# Patient Record
Sex: Male | Born: 1970 | Race: White | Hispanic: No | Smoking: Never smoker
Health system: Southern US, Community
[De-identification: ages and names within clinical notes are randomized; demographics above are authoritative.]

## PROBLEM LIST (undated history)

## (undated) DIAGNOSIS — F1010 Alcohol abuse, uncomplicated: Secondary | ICD-10-CM

## (undated) HISTORY — PX: KNEE SURGERY: SHX244

## (undated) HISTORY — PX: ANKLE SURGERY: SHX546

---

## 2016-05-22 DIAGNOSIS — L719 Rosacea, unspecified: Secondary | ICD-10-CM | POA: Insufficient documentation

## 2016-05-22 DIAGNOSIS — F419 Anxiety disorder, unspecified: Secondary | ICD-10-CM | POA: Insufficient documentation

## 2016-05-22 DIAGNOSIS — M25521 Pain in right elbow: Secondary | ICD-10-CM | POA: Insufficient documentation

## 2020-12-29 ENCOUNTER — Other Ambulatory Visit: Payer: Self-pay | Admitting: Chiropractic Medicine

## 2020-12-29 ENCOUNTER — Ambulatory Visit
Admission: RE | Admit: 2020-12-29 | Discharge: 2020-12-29 | Disposition: A | Discharge status: Home / Self Care | Payer: 59 | Source: Ambulatory Visit | Attending: Chiropractic Medicine | Admitting: Chiropractic Medicine

## 2020-12-29 DIAGNOSIS — T1490XA Injury, unspecified, initial encounter: Secondary | ICD-10-CM

## 2021-02-06 ENCOUNTER — Ambulatory Visit (HOSPITAL_COMMUNITY)
Admission: EM | Admit: 2021-02-06 | Discharge: 2021-02-06 | Disposition: A | Discharge status: Home / Self Care | Payer: No Payment, Other | Attending: Psychiatry | Admitting: Psychiatry

## 2021-02-06 ENCOUNTER — Other Ambulatory Visit: Payer: Self-pay

## 2021-02-06 DIAGNOSIS — Z1330 Encounter for screening examination for mental health and behavioral disorders, unspecified: Secondary | ICD-10-CM | POA: Insufficient documentation

## 2021-02-06 NOTE — BH Assessment (Signed)
Comprehensive Clinical Assessment (CCA) Screening, Triage and Referral Note  02/06/2021 Keith Miller [id number]  Chief Complaint:  Chief Complaint  Patient presents with  . Urgent Emergent Eval   Visit Diagnosis: F10.20, Alcohol use disorder, Moderate  Keith Miller is a 50 year old patient who was voluntarily brought to the [facility]) at the insistence of his sister and his son. Pt states, "I've had alcohol issues for a period of time. I went to rehab [at [facility]] for 1 month in October 2017 and it went well; I was able to not drink for several years. Recently I've been returning back to my old ways." He shares he also did 8 weeks of IOP after his stay at [facility]. Pt states he'd like referral information to assist him in focusing on why he drinks.  Pt denies SI at this time but acknowledges he's experienced SI in the past. He denies he's ever attempted to kill himself or that he's been hospitalized for mental health concerns. Pt denies he has a plan to kill himself at this time. Pt also denies current or a hx of HI or AVH.  Pt gave verbal consent for clinician to speak to his son, Keith Miller, who was in the waiting room. Keith Miller shared he is afraid of his father causing harm to either himself or others due to driving after he drinks. He shares that 3 weeks ago pt was drinking "a good bit" for a week straight and that one night he fell, got a minor concussion, and broke his nose. He states that, after this incident, he stopped drinking for 2 weeks, but that a friend passed away this week and he started drinking again. Pt's son states that, since the friend's passing, pt has been drinking every night; he shares pt also began drinking during the day today. Pt's son states, "I think he was happier when he wasn't drinking."  After clinician met with pt's son, pt requested to leave. Clinician requested time to get his paperwork together and for the NP to see him and  pt was agreeable, but a short time later pt was again saying he wanted to leave immediately, stating he needed to get his son home, as he has school tomorrow. Of note, pt was registered at 46 and pt began asking to leave shortly after 2000. Pt declined to wait to see the NP; clinician provided pt information re: the [facility]'s outpatient services and walk-in hours. Pt expressed an understanding and shared he had no more questions; pt was then taken to the lobby and left AMA.   Patient Reported Information How did you hear about [place]? Self   Referral name: Self   Referral phone number: 0 (N/A)  Whom do you see for routine medical problems? -- (N/A)   Practice/Facility Name: No data recorded  Practice/Facility Phone Number: No data recorded  Name of Contact: No data recorded  Contact Number: No data recorded  Contact Fax Number: No data recorded  Prescriber Name: No data recorded  Prescriber Address (if known): No data recorded What Is the Reason for Your Visit/Call Today? Pt states he has been drinking more regularly as of late; he has a hx of alcohol abuse  How Long Has This Been Causing You Problems? 1 wk - 1 month  Have You Recently Been in Any Inpatient Treatment (Hospital/Detox/Crisis Center/28-Day Program)? No   Name/Location of Program/Hospital:No data recorded  How Long Were You There? No data recorded  When Were You Discharged?  No data recorded Have You Ever Received Services From [facility] Before? No   Who Do You See at [facility]? No data recorded Have You Recently Had Any Thoughts About Hurting Yourself? No   Are You Planning to Commit Suicide/Harm Yourself At This time?  No  Have you Recently Had Thoughts About [place]? No   Explanation: No data recorded Have You Used Any Alcohol or Drugs in the Past 24 Hours? Yes   How Long Ago Did You Use Drugs or Alcohol?  No data recorded  What Did You Use and How Much? EtOH; unsure  What Do You Feel Would Help  You the Most Today? Therapy  Do You Currently Have a Therapist/Psychiatrist? No   Name of Therapist/Psychiatrist: No data recorded  Have You Been Recently Discharged From Any Office Practice or Programs? No   Explanation of Discharge From Practice/Program:  No data recorded    CCA Screening Triage Referral Assessment Type of Contact: Face-to-Face   Is this Initial or Reassessment? No data recorded  Date Telepsych consult ordered in [facility]:  No data recorded  Time Telepsych consult ordered in [facility]:  No data recorded Patient Reported Information Reviewed? Yes   Patient Left Without Being Seen? No data recorded  Reason for Not Completing Assessment: No data recorded Collateral Involvement: Pt gave verbal consent for clinician to speak to his son, Keith Miller; clinician spoke to pt's son and obtain collateral information.  Does Patient Have a Stage manager Guardian? No data recorded  Name and Contact of Legal Guardian:  No data recorded If Minor and Not Living with Parent(s), Who has Custody? N/A  Is CPS involved or ever been involved? Never  Is APS involved or ever been involved? Never  Patient Determined To Be At Risk for Harm To Self or Others Based on Review of Patient Reported Information or Presenting Complaint? No   Method: No data recorded  Availability of Means: No data recorded  Intent: No data recorded  Notification Required: No data recorded  Additional Information for Danger to Others Potential:  No data recorded  Additional Comments for Danger to Others Potential:  No data recorded  Are There Guns or Other Weapons in Your Home?  No data recorded   Types of Guns/Weapons: No data recorded   Are These Weapons Safely Secured?                              No data recorded   Who Could Verify You Are Able To Have These Secured:    No data recorded Do You Have any Outstanding Charges, Pending Court Dates, Parole/Probation? No data recorded Contacted To Inform of  Risk of Harm To Self or Others: -- (N/A)  Location of Assessment: [facility] Assessment Services  Does Patient Present under Involuntary Commitment? No   IVC Papers Initial File Date: No data recorded  [place] of Residence: [place]  Patient Currently Receiving the Following Services: Not Receiving Services   Determination of Need: No data recorded  Options For Referral: No data recorded  Dannielle Burn, LMFT

## 2021-02-06 NOTE — ED Provider Notes (Signed)
Behavioral Health Urgent Care Medical Screening Exam  Patient Name: Keith Miller MRN: [id number] Date of Evaluation: 02/06/21 Chief Complaint:   Diagnosis:  Final diagnoses:  None    History of Present illness: Keith Miller is a 50 y.o. male. Patient left prior to being seem by provider.   Per TTS: Pt denies SI at this time but acknowledges he's experienced SI in the past. He denies he's ever attempted to kill himself or that he's been hospitalized for mental health concerns. Pt denies he has a plan to kill himself at this time. Pt also denies current or a hx of HI or AVH.  No need to contact law enforcement for wellness check based on TTS assessment.   Maricela Bo, NP 02/06/2021, 11:29 PM

## 2021-02-14 ENCOUNTER — Other Ambulatory Visit: Payer: Self-pay | Admitting: Internal Medicine

## 2021-02-14 DIAGNOSIS — Z0000 Encounter for general adult medical examination without abnormal findings: Secondary | ICD-10-CM

## 2021-07-08 ENCOUNTER — Other Ambulatory Visit: Payer: Self-pay

## 2021-07-08 ENCOUNTER — Emergency Department (HOSPITAL_COMMUNITY)
Admission: EM | Admit: 2021-07-08 | Discharge: 2021-07-08 | Disposition: A | Discharge status: Home / Self Care | Payer: 59 | Attending: Emergency Medicine | Admitting: Emergency Medicine

## 2021-07-08 DIAGNOSIS — R45851 Suicidal ideations: Secondary | ICD-10-CM | POA: Diagnosis not present

## 2021-07-08 DIAGNOSIS — F32A Depression, unspecified: Secondary | ICD-10-CM | POA: Insufficient documentation

## 2021-07-08 DIAGNOSIS — Y908 Blood alcohol level of 240 mg/100 ml or more: Secondary | ICD-10-CM | POA: Insufficient documentation

## 2021-07-08 DIAGNOSIS — F1010 Alcohol abuse, uncomplicated: Secondary | ICD-10-CM | POA: Diagnosis not present

## 2021-07-08 DIAGNOSIS — F329 Major depressive disorder, single episode, unspecified: Secondary | ICD-10-CM

## 2021-07-08 DIAGNOSIS — Z20822 Contact with and (suspected) exposure to covid-19: Secondary | ICD-10-CM | POA: Insufficient documentation

## 2021-07-08 LAB — COMPREHENSIVE METABOLIC PANEL
ALT: 69 U/L — ABNORMAL HIGH (ref 0–44)
AST: 86 U/L — ABNORMAL HIGH (ref 15–41)
Albumin: 4.6 g/dL (ref 3.5–5.0)
Alkaline Phosphatase: 36 U/L — ABNORMAL LOW (ref 38–126)
Anion gap: 18 — ABNORMAL HIGH (ref 5–15)
BUN: 18 mg/dL (ref 6–20)
CO2: 22 mmol/L (ref 22–32)
Calcium: 8.7 mg/dL — ABNORMAL LOW (ref 8.9–10.3)
Chloride: 98 mmol/L (ref 98–111)
Creatinine, Ser: 1.03 mg/dL (ref 0.61–1.24)
GFR, Estimated: >60 mL/min (ref >60)
Glucose, Bld: 97 mg/dL (ref 70–99)
Potassium: 3.9 mmol/L (ref 3.5–5.1)
Sodium: 138 mmol/L (ref 135–145)
Total Bilirubin: 1.2 mg/dL (ref 0.3–1.2)
Total Protein: 7.4 g/dL (ref 6.5–8.1)

## 2021-07-08 LAB — RESP PANEL BY RT-PCR (FLU A&B, COVID) ARPGX2
Influenza A by PCR: NEGATIVE
Influenza B by PCR: NEGATIVE
SARS Coronavirus 2 by RT PCR: NEGATIVE

## 2021-07-08 LAB — RAPID URINE DRUG SCREEN, HOSP PERFORMED
Amphetamines: NOT DETECTED
Barbiturates: NOT DETECTED
Benzodiazepines: NOT DETECTED
Cocaine: NOT DETECTED
Opiates: NOT DETECTED
Tetrahydrocannabinol: NOT DETECTED

## 2021-07-08 LAB — CBC WITH DIFFERENTIAL/PLATELET
Abs Immature Granulocytes: 0.02 10*3/uL (ref 0.00–0.07)
Basophils Absolute: 0 10*3/uL (ref 0.0–0.1)
Basophils Relative: 1 %
Eosinophils Absolute: 0 10*3/uL (ref 0.0–0.5)
Eosinophils Relative: 0 %
HCT: 44.7 % (ref 39.0–52.0)
Hemoglobin: 15.8 g/dL (ref 13.0–17.0)
Immature Granulocytes: 0 %
Lymphocytes Relative: 13 %
Lymphs Abs: 1.1 10*3/uL (ref 0.7–4.0)
MCH: 31.7 pg (ref 26.0–34.0)
MCHC: 35.3 g/dL (ref 30.0–36.0)
MCV: 89.6 fL (ref 80.0–100.0)
Monocytes Absolute: 0.4 10*3/uL (ref 0.1–1.0)
Monocytes Relative: 5 %
Neutro Abs: 6.7 10*3/uL (ref 1.7–7.7)
Neutrophils Relative %: 81 %
Platelets: 286 10*3/uL (ref 150–400)
RBC: 4.99 MIL/uL (ref 4.22–5.81)
RDW: 13.1 % (ref 11.5–15.5)
WBC: 8.3 10*3/uL (ref 4.0–10.5)
nRBC: 0 % (ref 0.0–0.2)

## 2021-07-08 LAB — ETHANOL: Alcohol, Ethyl (B): 295 mg/dL — ABNORMAL HIGH (ref <10)

## 2021-07-08 MED ORDER — LORAZEPAM 1 MG PO TABS: 0.0000 mg | ORAL_TABLET | Freq: Four times a day (QID) | ORAL | Status: DC

## 2021-07-08 MED ORDER — CHLORDIAZEPOXIDE HCL 25 MG PO CAPS: ORAL_CAPSULE | ORAL | 0 refills | Status: DC

## 2021-07-08 MED ORDER — ESCITALOPRAM OXALATE 10 MG PO TABS
10.0000 mg | ORAL_TABLET | Freq: Every day | ORAL | Status: DC
  Administered 2021-07-08: 10 mg via ORAL
  Filled 2021-07-08: qty 1

## 2021-07-08 MED ORDER — LORAZEPAM 2 MG/ML IJ SOLN: 0.0000 mg | Freq: Two times a day (BID) | INTRAMUSCULAR | Status: DC

## 2021-07-08 MED ORDER — LORAZEPAM 2 MG/ML IJ SOLN
1.0000 mg | Freq: Once | INTRAMUSCULAR | Status: AC
  Administered 2021-07-08: 1 mg via INTRAVENOUS
  Filled 2021-07-08: qty 1

## 2021-07-08 MED ORDER — SODIUM CHLORIDE 0.9 % IV SOLN: Freq: Once | INTRAVENOUS | Status: AC

## 2021-07-08 MED ORDER — THIAMINE HCL 100 MG/ML IJ SOLN
100.0000 mg | Freq: Every day | INTRAMUSCULAR | Status: DC
  Administered 2021-07-08: 100 mg via INTRAVENOUS
  Filled 2021-07-08: qty 2

## 2021-07-08 MED ORDER — LORAZEPAM 2 MG/ML IJ SOLN: 0.0000 mg | Freq: Four times a day (QID) | INTRAMUSCULAR | Status: DC

## 2021-07-08 MED ORDER — THIAMINE HCL 100 MG PO TABS: 100.0000 mg | ORAL_TABLET | Freq: Every day | ORAL | Status: DC

## 2021-07-08 MED ORDER — LORAZEPAM 1 MG PO TABS: 0.0000 mg | ORAL_TABLET | Freq: Two times a day (BID) | ORAL | Status: DC

## 2021-07-08 NOTE — Discharge Instructions (Addendum)
As discussed, it is important that you drink alcohol only in moderation, monitor your condition carefully and do not hesitate to return here for concerning changes in your condition.  Otherwise follow-up with your physician and our behavioral health urgent care center to establish care with a mental health counselor.

## 2021-07-08 NOTE — ED Triage Notes (Addendum)
Pt requesting detox from ETOH. ETOH on board. No other complaints. VSS. Ambulatory in triage. CBG 88 w EMS.

## 2021-07-08 NOTE — ED Provider Notes (Signed)
[facility]-EMERGENCY DEPT Provider Note   CSN: [id number] Arrival date & time: 07/08/21  2013     History Chief Complaint  Patient presents with   Alcohol Intoxication   Requesting Detox    Keith Miller is a 50 y.o. male.  HPI Patient presents with request for assistance with alcohol abuse, depression. He notes that he is generally well, was down to about 1 week ago.  Not long after losing his job he began drinking excessively.  No drug use, cigarette use.  Since onset about 1 week ago he has been drinking copious amounts of alcohol and has developed despondency.  No discrete plan.  No focal pain.  He has been taking escitalopram as directed.  No clear alleviating or exacerbating factors.    Past medical depression, on escitalopram Social: Recent job loss, alcohol intake, no cigarettes, no drugs.  No family history on file.     Home Medications Prior to Admission medications   Not on File    Allergies    Patient has no allergy information on record.  Review of Systems   Review of Systems  Constitutional:        Per HPI, otherwise negative  HENT:         Per HPI, otherwise negative  Respiratory:         Per HPI, otherwise negative  Cardiovascular:        Per HPI, otherwise negative  Gastrointestinal:  Negative for vomiting.  Endocrine:       Negative aside from HPI  Genitourinary:        Neg aside from HPI   Musculoskeletal:        Per HPI, otherwise negative  Skin: Negative.   Neurological:  Negative for syncope.  Psychiatric/Behavioral:  Positive for dysphoric mood and suicidal ideas.    Physical Exam Updated Vital Signs BP (!) 154/96 (BP Location: Left Arm)   Pulse (!) 116   Temp 98.8 F (37.1 C) (Oral)   Resp 18   SpO2 96%   Physical Exam Vitals and nursing note reviewed.  Constitutional:      General: He is not in acute distress.    Appearance: He is well-developed.  HENT:     Head: Normocephalic and atraumatic.   Eyes:     Conjunctiva/sclera: Conjunctivae normal.  Cardiovascular:     Rate and Rhythm: Normal rate and regular rhythm.  Pulmonary:     Effort: Pulmonary effort is normal. No respiratory distress.     Breath sounds: No stridor.  Abdominal:     General: There is no distension.  Skin:    General: Skin is warm and dry.  Neurological:     Mental Status: He is alert and oriented to person, place, and time.  Psychiatric:     Comments: Depressed, but states that I would never commit suicide.    ED Results / Procedures / Treatments   Labs (all labs ordered are listed, but only abnormal results are displayed) Labs Reviewed  RESP PANEL BY RT-PCR (FLU A&B, COVID) [id number]  COMPREHENSIVE METABOLIC PANEL  ETHANOL  RAPID URINE DRUG SCREEN, HOSP PERFORMED  CBC WITH DIFFERENTIAL/PLATELET    EKG None  Radiology No results found.  Procedures Procedures   Medications Ordered in ED Medications  LORazepam (ATIVAN) injection 0-4 mg (has no administration in time range)    Or  LORazepam (ATIVAN) tablet 0-4 mg (has no administration in time range)  LORazepam (ATIVAN) injection 0-4 mg (has no administration  in time range)    Or  LORazepam (ATIVAN) tablet 0-4 mg (has no administration in time range)  thiamine tablet 100 mg (has no administration in time range)    Or  thiamine (B-1) injection 100 mg (has no administration in time range)  0.9 %  sodium chloride infusion (has no administration in time range)    ED Course  I have reviewed the triage vital signs and the nursing notes.  Pertinent labs & imaging results that were available during my care of the patient were reviewed by me and considered in my medical decision making (see chart for details).  Healthy adult male presents 1 week after job loss, now with excessive alcohol intake and new suicidal ideation.  He is awake, alert, speaking clearly, insightful into his presentation, likely precipitant of substantial life stress.   Patient had labs, started on CIWA protocol, received fluids.  11:02 PM Patient feeling better.  He has received fluids, Ativan.  He again reiterates that he is not interested in killing himself, acknowledges being in a difficult position in his life. We discussed importance of following up with behavioral health counselors, and alcohol cessation.  Patient amenable to starting Librium. Labs most notable for elevated alcohol level, the patient will have a ride home, but once he has fluids, Ativan, may be appropriate for discharge with outpatient behavioral follow-up.  Final Clinical Impression(s) / ED Diagnoses Final diagnoses:  ETOH abuse  Despondency     Gerhard Munch, MD 07/08/21 2304

## 2021-11-09 ENCOUNTER — Encounter (HOSPITAL_COMMUNITY): Payer: Self-pay | Admitting: Oncology

## 2021-11-09 ENCOUNTER — Emergency Department (HOSPITAL_COMMUNITY)
Admission: EM | Admit: 2021-11-09 | Discharge: 2021-11-09 | Disposition: A | Discharge status: Home / Self Care | Payer: 59 | Attending: Emergency Medicine | Admitting: Emergency Medicine

## 2021-11-09 ENCOUNTER — Other Ambulatory Visit: Payer: Self-pay

## 2021-11-09 DIAGNOSIS — R110 Nausea: Secondary | ICD-10-CM | POA: Insufficient documentation

## 2021-11-09 DIAGNOSIS — F1090 Alcohol use, unspecified, uncomplicated: Secondary | ICD-10-CM | POA: Diagnosis present

## 2021-11-09 DIAGNOSIS — Z5321 Procedure and treatment not carried out due to patient leaving prior to being seen by health care provider: Secondary | ICD-10-CM | POA: Insufficient documentation

## 2021-11-09 NOTE — ED Notes (Signed)
Called a total of 3 times.  Will d/c.

## 2021-11-09 NOTE — ED Notes (Signed)
Pt is not in triage room or lobby.  Will call for pt again.

## 2021-11-09 NOTE — ED Provider Notes (Signed)
Emergency Medicine Provider Triage Evaluation Note  Keith Miller , a 50 y.o. male  was evaluated in triage.  Pt for etoh detox. Last drink was 9 AM. Denies SI or HI. Denies AVH.   Review of Systems  Positive: nausea Negative: vomiting  Physical Exam  BP 120/77 (BP Location: Right Arm)   Pulse 79   Temp 98.3 F (36.8 C) (Oral)   Resp 17   Ht 6' (1.829 m)   Wt 90.7 kg   SpO2 99%   BMI 27.12 kg/m  Gen:   Awake, no distress   Resp:  Normal effort  MSK:   Moves extremities without difficulty  Other:  Clear speech  Medical Decision Making  Medically screening exam initiated at 3:35 PM.  Appropriate orders placed.  Keith Miller was informed that the remainder of the evaluation will be completed by another provider, this initial triage assessment does not replace that evaluation, and the importance of remaining in the ED until their evaluation is complete.     Keith Miller 11/09/21 1536    Keith Core, MD 11/10/21 [phone]

## 2021-11-09 NOTE — ED Triage Notes (Signed)
Pt reports binge drinking 3-4 half pints of vodka daily x one week.  Pt's last drink was this morning. Pt states he has had binges in the past however feels like he cannot come out of this one without assistance. Denies seizures in the past when abstaining from ETOH.

## 2021-11-11 ENCOUNTER — Ambulatory Visit (HOSPITAL_COMMUNITY)
Admission: EM | Admit: 2021-11-11 | Discharge: 2021-11-11 | Disposition: A | Discharge status: Home / Self Care | Payer: No Payment, Other | Attending: Psychiatry | Admitting: Psychiatry

## 2021-11-11 DIAGNOSIS — R450 Nervousness: Secondary | ICD-10-CM | POA: Insufficient documentation

## 2021-11-11 DIAGNOSIS — F1024 Alcohol dependence with alcohol-induced mood disorder: Secondary | ICD-10-CM | POA: Insufficient documentation

## 2021-11-11 DIAGNOSIS — F32A Depression, unspecified: Secondary | ICD-10-CM | POA: Insufficient documentation

## 2021-11-11 DIAGNOSIS — R4586 Emotional lability: Secondary | ICD-10-CM | POA: Insufficient documentation

## 2021-11-11 DIAGNOSIS — F1910 Other psychoactive substance abuse, uncomplicated: Secondary | ICD-10-CM | POA: Insufficient documentation

## 2021-11-11 NOTE — ED Provider Notes (Addendum)
Behavioral Health Urgent Care Medical Screening Exam  Patient Name: Keith Miller MRN: [id number] Date of Evaluation: 11/11/21 Chief Complaint:   Diagnosis:  Final diagnoses:  Alcohol dependence with alcohol-induced mood disorder (HCC)    History of Present illness: Keith Miller is a 50 y.o. male presents to [facility] urgent care accompanied by his sister.  Family is requesting that patient gets help to detox from alcohol.  Keith Miller reports drinking vodka daily. "  I do not know how much."  denying suicidal homicidal ideations.  Denies auditory visual hallucinations.  Denies history of delirious tremors or seizure withdrawal history.     Keith Miller was offered inpatient admission at our facility based crisis unit [facility])  however he declined.  Reported " I can not stay here for 5 days, I feel like shit, I just need something right now to help me."  Patient presents very irritable and labile.  NP attempted to discuss admission to facility based crisis in regards to detox/CIWA protocol.  Patient left this assessment.  We will make additional outpatient resources available.  Keith Miller, 50 y.o., male patient seen face to face by this provider and chart reviewed on 11/11/21.  On evaluation Keith Miller chart review patient denied that he is followed up with outpatient resources that he was provided in July.  Keith Miller reported " y all are the fucking professionals, y'all don't know what I need?"  Patient's sister apologized profusely stating that she never experienced patient acting this belligerent.  During evaluation Keith Miller is sitting  in no acute distress. He is alert/oriented x 4; irritable and labile; mood congruent with affect. He is speaking in a clear tone at moderate volume, and normal pace; with minimal eye contact.  His thought process is coherent and relevant; There is no indication that he is currently responding to internal/external stimuli or experiencing delusional  thought content; and he has denied suicidal/self-harm/homicidal ideation, psychosis, and paranoia.    At this time Keith Miller is educated and verbalizes understanding of mental health resources and other crisis services in the community. He is instructed to call 911 and present to the nearest emergency room should he experience any suicidal/homicidal ideation, auditory/visual/hallucinations, or detrimental worsening of his mental health condition. He was a also advised by Clinical research associate that he could call the toll-free phone on insurance card to assist with identifying in network counselors and agencies or number on back of Medicaid card to speak with care coordinator    Psychiatric Specialty Exam  Presentation  General Appearance:Appropriate for Environment  Eye Contact:Good  Speech:Clear and Coherent  Speech Volume:Normal  Handedness:Right   Mood and Affect  Mood:Depressed; Irritable; Labile  Affect:Congruent   Thought Process  Thought Processes:Coherent  Descriptions of Associations:Intact  Orientation:Full (Time, Place and Person)  Thought Content:Logical    Hallucinations:None  Ideas of Reference:None  Suicidal Thoughts:No  Homicidal Thoughts:No   Sensorium  Memory:Immediate Fair; Recent Fair; Remote Fair  Judgment:Fair  Insight:Fair   Executive Functions  Concentration:Fair  Attention Span:Fair  Recall:Fair  Fund of Knowledge:Fair  Language:Good   Psychomotor Activity  Psychomotor Activity:Normal   Assets  Assets:Desire for Improvement; Intimacy; Social Support   Sleep  Sleep:Fair  Number of hours: No data recorded  Nutritional Assessment (For OBS and FBC admissions only) Has the patient had a weight loss or gain of 10 pounds or more in the last 3 months?: No Has the patient had a decrease in food intake/or appetite?: No Does the patient  have dental problems?: No Does the patient have eating habits or behaviors that may be indicators  of an eating disorder including binging or inducing vomiting?: No Has the patient recently lost weight without trying?: 0 Has the patient been eating poorly because of a decreased appetite?: 0 Malnutrition Screening Tool Score: 0   Physical Exam: Physical Exam Vitals reviewed.  HENT:     Head: Normocephalic.  Pulmonary:     Effort: Pulmonary effort is normal.     Breath sounds: Normal breath sounds.  Neurological:     Mental Status: He is alert and oriented to person, place, and time.  Psychiatric:        Attention and Perception: Attention and perception normal.        Mood and Affect: Mood is anxious. Affect is labile.        Speech: Speech normal.        Behavior: Behavior is uncooperative and agitated.        Thought Content: Thought content normal. Thought content does not include suicidal ideation.        Cognition and Memory: Cognition normal.        Judgment: Judgment normal.   Review of Systems  HENT: Negative.    Eyes: Negative.   Cardiovascular: Negative.   Musculoskeletal: Negative.   Skin: Negative.   Endo/Heme/Allergies: Negative.   Psychiatric/Behavioral:  Positive for depression and substance abuse. Negative for suicidal ideas. The patient is nervous/anxious.   All other systems reviewed and are negative. Blood pressure 105/77, pulse 93, temperature 98.4 F (36.9 C), temperature source Oral, resp. rate 18, SpO2 99 %. There is no height or weight on file to calculate BMI.  Musculoskeletal: Strength & Muscle Tone: within normal limits Gait & Station: normal Patient leans: N/A   Jolly MSE Discharge Disposition for Follow up and Recommendations: Based on my evaluation the patient does not appear to have an emergency medical condition and can be discharged with resources and follow up care in outpatient services for Medication Management, Substance Abuse Intensive Outpatient Program, and Individual Therapy   Derrill [place], NP 11/11/2021, 4:35 PM

## 2021-11-11 NOTE — Discharge Instructions (Addendum)
Take all medications as prescribed. Keep all follow-up appointments as scheduled.  Do not consume alcohol or use illegal drugs while on prescription medications. Report any adverse effects from your medications to your primary care provider promptly.  In the event of recurrent symptoms or worsening symptoms, call 911, a crisis hotline, or go to the nearest emergency department for evaluation.   

## 2021-11-11 NOTE — Progress Notes (Signed)
Patient presents this date impaired asking for ETOH resources. Patient is brought in by his sister who assists with collateral information. Patient states he has been consuming different amounts of alcohol daily "forever" rendering limited history in reference to extended time frame and amounts consumed. Patient denies any S/I, H/I or AVH. Patient is observed to be agitated and guarded in reference to his history. Sister states patient was in a program at [facility] in in 2016 and maintained his sobriety for over three years being actively involved in [place]. Patient relapsed in 2019 and sister reports daily use since then. Patient was evaluated by Melvyn Neth NP who offered patient a admission into [facility] although patient declined stating he just was interested in OP resources. Patient was provided with area providers to assist with ongoing needs.

## 2021-11-11 NOTE — Discharge Summary (Signed)
Keith Miller to be D/C'd Home per NP order. Discussed with the patient's sister and all questions fully answered. An After Visit Summary was printed and given to the patient's sister. Patient escorted out, and D/C home via private auto.  [name]  11/11/2021 4:24 PM

## 2021-11-12 ENCOUNTER — Emergency Department (HOSPITAL_COMMUNITY)
Admission: EM | Admit: 2021-11-12 | Discharge: 2021-11-12 | Discharge status: Against Medical Advice | Payer: 59 | Source: Home / Self Care

## 2021-11-12 ENCOUNTER — Other Ambulatory Visit: Payer: Self-pay

## 2021-11-12 ENCOUNTER — Ambulatory Visit (HOSPITAL_COMMUNITY)
Admission: EM | Admit: 2021-11-12 | Discharge: 2021-11-12 | Disposition: A | Discharge status: Home / Self Care | Payer: 59

## 2021-11-12 DIAGNOSIS — F10129 Alcohol abuse with intoxication, unspecified: Secondary | ICD-10-CM

## 2021-11-12 NOTE — ED Notes (Signed)
Discharge instructions provided and Pt stated understanding. Pt alert, orient and ambulatory prior to d/c from facility. Safety maintained.   

## 2021-11-12 NOTE — ED Provider Notes (Signed)
Behavioral Health Urgent Care Medical Screening Exam  Patient Name: Keith Miller MRN: [id number] Date of Evaluation: 11/12/21 Chief Complaint:   Diagnosis:  Final diagnoses:  Alcohol abuse with intoxication (HCC)    History of Present illness: Keith Miller is a 50 y.o. male.  Presents to [facility] urgent care accompanied by his family members.  Patient read presents tonight and family is requesting detox from alcohol.  Keith Miller is irritable, belligerent and slightly aggressive throughout this assessment.  Keith Miller has declined resources for substance abuse.  Education was provided with following up with [facility] and/or [facility] recovery services and/or when patient is more receptive to treatment facility.   Keith Miller appears intoxicated as he reports his last drink was earlier today.  Patient is unsteady gait, slurred speech and belligerent.  Patient is not easily redirectable  -Patient left this assessment for the second night.  Patient refused services  Psychiatric Specialty Exam  Presentation  General Appearance:Appropriate for Environment  Eye Contact:Good  Speech:Clear and Coherent  Speech Volume:Normal  Handedness:Right   Mood and Affect  Mood:Depressed; Irritable; Labile  Affect:Congruent   Thought Process  Thought Processes:Coherent  Descriptions of Associations:Intact  Orientation:Full (Time, Place and Person)  Thought Content:Logical    Hallucinations:None  Ideas of Reference:None  Suicidal Thoughts:No  Homicidal Thoughts:No   Sensorium  Memory:Immediate Fair; Recent Fair; Remote Fair  Judgment:Fair  Insight:Fair   Executive Functions  Concentration:Fair  Attention Span:Fair  Recall:Fair  Fund of Knowledge:Fair  Language:Good   Psychomotor Activity  Psychomotor Activity:Normal   Assets  Assets:Desire for Improvement; Intimacy; Social Support   Sleep  Sleep:Fair  Number of hours: No data  recorded  Nutritional Assessment (For OBS and FBC admissions only) Has the patient had a weight loss or gain of 10 pounds or more in the last 3 months?: No Has the patient had a decrease in food intake/or appetite?: No Does the patient have dental problems?: No Does the patient have eating habits or behaviors that may be indicators of an eating disorder including binging or inducing vomiting?: No Has the patient recently lost weight without trying?: 0 Has the patient been eating poorly because of a decreased appetite?: 0 Malnutrition Screening Tool Score: 0    Physical Exam: Physical Exam Vitals reviewed.  Cardiovascular:     Rate and Rhythm: Normal rate and regular rhythm.     Pulses: Normal pulses.  Neurological:     Mental Status: He is oriented to person, place, and time.  Psychiatric:        Attention and Perception: Attention normal.        Mood and Affect: Mood normal.        Speech: Speech is slurred.        Behavior: Behavior is uncooperative.        Thought Content: Thought content normal.        Cognition and Memory: Cognition normal.        Judgment: Judgment normal.   Review of Systems  HENT: Negative.    Eyes: Negative.   Cardiovascular: Negative.   Gastrointestinal: Negative.   Skin: Negative.   Neurological: Negative.   Psychiatric/Behavioral:  Positive for substance abuse.   All other systems reviewed and are negative. There were no vitals taken for this visit. There is no height or weight on file to calculate BMI.  Musculoskeletal: Strength & Muscle Tone: within normal limits Gait & Station: normal Patient leans: N/A   [facility] MSE Discharge Disposition for  Follow up and Recommendations: Based on my evaluation the patient does not appear to have an emergency medical condition and can be discharged with resources and follow up care in outpatient services for Substance Abuse Intensive Outpatient Program   Oneta Rack, NP 11/12/2021, 4:55 PM

## 2021-11-12 NOTE — Discharge Instructions (Addendum)
Take all medications as prescribed. Keep all follow-up appointments as scheduled.  Do not consume alcohol or use illegal drugs while on prescription medications. Report any adverse effects from your medications to your primary care provider promptly.  In the event of recurrent symptoms or worsening symptoms, call 911, a crisis hotline, or go to the nearest emergency department for evaluation.   

## 2021-11-12 NOTE — BH Assessment (Signed)
Jens Siems, Urgent, MR [id number]; 51 years old presents this date with his sister, Felton Clinton, [phone].  Pt denied SI, HI, or AVH.  Pt sister reports that he using alcohol and needs detox.  Pt sister denies any prior MH diagnosis or prescribed medication for symptom management.  MSE signed by patient's sister.

## 2021-11-13 ENCOUNTER — Ambulatory Visit (HOSPITAL_COMMUNITY)
Admission: EM | Admit: 2021-11-13 | Discharge: 2021-11-13 | Disposition: A | Discharge status: Home / Self Care | Payer: 59 | Attending: Registered Nurse | Admitting: Registered Nurse

## 2021-11-13 DIAGNOSIS — F10229 Alcohol dependence with intoxication, unspecified: Secondary | ICD-10-CM | POA: Diagnosis not present

## 2021-11-13 MED ORDER — LORAZEPAM 1 MG PO TABS
2.0000 mg | ORAL_TABLET | Freq: Once | ORAL | Status: AC
  Administered 2021-11-13: 2 mg via ORAL

## 2021-11-13 MED ORDER — LORAZEPAM 1 MG PO TABS
ORAL_TABLET | ORAL | Status: AC
  Filled 2021-11-13: qty 2

## 2021-11-13 NOTE — Discharge Instructions (Signed)
Take all medications as prescribed. Keep all follow-up appointments as scheduled.  Do not consume alcohol or use illegal drugs while on prescription medications. Report any adverse effects from your medications to your primary care provider promptly.  In the event of recurrent symptoms or worsening symptoms, call 911, a crisis hotline, or go to the nearest emergency department for evaluation.   

## 2021-11-13 NOTE — Progress Notes (Signed)
   11/13/21 0939  [facility] Triage Screening (Walk-ins at [facility] only)  How Did You Hear About [place]? Self  What Is the Reason for Your Visit/Call Today? 50 year old requests assistance with ETOH issues, currently experiences withdrawals (self-report) and anxiety. Withdrawals; shaking, blue tongue, uncontrollable body movement, dry heaving and not eaten in 5 days. Started drinking in high, detox Oct. 2017 and relapse early 2020.  How Long Has This Been Causing You Problems? 1 wk - 1 month  Have You Recently Had Any Thoughts About Hurting Yourself? No  Are You Planning to Commit Suicide/Harm Yourself At This time? No  Have you Recently Had Thoughts About Hurting Someone Karolee Ohs? No  Are You Planning To Harm Someone At This Time? No  Are you currently experiencing any auditory, visual or other hallucinations? No  Have You Used Any Alcohol or Drugs in the Past 24 Hours? Yes  How long ago did you use Drugs or Alcohol? Last drink of alcohol 11/12/2021 around 4pm  What Did You Use and How Much? ETOH  Do you have any current medical co-morbidities that require immediate attention? No  Clinician description of patient physical appearance/behavior: Withdrawling  What Do You Feel Would Help You the Most Today? Alcohol or Drug Use Treatment  If access to [facility] was not available, would you have sought care in the Emergency Department? Yes  Determination of Need Routine (7 days)  Options For Referral Inpatient Hospitalization (substance abuse inpatient treatment)

## 2021-11-13 NOTE — ED Notes (Signed)
Patient was discharged to home. Patient was given discharge instructions.

## 2021-11-13 NOTE — ED Provider Notes (Signed)
Behavioral Health Urgent Care Medical Screening Exam  Patient Name: Keith Miller MRN: [id number] Date of Evaluation: 11/13/21 Chief Complaint: Alcohol addiction Diagnosis:  Final diagnoses:  Alcohol dependence with intoxication with complication (HCC)    History of Present illness: Keith Miller is a 50 y.o. male.  Presents to [facility] urgent care accompanied by his sister ([name]).  Patient third attempt for inpatient admission for our facility based crisis unit.  Family is requesting for patient to be detox from alcohol.  States he has not had anything to drink since yesterday afternoon.  Patient reports " I feel shaky and shitty."  Patient is offered inpatient admission for facility based mood stabilization in alcohol withdrawal symptoms.  Patient declines stating " I do not want to stay here, 5 days is to long."  NP discussed options for [facility], [facility] and/or alcohol drug services ([facility])  Sister reports patient has a balance at [facility] however will follow-up at this point.  Dawan continues to be resistant for treatment.  NP provided Ativan 2 mg for CIWA 10.  Encouraged emergency department follow-up.  Patient declined.  Sister reports patient is prescribed Lexapro and trazodone however is unsure if patient takes medication as indicated.  Support, encouragement and reassurance was provided.   Psychiatric Specialty Exam  Presentation  General Appearance:Appropriate for Environment  Eye Contact:Good  Speech:Clear and Coherent  Speech Volume:Normal  Handedness:Right   Mood and Affect  Mood:Depressed; Irritable; Labile  Affect:Congruent   Thought Process  Thought Processes:Coherent  Descriptions of Associations:Intact  Orientation:Full (Time, Place and Person)  Thought Content:Logical    Hallucinations:None  Ideas of Reference:None  Suicidal Thoughts:No  Homicidal Thoughts:No   Sensorium  Memory:Immediate Fair; Recent Fair; Remote  Fair  Judgment:Fair  Insight:Fair   Executive Functions  Concentration:Fair  Attention Span:Fair  Recall:Fair  Fund of Knowledge:Fair  Language:Good   Psychomotor Activity  Psychomotor Activity:Normal   Assets  Assets:Desire for Improvement; Intimacy; Social Support   Sleep  Sleep:Fair  Number of hours: No data recorded  No data recorded  Physical Exam: Physical Exam Vitals and nursing note reviewed.  HENT:     Head: Normocephalic.  Cardiovascular:     Rate and Rhythm: Normal rate and regular rhythm.  Pulmonary:     Effort: Pulmonary effort is normal.     Breath sounds: Normal breath sounds.  Skin:    General: Skin is dry.  Neurological:     Mental Status: He is alert and oriented to person, place, and time.  Psychiatric:        Attention and Perception: Attention and perception normal.        Mood and Affect: Mood is anxious.        Speech: Speech normal.        Behavior: Behavior is cooperative.        Thought Content: Thought content normal.        Cognition and Memory: Cognition and memory normal.   Review of Systems  Constitutional: Negative.   HENT: Negative.    Cardiovascular: Negative.   Gastrointestinal: Negative.   Genitourinary: Negative.   Musculoskeletal: Negative.   Neurological:  Positive for tremors and headaches.  Endo/Heme/Allergies: Negative.   Psychiatric/Behavioral:  Positive for depression and substance abuse. Negative for suicidal ideas. The patient is nervous/anxious.   All other systems reviewed and are negative. Blood pressure (!) 139/91, pulse 97, temperature 98.5 F (36.9 C), temperature source Oral, resp. rate 16, SpO2 97 %. There is no height or  weight on file to calculate BMI.  - 145/78 HR 88 temp 98.5 RR 20 O2 99% Musculoskeletal: Strength & Muscle Tone: within normal limits Gait & Station: normal Patient leans: N/A   [facility] MSE Discharge Disposition for Follow up and Recommendations: Based on my evaluation  the patient does not appear to have an emergency medical condition and can be discharged with resources and follow up care in outpatient services for Substance Abuse Intensive Outpatient Program  -Ativan 2 mg p.o.-patient to follow-up with [facility], [facility] and/or [facility] recovery services   Oneta Rack, NP 11/13/2021, 10:15 AM

## 2021-11-15 ENCOUNTER — Telehealth (HOSPITAL_COMMUNITY): Payer: Self-pay | Admitting: Internal Medicine

## 2021-11-15 NOTE — BH Assessment (Signed)
Care Management - Follow Up [facility] Discharges   Writer attempted to make contact with patient today and was unsuccessful.  Writer left a HIPPA compliant voice message.   Per chart review, patient was provided outpatient resources to follow-up with [facility], [facility] and/or [facility] recovery services

## 2021-12-05 ENCOUNTER — Emergency Department (HOSPITAL_COMMUNITY): Payer: 59

## 2021-12-05 ENCOUNTER — Emergency Department (HOSPITAL_COMMUNITY)
Admission: EM | Admit: 2021-12-05 | Discharge: 2021-12-06 | Disposition: A | Discharge status: Home / Self Care | Payer: 59 | Attending: Emergency Medicine | Admitting: Emergency Medicine

## 2021-12-05 DIAGNOSIS — W01198A Fall on same level from slipping, tripping and stumbling with subsequent striking against other object, initial encounter: Secondary | ICD-10-CM | POA: Diagnosis not present

## 2021-12-05 DIAGNOSIS — F10920 Alcohol use, unspecified with intoxication, uncomplicated: Secondary | ICD-10-CM

## 2021-12-05 DIAGNOSIS — Z79899 Other long term (current) drug therapy: Secondary | ICD-10-CM | POA: Diagnosis not present

## 2021-12-05 DIAGNOSIS — S0083XA Contusion of other part of head, initial encounter: Secondary | ICD-10-CM | POA: Diagnosis not present

## 2021-12-05 DIAGNOSIS — S0990XA Unspecified injury of head, initial encounter: Secondary | ICD-10-CM

## 2021-12-05 DIAGNOSIS — F10129 Alcohol abuse with intoxication, unspecified: Secondary | ICD-10-CM | POA: Insufficient documentation

## 2021-12-05 DIAGNOSIS — Y908 Blood alcohol level of 240 mg/100 ml or more: Secondary | ICD-10-CM | POA: Diagnosis not present

## 2021-12-05 LAB — CBC WITH DIFFERENTIAL/PLATELET
Abs Immature Granulocytes: 0.01 10*3/uL (ref 0.00–0.07)
Basophils Absolute: 0.1 10*3/uL (ref 0.0–0.1)
Basophils Relative: 1 %
Eosinophils Absolute: 0.2 10*3/uL (ref 0.0–0.5)
Eosinophils Relative: 3 %
HCT: 45.3 % (ref 39.0–52.0)
Hemoglobin: 15.4 g/dL (ref 13.0–17.0)
Immature Granulocytes: 0 %
Lymphocytes Relative: 36 %
Lymphs Abs: 2.6 10*3/uL (ref 0.7–4.0)
MCH: 32.6 pg (ref 26.0–34.0)
MCHC: 34 g/dL (ref 30.0–36.0)
MCV: 95.8 fL (ref 80.0–100.0)
Monocytes Absolute: 0.6 10*3/uL (ref 0.1–1.0)
Monocytes Relative: 8 %
Neutro Abs: 3.9 10*3/uL (ref 1.7–7.7)
Neutrophils Relative %: 52 %
Platelets: 301 10*3/uL (ref 150–400)
RBC: 4.73 MIL/uL (ref 4.22–5.81)
RDW: 13 % (ref 11.5–15.5)
WBC: 7.4 10*3/uL (ref 4.0–10.5)
nRBC: 0 % (ref 0.0–0.2)

## 2021-12-05 LAB — TYPE AND SCREEN
ABO/RH(D): A POS
Antibody Screen: NEGATIVE

## 2021-12-05 LAB — BASIC METABOLIC PANEL
Anion gap: 13 (ref 5–15)
BUN: 8 mg/dL (ref 6–20)
CO2: 21 mmol/L — ABNORMAL LOW (ref 22–32)
Calcium: 8.5 mg/dL — ABNORMAL LOW (ref 8.9–10.3)
Chloride: 105 mmol/L (ref 98–111)
Creatinine, Ser: 0.99 mg/dL (ref 0.61–1.24)
GFR, Estimated: >60 mL/min (ref >60)
Glucose, Bld: 109 mg/dL — ABNORMAL HIGH (ref 70–99)
Potassium: 4.5 mmol/L (ref 3.5–5.1)
Sodium: 139 mmol/L (ref 135–145)

## 2021-12-05 LAB — ABO/RH: ABO/RH(D): A POS

## 2021-12-05 LAB — ETHANOL: Alcohol, Ethyl (B): 411 mg/dL (ref <10)

## 2021-12-05 NOTE — ED Provider Notes (Signed)
[facility] EMERGENCY DEPARTMENT Provider Note   CSN: [id number] Arrival date & time: 12/05/21  2111     History No chief complaint on file.   Keith Miller is a 50 y.o. male.  50 year old male with prior medical history as detailed below presents for evaluation.  Patient with long history of alcohol use and abuse.  Today he arrives by EMS from a local bar.  Patient reported he was drinking.  He then fell off his barstool.  He struck his head.  EMS reports that he was initially somewhat responsive.  Upon arrival to the ED is comfortable.  He is clearly intoxicated.  Patient cannot recall the events leading up to his fall from a barstool.  Patient with abrasion and contusion to the right forehead.  Tetanus is UTD.  The history is provided by the patient.  Head Injury Location:  Frontal Mechanism of injury: fall   Fall:    Entrapped after fall: no   Pain details:    Severity:  Mild   Duration:  30 minutes   Timing:  Constant   Progression:  Unchanged Chronicity:  New     No past medical history on file.  There are no problems to display for this patient.   No past surgical history on file.     No family history on file.  Social History   Tobacco Use   Smoking status: Never   Smokeless tobacco: Never  Vaping Use   Vaping Use: Never used  Substance Use Topics   Alcohol use: Yes    Comment: 3-4 half pints of vodka QD   Drug use: Not Currently    Home Medications Prior to Admission medications   Medication Sig Start Date End Date Taking? Authorizing Provider  chlordiazePOXIDE (LIBRIUM) 25 MG capsule 50mg  PO TID x 1D, then 25-50mg  PO BID X 1D, then 25-50mg  PO QD X 1D 07/08/21   09/08/21, MD    Allergies    Patient has no known allergies.  Review of Systems   Review of Systems  All other systems reviewed and are negative.  Physical Exam Updated Vital Signs There were no vitals taken for this visit.  Physical Exam Vitals  and nursing note reviewed.  Constitutional:      General: He is not in acute distress.    Appearance: Normal appearance. He is well-developed.     Comments: Intoxicated  HENT:     Head: Normocephalic.     Comments: Ecchymosis and abrasion to right forehead Eyes:     Conjunctiva/sclera: Conjunctivae normal.     Pupils: Pupils are equal, round, and reactive to light.  Cardiovascular:     Rate and Rhythm: Normal rate and regular rhythm.     Heart sounds: Normal heart sounds.  Pulmonary:     Effort: Pulmonary effort is normal. No respiratory distress.     Breath sounds: Normal breath sounds.  Abdominal:     General: There is no distension.     Palpations: Abdomen is soft.     Tenderness: There is no abdominal tenderness.  Musculoskeletal:        General: No deformity. Normal range of motion.     Cervical back: Normal range of motion and neck supple.  Skin:    General: Skin is warm and dry.  Neurological:     General: No focal deficit present.     Mental Status: He is alert and oriented to person, place, and time.  ED Results / Procedures / Treatments   Labs (all labs ordered are listed, but only abnormal results are displayed) Labs Reviewed  ETHANOL - Abnormal; Notable for the following components:      Result Value   Alcohol, Ethyl (B) 411 (*)    All other components within normal limits  BASIC METABOLIC PANEL - Abnormal; Notable for the following components:   CO2 21 (*)    Glucose, Bld 109 (*)    Calcium 8.5 (*)    All other components within normal limits  CBC WITH DIFFERENTIAL/PLATELET  TYPE AND SCREEN  ABO/RH    EKG None  Radiology CT Head Wo Contrast  Result Date: 12/05/2021 CLINICAL DATA:  Larey Seat off bar stool ETOH on board EXAM: CT HEAD WITHOUT CONTRAST CT CERVICAL SPINE WITHOUT CONTRAST TECHNIQUE: Multidetector CT imaging of the head and cervical spine was performed following the standard protocol without intravenous contrast. Multiplanar CT image  reconstructions of the cervical spine were also generated. COMPARISON:  CT brain 12/29/2020 FINDINGS: CT HEAD FINDINGS Brain: No acute territorial infarction, hemorrhage, or intracranial mass. The ventricles are non enlarged. Vascular: No hyperdense vessels.  No unexpected calcification Skull: Normal. Negative for fracture or focal lesion. Sinuses/Orbits: No acute finding. Moderate mucosal thickening in the left maxillary sinus. Other: None CT CERVICAL SPINE FINDINGS Alignment: No subluxation.  Facet alignment within normal limits. Skull base and vertebrae: No acute fracture. No primary bone lesion or focal pathologic process. Soft tissues and spinal canal: No prevertebral fluid or swelling. No visible canal hematoma. Disc levels: Multilevel degenerative change. Moderate to marked disc space narrowing C6-C7. Facet degenerative changes at multiple levels with foraminal stenosis Upper chest: Negative. Other: Multiple tonsillar calcifications. IMPRESSION: 1. Negative non contrasted CT appearance of the brain. 2. Degenerative changes of the cervical spine. No acute osseous abnormality. Electronically Signed   By: Jasmine Pang M.D.   On: 12/05/2021 21:42   CT Cervical Spine Wo Contrast  Result Date: 12/05/2021 CLINICAL DATA:  Larey Seat off bar stool ETOH on board EXAM: CT HEAD WITHOUT CONTRAST CT CERVICAL SPINE WITHOUT CONTRAST TECHNIQUE: Multidetector CT imaging of the head and cervical spine was performed following the standard protocol without intravenous contrast. Multiplanar CT image reconstructions of the cervical spine were also generated. COMPARISON:  CT brain 12/29/2020 FINDINGS: CT HEAD FINDINGS Brain: No acute territorial infarction, hemorrhage, or intracranial mass. The ventricles are non enlarged. Vascular: No hyperdense vessels.  No unexpected calcification Skull: Normal. Negative for fracture or focal lesion. Sinuses/Orbits: No acute finding. Moderate mucosal thickening in the left maxillary sinus. Other:  None CT CERVICAL SPINE FINDINGS Alignment: No subluxation.  Facet alignment within normal limits. Skull base and vertebrae: No acute fracture. No primary bone lesion or focal pathologic process. Soft tissues and spinal canal: No prevertebral fluid or swelling. No visible canal hematoma. Disc levels: Multilevel degenerative change. Moderate to marked disc space narrowing C6-C7. Facet degenerative changes at multiple levels with foraminal stenosis Upper chest: Negative. Other: Multiple tonsillar calcifications. IMPRESSION: 1. Negative non contrasted CT appearance of the brain. 2. Degenerative changes of the cervical spine. No acute osseous abnormality. Electronically Signed   By: Jasmine Pang M.D.   On: 12/05/2021 21:42   DG Chest Port 1 View  Result Date: 12/05/2021 CLINICAL DATA:  Fall. EXAM: PORTABLE CHEST 1 VIEW COMPARISON:  None. FINDINGS: The heart size and mediastinal contours are within normal limits. Both lungs are clear. The visualized skeletal structures are unremarkable. IMPRESSION: No active disease. Electronically Signed   By: Linton Rump  Malena Edman M.D.   On: 12/05/2021 21:14    Procedures Procedures   Medications Ordered in ED Medications - No data to display  ED Course  I have reviewed the triage vital signs and the nursing notes.  Pertinent labs & imaging results that were available during my care of the patient were reviewed by me and considered in my medical decision making (see chart for details).    MDM Rules/Calculators/A&P                           MDM  MSE complete  Keith Miller was evaluated in Emergency Department on 12/05/2021 for the symptoms described in the history of present illness. He was evaluated in the context of the global COVID-19 pandemic, which necessitated consideration that the patient might be at risk for infection with the SARS-CoV-2 virus that causes COVID-19. Institutional protocols and algorithms that pertain to the evaluation of patients at risk for  COVID-19 are in a state of rapid change based on information released by regulatory bodies including the CDC and federal and state organizations. These policies and algorithms were followed during the patient's care in the ED.  Patient presents from a bar where he was drinking alcohol and then fell off a barstool.  He did sustain a head injury.  Patient is neurologically intact.  He is clinically intoxicated.  Alcohol level is 411.  CT imaging is without evidence of significant intracranial or C-spine injury.  Patient remains significantly intoxicated after CT.  He is responsive.  He still is unable to sit in a stable manner much less ambulate.  He will require additional period of observation prior to discharge.  Pending re-evaluation and dispo signed out to Dr. Bebe Shaggy.    Final Clinical Impression(s) / ED Diagnoses Final diagnoses:  Injury of head, initial encounter  Alcoholic intoxication without complication [facility])    Rx / DC Orders ED Discharge Orders     None        Wynetta Fines, MD 12/06/21 1454

## 2021-12-05 NOTE — ED Notes (Signed)
PT unable to sign MSE at this time.  I signed as refusal due to current mental status.  PT's wallet and necklace are at bedside near computer.

## 2021-12-05 NOTE — ED Triage Notes (Signed)
PT BIB [facility] from [employer] b/c he fell off a barstool and hit his head.  EMS reports that on arrival he was laying behind a table unresponsive.  EMS reports only obvious injury is a hematoma with mild abrasion to right forehead. Bar reports pt only had 2 beers per EMS.  PT is very sleepy and occasionally leans to side or tries to get up.  Judgment appears impaired at this time.  EMS started 18 G in L. AC in field.

## 2021-12-05 NOTE — ED Notes (Signed)
Trauma Response Nurse Note-  Reason for Call / Reason for Trauma activation:   - Level 2 fall with initial GCS of less than 15  Initial Focused Assessment (If applicable, or please see trauma documentation):  - pt is alert and opens eyes to voice when his eyes are closed. Hematoma to the head noted. No external hemorrhage noted  Interventions:  - IV access, blood work, portable x-ray, and CT head and c-spine obtained  Plan of Care as of this note:  - waiting on imaging to result  Event Summary:   - Pt came in as a level 2 trauma. EMS reports pt was at a bar and fell off a bar stool. EMS states pt was initially unresponsive, but became more responsive in transit. Pt alert but frequently closes his eyes, but will open his eyes when his name is called. Pt came in with IV access and second IV obtained. Blood work sent and portable chest x-ray obtained. Pt taken to CT and after the CT scan, pt attempted to get off the table by himself, but was redirectable.  Provider stated he will see if tdap is up to date, and if not, he will order it.

## 2021-12-05 NOTE — ED Notes (Signed)
Pt desatting while sleeping.  Snoring, sats up and down.  Pt placed on 2 L for support

## 2021-12-05 NOTE — ED Notes (Signed)
Call friend Barbara Cower number is in contacts for a ride when he gets home.

## 2021-12-05 NOTE — Discharge Instructions (Addendum)
Return for any problem.  Drink alcohol in moderation. 

## 2021-12-06 NOTE — ED Notes (Signed)
Per the doctor pt needs to ambulate. Tech gave pt instructions on walking. Pt ambulated without assistance around the nurses station no complaints were noted. Pt assisted back into bed. Bed locked low in a safe position call light within reach.

## 2021-12-06 NOTE — ED Provider Notes (Signed)
Pt improved He is awake/alert He is ambulatory BP 133/67   Pulse 73   Temp 99.2 F (37.3 C) (Oral)   Resp 16   SpO2 95%  He has safe ride home Pt is safe for d/c home    Zadie Rhine, MD 12/06/21 [phone]

## 2021-12-06 NOTE — ED Notes (Signed)
DC instructions reviewed with pt. Pt verbalized understanding.  PT DC.  

## 2022-01-02 ENCOUNTER — Encounter (HOSPITAL_COMMUNITY): Payer: Self-pay

## 2022-01-02 ENCOUNTER — Observation Stay (HOSPITAL_COMMUNITY)
Admission: EM | Admit: 2022-01-02 | Discharge: 2022-01-03 | Disposition: A | Discharge status: Home / Self Care | Payer: 59 | Attending: Internal Medicine | Admitting: Internal Medicine

## 2022-01-02 ENCOUNTER — Other Ambulatory Visit: Payer: Self-pay

## 2022-01-02 DIAGNOSIS — F10939 Alcohol use, unspecified with withdrawal, unspecified: Secondary | ICD-10-CM

## 2022-01-02 DIAGNOSIS — F10139 Alcohol abuse with withdrawal, unspecified: Principal | ICD-10-CM | POA: Insufficient documentation

## 2022-01-02 LAB — COMPREHENSIVE METABOLIC PANEL
ALT: 33 U/L (ref 0–44)
AST: 33 U/L (ref 15–41)
Albumin: 4.8 g/dL (ref 3.5–5.0)
Alkaline Phosphatase: 44 U/L (ref 38–126)
Anion gap: 14 (ref 5–15)
BUN: 6 mg/dL (ref 6–20)
CO2: 25 mmol/L (ref 22–32)
Calcium: 9.3 mg/dL (ref 8.9–10.3)
Chloride: 100 mmol/L (ref 98–111)
Creatinine, Ser: 0.9 mg/dL (ref 0.61–1.24)
GFR, Estimated: >60 mL/min (ref >60)
Glucose, Bld: 98 mg/dL (ref 70–99)
Potassium: 3.9 mmol/L (ref 3.5–5.1)
Sodium: 139 mmol/L (ref 135–145)
Total Bilirubin: 1 mg/dL (ref 0.3–1.2)
Total Protein: 7.1 g/dL (ref 6.5–8.1)

## 2022-01-02 LAB — CBC
HCT: 47.4 % (ref 39.0–52.0)
Hemoglobin: 16 g/dL (ref 13.0–17.0)
MCH: 31.9 pg (ref 26.0–34.0)
MCHC: 33.8 g/dL (ref 30.0–36.0)
MCV: 94.4 fL (ref 80.0–100.0)
Platelets: 248 10*3/uL (ref 150–400)
RBC: 5.02 MIL/uL (ref 4.22–5.81)
RDW: 12.9 % (ref 11.5–15.5)
WBC: 7.8 10*3/uL (ref 4.0–10.5)
nRBC: 0 % (ref 0.0–0.2)

## 2022-01-02 LAB — RAPID URINE DRUG SCREEN, HOSP PERFORMED
Amphetamines: NOT DETECTED
Barbiturates: NOT DETECTED
Benzodiazepines: NOT DETECTED
Cocaine: NOT DETECTED
Opiates: NOT DETECTED
Tetrahydrocannabinol: NOT DETECTED

## 2022-01-02 LAB — CBG MONITORING, ED: Glucose-Capillary: 101 mg/dL — ABNORMAL HIGH (ref 70–99)

## 2022-01-02 LAB — ETHANOL: Alcohol, Ethyl (B): 75 mg/dL — ABNORMAL HIGH (ref <10)

## 2022-01-02 MED ORDER — LORAZEPAM 2 MG/ML IJ SOLN: 0.0000 mg | Freq: Two times a day (BID) | INTRAMUSCULAR | Status: DC

## 2022-01-02 MED ORDER — LORAZEPAM 1 MG PO TABS: 0.0000 mg | ORAL_TABLET | Freq: Two times a day (BID) | ORAL | Status: DC

## 2022-01-02 MED ORDER — LORAZEPAM 1 MG PO TABS
0.0000 mg | ORAL_TABLET | Freq: Four times a day (QID) | ORAL | Status: DC
  Administered 2022-01-03: 1 mg via ORAL
  Filled 2022-01-02: qty 4

## 2022-01-02 MED ORDER — THIAMINE HCL 100 MG PO TABS
100.0000 mg | ORAL_TABLET | Freq: Every day | ORAL | Status: DC
  Administered 2022-01-03: 100 mg via ORAL
  Filled 2022-01-02: qty 1

## 2022-01-02 MED ORDER — LORAZEPAM 2 MG/ML IJ SOLN
2.0000 mg | Freq: Once | INTRAMUSCULAR | Status: AC
  Administered 2022-01-02: 2 mg via INTRAVENOUS
  Filled 2022-01-02: qty 1

## 2022-01-02 MED ORDER — THIAMINE HCL 100 MG/ML IJ SOLN
100.0000 mg | Freq: Every day | INTRAMUSCULAR | Status: DC
  Administered 2022-01-02: 100 mg via INTRAVENOUS
  Filled 2022-01-02: qty 2

## 2022-01-02 MED ORDER — ESCITALOPRAM OXALATE 10 MG PO TABS
5.0000 mg | ORAL_TABLET | Freq: Every day | ORAL | Status: DC
  Administered 2022-01-02: 5 mg via ORAL
  Filled 2022-01-02: qty 1

## 2022-01-02 MED ORDER — ONDANSETRON HCL 4 MG/2ML IJ SOLN
4.0000 mg | Freq: Once | INTRAMUSCULAR | Status: AC
  Administered 2022-01-02: 4 mg via INTRAVENOUS
  Filled 2022-01-02: qty 2

## 2022-01-02 MED ORDER — ONDANSETRON HCL 4 MG/2ML IJ SOLN: 4.0000 mg | Freq: Four times a day (QID) | INTRAMUSCULAR | Status: DC | PRN

## 2022-01-02 MED ORDER — SODIUM CHLORIDE 0.9 % IV BOLUS
1000.0000 mL | Freq: Once | INTRAVENOUS | Status: AC
  Administered 2022-01-02: 1000 mL via INTRAVENOUS

## 2022-01-02 MED ORDER — LORAZEPAM 2 MG/ML IJ SOLN
0.0000 mg | Freq: Four times a day (QID) | INTRAMUSCULAR | Status: DC
  Administered 2022-01-02: 1 mg via INTRAVENOUS
  Administered 2022-01-02: 2 mg via INTRAVENOUS
  Administered 2022-01-03: 1 mg via INTRAVENOUS
  Filled 2022-01-02 (×3): qty 1

## 2022-01-02 MED ORDER — ONDANSETRON HCL 4 MG PO TABS: 4.0000 mg | ORAL_TABLET | Freq: Four times a day (QID) | ORAL | Status: DC | PRN

## 2022-01-02 MED ORDER — ACETAMINOPHEN 325 MG PO TABS: 650.0000 mg | ORAL_TABLET | Freq: Four times a day (QID) | ORAL | Status: DC | PRN

## 2022-01-02 MED ORDER — ACETAMINOPHEN 650 MG RE SUPP: 650.0000 mg | Freq: Four times a day (QID) | RECTAL | Status: DC | PRN

## 2022-01-02 NOTE — ED Notes (Signed)
EKG given to Lauren,PA.  

## 2022-01-02 NOTE — ED Provider Notes (Signed)
Emergency Medicine Provider Triage Evaluation Note  Keith Miller , a 51 y.o. male  was evaluated in triage.  Pt complains of alcohol abuse and suicidal ideation.  He has a history of alcohol abuse had been through rehab.  Patient has been drinking greater than 1/5 a day for at least 7 days.  He gets weak and shaky when he stops drinking.  Last drink was just prior to arrival.  Patient is having suicidal ideation, no homicidal ideation or AVH  Review of Systems  Positive: Alcohol abuse and suicidal ideation Negative: Weakness numbness or homicidal ideation  Physical Exam  BP (!) 131/91 (BP Location: Right Arm)    Pulse 84    Temp 99.4 F (37.4 C) (Oral)    Resp 16    SpO2 98%  Gen:   Awake, no distress   Resp:  Normal effort  MSK:   Moves extremities without difficulty  Other:  No tremulousness  Medical Decision Making  Medically screening exam initiated at 12:45 PM.  Appropriate orders placed.  Lavin H Czarnecki was informed that the remainder of the evaluation will be completed by another provider, this initial triage assessment does not replace that evaluation, and the importance of remaining in the ED until their evaluation is complete.  Work-up initiated   Arthor Captain, PA-C 01/02/22 1247    Franne Forts, DO 01/03/22 1640

## 2022-01-02 NOTE — Progress Notes (Signed)
°   01/02/22 1736  TOC ED Mini Assessment  TOC Time spent with patient (minutes): 15  PING Used in TOC Assessment No  Admission or Readmission Diverted No  Interventions which prevented an admission or readmission Other (must enter comment) (SUD)  What brought you to the Emergency Department?  Detox  Barriers to Discharge Continued Medical Work up  [employer] CSW met with Pt and wife at bedside. Per Pt he previously completed inpatient SUD (alcohol) treatment at [facility]. Pt states that he has already been incontact with staff at [facility] and they can accept him in IOP program once he has completed detox and is medically stable. Pt has [employer]. Per Pt and wife, no other SUD resources/referrals are needed at this time.  EDP updated.

## 2022-01-02 NOTE — ED Provider Notes (Signed)
[facility] EMERGENCY DEPARTMENT Provider Note   CSN: [id number] Arrival date & time: 01/02/22  1144     History  Chief Complaint  Patient presents with   Z04.6    Keith Miller is a 51 y.o. male.  With past medical history of alcohol abuse who presents to the emergency department with alcohol withdrawal.  Patient states that over the past 7 days he has been on a "bender" with alcohol.  He states that he has been drinking 1pint to 1.5 pints of vodka a day.  Last drink at 10 AM this morning.  He currently feels like he is withdrawing.  He states that he has been very anxious and sweaty.  He is having a mild headache as well as nausea without vomiting.  He also endorses nonbloody diarrhea.  He denies chest pain, shortness of breath, abdominal pain.  He denies auditory, visual or tactile hallucinations.  He denies tobacco or other drug use.  He states that he has never had alcohol withdrawal seizures or been admitted for alcohol abuse before.  Denies ever having GI bleed previously.  Does not have a gastroenterologist.  Patient states that he has been drinking heavily over the past year since January 2022.  He states that he started drinking more heavily after he had a head injury and concussion.  No particular inciting event for this bender.  He has gone to [facility] and states that he was sober for about 3 and half years until the head injury.  Denies SI/HI.    HPI     Home Medications Prior to Admission medications   Medication Sig Start Date End Date Taking? Authorizing Provider  chlordiazePOXIDE (LIBRIUM) 25 MG capsule 50mg  PO TID x 1D, then 25-50mg  PO BID X 1D, then 25-50mg  PO QD X 1D 07/08/21   Carmin Muskrat, MD      Allergies    Patient has no known allergies.    Review of Systems   Review of Systems  Constitutional:  Positive for appetite change and diaphoresis.  Eyes:  Negative for visual disturbance.  Gastrointestinal:  Positive for  diarrhea and nausea. Negative for abdominal pain, blood in stool and vomiting.  Neurological:  Positive for tremors and headaches.  Psychiatric/Behavioral:  Negative for hallucinations and suicidal ideas. The patient is nervous/anxious.   All other systems reviewed and are negative.  Physical Exam Updated Vital Signs BP (!) 131/91 (BP Location: Right Arm)    Pulse 84    Temp 99.4 F (37.4 C) (Oral)    Resp 16    SpO2 98%  Physical Exam Vitals and nursing note reviewed.  Constitutional:      General: He is not in acute distress.    Appearance: Normal appearance. He is normal weight. He is ill-appearing and diaphoretic. He is not toxic-appearing.  HENT:     Head: Normocephalic and atraumatic.     Nose: Nose normal.     Mouth/Throat:     Mouth: Mucous membranes are moist.     Pharynx: Oropharynx is clear.     Comments: Tongue fasciculations present Eyes:     General: No scleral icterus.    Extraocular Movements: Extraocular movements intact.     Conjunctiva/sclera: Conjunctivae normal.     Pupils: Pupils are equal, round, and reactive to light.     Comments: Brief horizontal nystagmus  Cardiovascular:     Rate and Rhythm: Regular rhythm. Tachycardia present.     Pulses: Normal pulses.  Heart sounds: Normal heart sounds. No murmur heard. Pulmonary:     Effort: Pulmonary effort is normal. No respiratory distress.     Breath sounds: Normal breath sounds.  Abdominal:     General: Bowel sounds are normal. There is no distension.     Palpations: Abdomen is soft.     Tenderness: There is no abdominal tenderness.  Musculoskeletal:        General: Normal range of motion.     Cervical back: Neck supple.  Skin:    General: Skin is warm and moist.     Capillary Refill: Capillary refill takes less than 2 seconds.     Coloration: Skin is not jaundiced.  Neurological:     General: No focal deficit present.     Mental Status: He is alert and oriented to person, place, and time.      GCS: GCS eye subscore is 4. GCS verbal subscore is 5. GCS motor subscore is 6.     Sensory: Sensation is intact.     Motor: Tremor present.  Psychiatric:        Attention and Perception: He does not perceive auditory or visual hallucinations.        Mood and Affect: Mood is anxious. Affect is tearful.        Speech: Speech normal.        Behavior: Behavior normal. Behavior is cooperative.        Thought Content: Thought content normal. Thought content does not include homicidal or suicidal ideation.        Judgment: Judgment is impulsive.    ED Results / Procedures / Treatments   Labs (all labs ordered are listed, but only abnormal results are displayed) Labs Reviewed  ETHANOL - Abnormal; Notable for the following components:      Result Value   Alcohol, Ethyl (B) 75 (*)    All other components within normal limits  COMPREHENSIVE METABOLIC PANEL  CBC  RAPID URINE DRUG SCREEN, HOSP PERFORMED  HIV ANTIBODY (ROUTINE TESTING W REFLEX)  CBG MONITORING, ED   EKG EKG Interpretation  Date/Time:  Tuesday January 02 2022 16:09:39 EST Ventricular Rate:  84 PR Interval:  144 QRS Duration: 86 QT Interval:  364 QTC Calculation: 430 R Axis:   45 Text Interpretation: Normal sinus rhythm Normal ECG No previous ECGs available Confirmed by Nanda Quinton [phone]) on 01/02/2022 4:18:05 PM  Radiology No results found.  Procedures Procedures    Medications Ordered in ED Medications  LORazepam (ATIVAN) injection 0-4 mg (2 mg Intravenous Given 01/02/22 1625)    Or  LORazepam (ATIVAN) tablet 0-4 mg ( Oral See Alternative 01/02/22 1625)  LORazepam (ATIVAN) injection 0-4 mg (has no administration in time range)    Or  LORazepam (ATIVAN) tablet 0-4 mg (has no administration in time range)  thiamine tablet 100 mg ( Oral See Alternative 01/02/22 1624)    Or  thiamine (B-1) injection 100 mg (100 mg Intravenous Given 01/02/22 1624)  escitalopram (LEXAPRO) tablet 5 mg (has no administration in time range)   acetaminophen (TYLENOL) tablet 650 mg (has no administration in time range)    Or  acetaminophen (TYLENOL) suppository 650 mg (has no administration in time range)  ondansetron (ZOFRAN) tablet 4 mg (has no administration in time range)    Or  ondansetron (ZOFRAN) injection 4 mg (has no administration in time range)  sodium chloride 0.9 % bolus 1,000 mL (1,000 mLs Intravenous New Bag/Given 01/02/22 1625)  ondansetron (ZOFRAN) injection 4 mg (4  mg Intravenous Given 01/02/22 1623)  LORazepam (ATIVAN) injection 2 mg (2 mg Intravenous Given 01/02/22 1806)   ED Course/ Medical Decision Making/ A&P                           Medical Decision Making 51 year old male who presents to the emergency department in alcohol withdrawal.  Lab review EtOH 75 UDS gram of CBC and CMP without abnormalities EKG with normal sinus rhythm without QTC prolongation.  CIWA protocol ordered.  Initial CIWA score of 18.  He was given 2 mg IV Ativan with improvement in symptoms.  Also been given 100 mg IV thiamine, 1 L bolus and 4 mg IV Zofran for nausea. Seizure precautions ordered  Repeat CIWA 13, given 1 dose of 2 mg IV Ativan in addition to CIWA protocol.  Patient again declines SI/HI.  He has no history of alcohol withdrawal seizures or DTs.  He however has significantly increased CIWA score here requiring IV Ativan.  I have independently reviewed labs as noted above.  I have independently reviewed CIWA scoring by nursing.  Low suspicion that symptoms are caused by other organic etiology, concomitant ingestions.  Discussed with patient and sister at bedside that patient needs admission at this for alcohol withdrawal.  Requires cardiac monitoring and ongoing seizure precautions while he is physically withdrawing from alcohol. 1725: With Dr. Roosevelt Locks, hospitalist who agrees with admission at this time.  Final Clinical Impression(s) / ED Diagnoses Final diagnoses:  Alcohol abuse with withdrawal [facility])    Rx / DC  Orders ED Discharge Orders     None         Mickie Hillier, PA-C 01/02/22 1918    Margette Fast, MD 01/07/22 [phone]

## 2022-01-02 NOTE — ED Triage Notes (Signed)
Patient here requesting detox from ETOH. Has been drinking daily and heavy x 1 week. Patient denies drug use. Patient had beer just prior to arrival. Patient alert and oriented.

## 2022-01-02 NOTE — H&P (Signed)
History and Physical    Keith Miller [id number] DOB: March 13, 1971 DOA: 01/02/2022  PCP: Creola Corn, MD (Confirm with patient/family/NH records and if not entered, this has to be entered at [facility] point of entry) Patient coming from: Home  I have personally briefly reviewed patient's old medical records in [facility]  Chief Complaint: Tremor feeling anxious  HPI: Keith Miller is a 51 y.o. male with medical history significant of alcohol abuse, anxiety/depression presented with alcohol withdrawal symptoms.  Patient stopped drinking for about and pick up binge drinking for the last 7 days of 1 pint with every day and last drink this morning.  This afternoon, patient started to have a tremors sweating and feeling very anxious and came to the hospital.  Denied any nauseous vomiting abdominal pain.  No history of DTs or withdrawal seizure.  ED Course: Patient was found to have withdrawal symptoms and was given Ativan x3 and hospitalist called for admission.  Review of Systems: As per HPI otherwise 14 point review of systems negative.    History reviewed. No pertinent past medical history.  History reviewed. No pertinent surgical history.   reports that he has never smoked. He has never used smokeless tobacco. He reports current alcohol use. He reports that he does not currently use drugs.  No Known Allergies  No family history on file.   Prior to Admission medications   Medication Sig Start Date End Date Taking? Authorizing Provider  chlordiazePOXIDE (LIBRIUM) 25 MG capsule 50mg  PO TID x 1D, then 25-50mg  PO BID X 1D, then 25-50mg  PO QD X 1D 07/08/21  Yes 09/08/21, MD    Physical Exam: Vitals:   01/02/22 1617 01/02/22 1637 01/02/22 1755 01/02/22 1900  BP: (!) 154/94 (!) 151/83 140/76 134/78  Pulse: 87 86 87 85  Resp:  (!) 22 (!) 22 18  Temp:      TempSrc:      SpO2:  94% 99% 98%    Constitutional: NAD, calm, comfortable Vitals:   01/02/22 1617 01/02/22  1637 01/02/22 1755 01/02/22 1900  BP: (!) 154/94 (!) 151/83 140/76 134/78  Pulse: 87 86 87 85  Resp:  (!) 22 (!) 22 18  Temp:      TempSrc:      SpO2:  94% 99% 98%   Eyes: PERRL, lids and conjunctivae normal, appears anxious ENMT: Mucous membranes are moist. Posterior pharynx clear of any exudate or lesions.Normal dentition.  Neck: normal, supple, no masses, no thyromegaly Respiratory: clear to auscultation bilaterally, no wheezing, no crackles. Normal respiratory effort. No accessory muscle use.  Cardiovascular: Regular rate and rhythm, no murmurs / rubs / gallops. No extremity edema. 2+ pedal pulses. No carotid bruits.  Abdomen: no tenderness, no masses palpated. No hepatosplenomegaly. Bowel sounds positive.  Musculoskeletal: no clubbing / cyanosis. No joint deformity upper and lower extremities. Good ROM, no contractures. Normal muscle tone.  Skin: no rashes, lesions, ulcers. No induration Neurologic: CN 2-12 grossly intact. Sensation intact, DTR normal. Strength 5/5 in all 4.  Psychiatric: Normal judgment and insight. Alert and oriented x 3. Normal mood.   (  Labs on Admission: I have personally reviewed following labs and imaging studies  CBC: Recent Labs  Lab 01/02/22 1244  WBC 7.8  HGB 16.0  HCT 47.4  MCV 94.4  PLT 248   Basic Metabolic Panel: Recent Labs  Lab 01/02/22 1244  NA 139  K 3.9  CL 100  CO2 25  GLUCOSE 98  BUN 6  CREATININE 0.90  CALCIUM 9.3   GFR: CrCl cannot be calculated (Unknown ideal weight.). Liver Function Tests: Recent Labs  Lab 01/02/22 1244  AST 33  ALT 33  ALKPHOS 44  BILITOT 1.0  PROT 7.1  ALBUMIN 4.8   No results for input(s): LIPASE, AMYLASE in the last 168 hours. No results for input(s): AMMONIA in the last 168 hours. Coagulation Profile: No results for input(s): INR, PROTIME in the last 168 hours. Cardiac Enzymes: No results for input(s): CKTOTAL, CKMB, CKMBINDEX, TROPONINI in the last 168 hours. BNP (last 3  results) No results for input(s): PROBNP in the last 8760 hours. HbA1C: No results for input(s): HGBA1C in the last 72 hours. CBG: No results for input(s): GLUCAP in the last 168 hours. Lipid Profile: No results for input(s): CHOL, HDL, LDLCALC, TRIG, CHOLHDL, LDLDIRECT in the last 72 hours. Thyroid Function Tests: No results for input(s): TSH, T4TOTAL, FREET4, T3FREE, THYROIDAB in the last 72 hours. Anemia Panel: No results for input(s): VITAMINB12, FOLATE, FERRITIN, TIBC, IRON, RETICCTPCT in the last 72 hours. Urine analysis: No results found for: COLORURINE, APPEARANCEUR, LABSPEC, PHURINE, GLUCOSEU, HGBUR, BILIRUBINUR, KETONESUR, PROTEINUR, UROBILINOGEN, NITRITE, LEUKOCYTESUR  Radiological Exams on Admission: No results found.  EKG: Independently reviewed.  Sinus, no acute ST changes  Assessment/Plan Principal Problem:   Alcohol withdrawal (HCC) Active Problems:   Alcohol withdrawal syndrome with complication, with unspecified complication (HCC)  (please populate well all problems here in Problem List. (For example, if patient is on BP meds at home and you resume or decide to hold them, it is a problem that needs to be her. Same for CAD, COPD, HLD and so on)  Alcohol withdrawal -Responded to Ativan in the ED CIWA score was 18 in the ED, which improved to under 10 after Ativan given. -Continue CIWA protocol with Ativan tapering -Case management for alcohol outpatient rehab program.  Anxiety/depression -Continue Lexapro  DVT prophylaxis: SCD Code Status: Full code Family Communication: Wife at bedside Disposition Plan: Expect less than 2 midnight hospital stay Consults called: None Admission status: MedSurg observation   Emeline General MD [facility] Pager [phone]  01/02/2022, 7:51 PM

## 2022-01-03 DIAGNOSIS — F10939 Alcohol use, unspecified with withdrawal, unspecified: Secondary | ICD-10-CM | POA: Diagnosis not present

## 2022-01-03 LAB — HIV ANTIBODY (ROUTINE TESTING W REFLEX): HIV Screen 4th Generation wRfx: NONREACTIVE

## 2022-01-03 MED ORDER — CHLORDIAZEPOXIDE HCL 25 MG PO CAPS: ORAL_CAPSULE | ORAL | 0 refills | Status: DC

## 2022-01-03 NOTE — Progress Notes (Signed)
Patient discharged to home with instructions. 

## 2022-01-03 NOTE — Progress Notes (Signed)
Mobility Specialist Progress Note   01/03/22 1050  Mobility  Activity Ambulated in hall  Level of Assistance Independent after set-up  Assistive Device None  Distance Ambulated (ft) 1100 ft  Mobility Ambulated independently in hallway  Mobility Response Tolerated well  Mobility performed by Mobility specialist  $Mobility charge 1 Mobility   Received pt in bed having no complaints and agreeable to mobility. Asymptomatic throughout ambulation, returned back to bed w/ call bell by side and all needs met.  Holland Falling Mobility Specialist Phone Number [phone]

## 2022-01-03 NOTE — ED Notes (Signed)
Pt is resting at this time - has not slept throughout night - will obtain CIWA when pt is awake

## 2022-01-03 NOTE — Progress Notes (Signed)
Patient arrived to 6N17 from ED. Report received from [place], [place]. Patient alert and oriented x4. [name], and SisterSelena Batten, at bedside. Patient call bell within reach. Will continue to monitor.

## 2022-01-03 NOTE — Social Work (Signed)
CSW spoke to pt at bedside with sister present. CSW introduced self and explained role. Pt reports that his last drink of alcohol was yesterday. Pt reports he has been to [facility] in the past and would like to return for their IOP program.   CSW spoke with [facility], they reported that since pt has relapsed they would like for him to attend the inpatient 28 day program first, then transition to IOP upon completion.   CSW relayed information to pt. Pt stated that he did not want to do the inpatient program at this time. Pt reports he will "figure it out" when he gets home. PT was open to CSW resource packet.   CSW will sign off for now as social work intervention is no longer needed. Please consult [place] again if new needs arise.   Jimmy Picket, LCSW Clinical Social Worker

## 2022-01-03 NOTE — ED Notes (Signed)
6N called and asked to initiate purple man  °

## 2022-01-04 NOTE — Discharge Summary (Signed)
Physician Discharge Summary   Keith Miller Keith Miller UJW:[id number] DOB: 07/09/1971 DOA: 01/02/2022  PCP: Creola Cornusso, John, MD  Admit date: 01/02/2022 Discharge date: 01/03/2022   Admitted From: Home Disposition:  Home Discharging physician: Lewie Chamberavid Osher Oettinger, MD  Recommendations for Outpatient Follow-up:  Follow up etoh cessation and patient establishing with [facility]  Home Health:  Equipment/Devices:   Discharge Condition: stable CODE STATUS: Full Diet recommendation:  Diet Orders (From admission, onward)     Start     Ordered   01/03/22 0000  Diet general        01/03/22 1238            Hospital Course:  Mr. Keith Miller is a 51 yo male with PMH alcohol abuse, anxiety/depression who presented after an episode of binge drinking and alcohol withdrawal.  He has a history of intermittent episodes of significant alcohol abuse.  He was admitted for further alcohol detox and monitored on CIWA protocol.  He responded well to Ativan as needed and was able to be discharged home with short course of Librium.  He plans to follow-up outpatient with [facility] and plans to stay with family until able to get into their program.   The patient's chronic medical conditions were treated accordingly per the patient's home medication regimen except as noted.  On day of discharge, patient was felt deemed stable for discharge. Patient/family member advised to call PCP or come back to ER if needed.   Principal Diagnosis: Alcohol withdrawal (HCC)  Discharge Diagnoses: Principal Problem:   Alcohol withdrawal (HCC) Active Problems:   Alcohol withdrawal syndrome with complication, with unspecified complication [facility](HCC)   Discharge Instructions     Diet general   Complete by: As directed    Increase activity slowly   Complete by: As directed       Allergies as of 01/03/2022   No Known Allergies      Medication List     TAKE these medications    chlordiazePOXIDE 25 MG capsule Commonly known  as: LIBRIUM Take 2 tabs three times daily for 1 day, then 1 tab three times daily for 1 day, then 1 tab daily for 2 days What changed: additional instructions   escitalopram 20 MG tablet Commonly known as: LEXAPRO Take 20 mg by mouth daily.   SSS 10-5 10-5 % Crea Generic drug: Sulfacetamide Sodium-Sulfur Apply 1 application topically daily as needed (irritation).   traZODone 50 MG tablet Commonly known as: DESYREL Take 50 mg by mouth at bedtime as needed for sleep.        No Known Allergies  Consultations:   Discharge Exam: BP (!) 167/100 (BP Location: Right Arm)    Pulse 82    Temp 98.9 F (37.2 C) (Oral)    Resp 18    SpO2 98%  Physical Exam Constitutional:      General: He is not in acute distress.    Appearance: Normal appearance.  HENT:     Head: Normocephalic and atraumatic.     Mouth/Throat:     Mouth: Mucous membranes are moist.  Eyes:     Extraocular Movements: Extraocular movements intact.  Cardiovascular:     Rate and Rhythm: Normal rate and regular rhythm.     Heart sounds: Normal heart sounds.  Pulmonary:     Effort: Pulmonary effort is normal. No respiratory distress.     Breath sounds: Normal breath sounds. No wheezing.  Abdominal:     General: Bowel sounds are normal. There is no distension.  Palpations: Abdomen is soft.     Tenderness: There is no abdominal tenderness.  Musculoskeletal:        General: Normal range of motion.     Cervical back: Normal range of motion and neck supple.  Skin:    General: Skin is warm and dry.  Neurological:     General: No focal deficit present.     Mental Status: He is alert.  Psychiatric:        Mood and Affect: Mood normal.        Behavior: Behavior normal.     The results of significant diagnostics from this hospitalization (including imaging, microbiology, ancillary and laboratory) are listed below for reference.   Microbiology: No results found for this or any previous visit (from the past 240  hour(s)).   Labs: BNP (last 3 results) No results for input(s): BNP in the last 8760 hours. Basic Metabolic Panel: Recent Labs  Lab 01/02/22 1244  NA 139  K 3.9  CL 100  CO2 25  GLUCOSE 98  BUN 6  CREATININE 0.90  CALCIUM 9.3   Liver Function Tests: Recent Labs  Lab 01/02/22 1244  AST 33  ALT 33  ALKPHOS 44  BILITOT 1.0  PROT 7.1  ALBUMIN 4.8   No results for input(s): LIPASE, AMYLASE in the last 168 hours. No results for input(s): AMMONIA in the last 168 hours. CBC: Recent Labs  Lab 01/02/22 1244  WBC 7.8  HGB 16.0  HCT 47.4  MCV 94.4  PLT 248   Cardiac Enzymes: No results for input(s): CKTOTAL, CKMB, CKMBINDEX, TROPONINI in the last 168 hours. BNP: Invalid input(s): POCBNP CBG: Recent Labs  Lab 01/02/22 2208  GLUCAP 101*   D-Dimer No results for input(s): DDIMER in the last 72 hours. Hgb A1c No results for input(s): HGBA1C in the last 72 hours. Lipid Profile No results for input(s): CHOL, HDL, LDLCALC, TRIG, CHOLHDL, LDLDIRECT in the last 72 hours. Thyroid function studies No results for input(s): TSH, T4TOTAL, T3FREE, THYROIDAB in the last 72 hours.  Invalid input(s): FREET3 Anemia work up No results for input(s): VITAMINB12, FOLATE, FERRITIN, TIBC, IRON, RETICCTPCT in the last 72 hours. Urinalysis No results found for: COLORURINE, APPEARANCEUR, LABSPEC, PHURINE, GLUCOSEU, HGBUR, BILIRUBINUR, KETONESUR, PROTEINUR, UROBILINOGEN, NITRITE, LEUKOCYTESUR Sepsis Labs Invalid input(s): PROCALCITONIN,  WBC,  LACTICIDVEN Microbiology No results found for this or any previous visit (from the past 240 hour(s)).  Procedures/Studies: CT Head Wo Contrast  Result Date: 12/05/2021 CLINICAL DATA:  Larey Seat off bar stool ETOH on board EXAM: CT HEAD WITHOUT CONTRAST CT CERVICAL SPINE WITHOUT CONTRAST TECHNIQUE: Multidetector CT imaging of the head and cervical spine was performed following the standard protocol without intravenous contrast. Multiplanar CT image  reconstructions of the cervical spine were also generated. COMPARISON:  CT brain 12/29/2020 FINDINGS: CT HEAD FINDINGS Brain: No acute territorial infarction, hemorrhage, or intracranial mass. The ventricles are non enlarged. Vascular: No hyperdense vessels.  No unexpected calcification Skull: Normal. Negative for fracture or focal lesion. Sinuses/Orbits: No acute finding. Moderate mucosal thickening in the left maxillary sinus. Other: None CT CERVICAL SPINE FINDINGS Alignment: No subluxation.  Facet alignment within normal limits. Skull base and vertebrae: No acute fracture. No primary bone lesion or focal pathologic process. Soft tissues and spinal canal: No prevertebral fluid or swelling. No visible canal hematoma. Disc levels: Multilevel degenerative change. Moderate to marked disc space narrowing C6-C7. Facet degenerative changes at multiple levels with foraminal stenosis Upper chest: Negative. Other: Multiple tonsillar calcifications. IMPRESSION: 1. Negative  non contrasted CT appearance of the brain. 2. Degenerative changes of the cervical spine. No acute osseous abnormality. Electronically Signed   By: Jasmine Pang M.D.   On: 12/05/2021 21:42   CT Cervical Spine Wo Contrast  Result Date: 12/05/2021 CLINICAL DATA:  Larey Seat off bar stool ETOH on board EXAM: CT HEAD WITHOUT CONTRAST CT CERVICAL SPINE WITHOUT CONTRAST TECHNIQUE: Multidetector CT imaging of the head and cervical spine was performed following the standard protocol without intravenous contrast. Multiplanar CT image reconstructions of the cervical spine were also generated. COMPARISON:  CT brain 12/29/2020 FINDINGS: CT HEAD FINDINGS Brain: No acute territorial infarction, hemorrhage, or intracranial mass. The ventricles are non enlarged. Vascular: No hyperdense vessels.  No unexpected calcification Skull: Normal. Negative for fracture or focal lesion. Sinuses/Orbits: No acute finding. Moderate mucosal thickening in the left maxillary sinus. Other:  None CT CERVICAL SPINE FINDINGS Alignment: No subluxation.  Facet alignment within normal limits. Skull base and vertebrae: No acute fracture. No primary bone lesion or focal pathologic process. Soft tissues and spinal canal: No prevertebral fluid or swelling. No visible canal hematoma. Disc levels: Multilevel degenerative change. Moderate to marked disc space narrowing C6-C7. Facet degenerative changes at multiple levels with foraminal stenosis Upper chest: Negative. Other: Multiple tonsillar calcifications. IMPRESSION: 1. Negative non contrasted CT appearance of the brain. 2. Degenerative changes of the cervical spine. No acute osseous abnormality. Electronically Signed   By: Jasmine Pang M.D.   On: 12/05/2021 21:42   DG Chest Port 1 View  Result Date: 12/05/2021 CLINICAL DATA:  Fall. EXAM: PORTABLE CHEST 1 VIEW COMPARISON:  None. FINDINGS: The heart size and mediastinal contours are within normal limits. Both lungs are clear. The visualized skeletal structures are unremarkable. IMPRESSION: No active disease. Electronically Signed   By: Darliss Cheney M.D.   On: 12/05/2021 21:14     Time coordinating discharge: Over 30 minutes    Lewie Chamber, MD  [facility] 01/04/2022, 3:17 PM

## 2022-01-21 ENCOUNTER — Encounter (HOSPITAL_BASED_OUTPATIENT_CLINIC_OR_DEPARTMENT_OTHER): Payer: Self-pay | Admitting: Obstetrics and Gynecology

## 2022-01-21 ENCOUNTER — Emergency Department (HOSPITAL_BASED_OUTPATIENT_CLINIC_OR_DEPARTMENT_OTHER)
Admission: EM | Admit: 2022-01-21 | Discharge: 2022-01-22 | Disposition: A | Discharge status: Home / Self Care | Payer: 59 | Attending: Emergency Medicine | Admitting: Emergency Medicine

## 2022-01-21 ENCOUNTER — Other Ambulatory Visit: Payer: Self-pay

## 2022-01-21 ENCOUNTER — Emergency Department (HOSPITAL_BASED_OUTPATIENT_CLINIC_OR_DEPARTMENT_OTHER): Payer: 59

## 2022-01-21 DIAGNOSIS — Z20822 Contact with and (suspected) exposure to covid-19: Secondary | ICD-10-CM | POA: Insufficient documentation

## 2022-01-21 DIAGNOSIS — S0990XA Unspecified injury of head, initial encounter: Secondary | ICD-10-CM | POA: Insufficient documentation

## 2022-01-21 DIAGNOSIS — F10220 Alcohol dependence with intoxication, uncomplicated: Secondary | ICD-10-CM | POA: Insufficient documentation

## 2022-01-21 DIAGNOSIS — Y908 Blood alcohol level of 240 mg/100 ml or more: Secondary | ICD-10-CM | POA: Diagnosis not present

## 2022-01-21 DIAGNOSIS — W19XXXA Unspecified fall, initial encounter: Secondary | ICD-10-CM | POA: Diagnosis not present

## 2022-01-21 HISTORY — DX: Alcohol abuse, uncomplicated: F10.10

## 2022-01-21 LAB — MAGNESIUM: Magnesium: 2 mg/dL (ref 1.7–2.4)

## 2022-01-21 LAB — CBC WITH DIFFERENTIAL/PLATELET
Abs Immature Granulocytes: 0.01 10*3/uL (ref 0.00–0.07)
Basophils Absolute: 0.1 10*3/uL (ref 0.0–0.1)
Basophils Relative: 1 %
Eosinophils Absolute: 0 10*3/uL (ref 0.0–0.5)
Eosinophils Relative: 1 %
HCT: 47.8 % (ref 39.0–52.0)
Hemoglobin: 16.7 g/dL (ref 13.0–17.0)
Immature Granulocytes: 0 %
Lymphocytes Relative: 16 %
Lymphs Abs: 1.3 10*3/uL (ref 0.7–4.0)
MCH: 32 pg (ref 26.0–34.0)
MCHC: 34.9 g/dL (ref 30.0–36.0)
MCV: 91.6 fL (ref 80.0–100.0)
Monocytes Absolute: 0.4 10*3/uL (ref 0.1–1.0)
Monocytes Relative: 5 %
Neutro Abs: 6.4 10*3/uL (ref 1.7–7.7)
Neutrophils Relative %: 77 %
Platelets: 267 10*3/uL (ref 150–400)
RBC: 5.22 MIL/uL (ref 4.22–5.81)
RDW: 13 % (ref 11.5–15.5)
WBC: 8.2 10*3/uL (ref 4.0–10.5)
nRBC: 0 % (ref 0.0–0.2)

## 2022-01-21 LAB — COMPREHENSIVE METABOLIC PANEL
ALT: 51 U/L — ABNORMAL HIGH (ref 0–44)
AST: 68 U/L — ABNORMAL HIGH (ref 15–41)
Albumin: 4.7 g/dL (ref 3.5–5.0)
Alkaline Phosphatase: 37 U/L — ABNORMAL LOW (ref 38–126)
Anion gap: 17 — ABNORMAL HIGH (ref 5–15)
BUN: 14 mg/dL (ref 6–20)
CO2: 26 mmol/L (ref 22–32)
Calcium: 8.9 mg/dL (ref 8.9–10.3)
Chloride: 94 mmol/L — ABNORMAL LOW (ref 98–111)
Creatinine, Ser: 0.9 mg/dL (ref 0.61–1.24)
GFR, Estimated: >60 mL/min (ref >60)
Glucose, Bld: 107 mg/dL — ABNORMAL HIGH (ref 70–99)
Potassium: 4 mmol/L (ref 3.5–5.1)
Sodium: 137 mmol/L (ref 135–145)
Total Bilirubin: 0.9 mg/dL (ref 0.3–1.2)
Total Protein: 7.2 g/dL (ref 6.5–8.1)

## 2022-01-21 LAB — ETHANOL: Alcohol, Ethyl (B): 343 mg/dL (ref <10)

## 2022-01-21 MED ORDER — THIAMINE HCL 100 MG PO TABS: 100.0000 mg | ORAL_TABLET | Freq: Every day | ORAL | Status: DC

## 2022-01-21 MED ORDER — LORAZEPAM 2 MG/ML IJ SOLN: 0.0000 mg | Freq: Two times a day (BID) | INTRAMUSCULAR | Status: DC

## 2022-01-21 MED ORDER — SODIUM CHLORIDE 0.9 % IV BOLUS
1000.0000 mL | Freq: Once | INTRAVENOUS | Status: AC
  Administered 2022-01-21: 1000 mL via INTRAVENOUS

## 2022-01-21 MED ORDER — LORAZEPAM 1 MG PO TABS: 0.0000 mg | ORAL_TABLET | Freq: Four times a day (QID) | ORAL | Status: DC

## 2022-01-21 MED ORDER — ONDANSETRON HCL 4 MG/2ML IJ SOLN
4.0000 mg | Freq: Once | INTRAMUSCULAR | Status: AC
  Administered 2022-01-21: 4 mg via INTRAVENOUS
  Filled 2022-01-21: qty 2

## 2022-01-21 MED ORDER — THIAMINE HCL 100 MG/ML IJ SOLN: 100.0000 mg | Freq: Every day | INTRAMUSCULAR | Status: DC

## 2022-01-21 MED ORDER — LORAZEPAM 1 MG PO TABS: 0.0000 mg | ORAL_TABLET | Freq: Two times a day (BID) | ORAL | Status: DC

## 2022-01-21 MED ORDER — LORAZEPAM 2 MG/ML IJ SOLN
0.0000 mg | Freq: Four times a day (QID) | INTRAMUSCULAR | Status: DC
  Administered 2022-01-21 – 2022-01-22 (×2): 1 mg via INTRAVENOUS
  Filled 2022-01-21 (×2): qty 1

## 2022-01-21 NOTE — ED Notes (Signed)
Etoh level 343. Dr. [facility] Aye aware.

## 2022-01-21 NOTE — ED Provider Notes (Signed)
[facility] EMERGENCY DEPT Provider Note   CSN: [id number] Arrival date & time: 01/21/22  2205     History  Chief Complaint  Patient presents with   Alcohol Intoxication    Keith Miller is a 51 y.o. male.  HPI     This is a 51 year old male with a history of alcohol abuse who presents with alcohol intoxication and requesting detox.  Patient states that he has been binge drinking for the last 1 week.  He reports that he recently went to [facility] but "I have a lot going on and was not successful."  He has never had DTs before or alcohol withdrawal seizures.  Patient last drink 2 hours ago.  He is currently drinking up to 3 fifths of vodka per day.  He did fall and hit his head on Friday night.  He does not recall events leading up to the fall.  Since that time he has had some nausea and vomiting.  States he feels nauseous now.  Denies HI or SI.  Home Medications Prior to Admission medications   Medication Sig Start Date End Date Taking? Authorizing Provider  chlordiazePOXIDE (LIBRIUM) 25 MG capsule 50mg  PO TID x 1D, then 25-50mg  PO BID X 1D, then 25-50mg  PO QD X 1D 01/22/22  Yes Berkley Cronkright, Mayer Masker, MD  chlorproMAZINE (THORAZINE) 25 MG tablet Take 1 tablet (25 mg total) by mouth 3 (three) times daily as needed for hiccoughs. 01/22/22  Yes Judy Goodenow, Mayer Masker, MD  ondansetron (ZOFRAN-ODT) 4 MG disintegrating tablet Take 1 tablet (4 mg total) by mouth every 8 (eight) hours as needed for nausea or vomiting. 01/22/22  Yes Treg Diemer, Mayer Masker, MD  escitalopram (LEXAPRO) 20 MG tablet Take 20 mg by mouth daily. 12/11/21   [provider]  SSS 10-5 10-5 % CREA Apply 1 application topically daily as needed (irritation). 12/13/21   [provider]  traZODone (DESYREL) 50 MG tablet Take 50 mg by mouth at bedtime as needed for sleep. 12/11/21   [provider]      Allergies    Patient has no known allergies.    Review of Systems   Review of  Systems  Constitutional:  Negative for fever.  Respiratory:  Negative for shortness of breath.   Cardiovascular:  Negative for chest pain.  Gastrointestinal:  Positive for nausea and vomiting. Negative for abdominal pain.  Genitourinary:  Negative for dysuria.  All other systems reviewed and are negative.  Physical Exam Updated Vital Signs BP 139/81    Pulse 93    Temp 97.8 F (36.6 C)    Resp 17    Ht 1.829 m (6')    Wt 95.3 kg    SpO2 95%    BMI 28.48 kg/m  Physical Exam Vitals and nursing note reviewed.  Constitutional:      Appearance: He is well-developed. He is not ill-appearing.  HENT:     Head: Normocephalic and atraumatic.     Mouth/Throat:     Mouth: Mucous membranes are dry.  Eyes:     Pupils: Pupils are equal, round, and reactive to light.  Cardiovascular:     Rate and Rhythm: Normal rate and regular rhythm.     Heart sounds: Normal heart sounds. No murmur heard. Pulmonary:     Effort: Pulmonary effort is normal. No respiratory distress.     Breath sounds: Normal breath sounds. No wheezing.  Abdominal:     Palpations: Abdomen is soft.     Tenderness:  There is no abdominal tenderness. There is no rebound.  Musculoskeletal:     Cervical back: Neck supple.     Right lower leg: No edema.     Left lower leg: No edema.  Lymphadenopathy:     Cervical: No cervical adenopathy.  Skin:    General: Skin is warm and dry.  Neurological:     Mental Status: He is alert and oriented to person, place, and time.  Psychiatric:     Comments: Tearful at times    ED Results / Procedures / Treatments   Labs (all labs ordered are listed, but only abnormal results are displayed) Labs Reviewed  COMPREHENSIVE METABOLIC PANEL - Abnormal; Notable for the following components:      Result Value   Chloride 94 (*)    Glucose, Bld 107 (*)    AST 68 (*)    ALT 51 (*)    Alkaline Phosphatase 37 (*)    Anion gap 17 (*)    All other components within normal limits  ETHANOL -  Abnormal; Notable for the following components:   Alcohol, Ethyl (B) 343 (*)    All other components within normal limits  RESP PANEL BY RT-PCR (FLU A&B, COVID) ARPGX2  CBC WITH DIFFERENTIAL/PLATELET  MAGNESIUM    EKG EKG Interpretation  Date/Time:  Sunday January 21 2022 23:02:57 EST Ventricular Rate:  92 PR Interval:  144 QRS Duration: 84 QT Interval:  343 QTC Calculation: 425 R Axis:   37 Text Interpretation: Sinus rhythm Confirmed by Ross Marcus ([id number]) on 01/21/2022 11:31:23 PM  Radiology CT Head Wo Contrast  Result Date: 01/21/2022 CLINICAL DATA:  Headache, new or worsening (Age >= 50y) EXAM: CT HEAD WITHOUT CONTRAST TECHNIQUE: Contiguous axial images were obtained from the base of the skull through the vertex without intravenous contrast. RADIATION DOSE REDUCTION: This exam was performed according to the departmental dose-optimization program which includes automated exposure control, adjustment of the mA and/or kV according to patient size and/or use of iterative reconstruction technique. COMPARISON:  None. FINDINGS: Brain: Normal anatomic configuration. No abnormal intra or extra-axial mass lesion or fluid collection. No abnormal mass effect or midline shift. No evidence of acute intracranial hemorrhage or infarct. Ventricular size is normal. Cerebellum unremarkable. Vascular: Unremarkable Skull: Intact Sinuses/Orbits: Paranasal sinuses are clear. Orbits are unremarkable. Other: Mastoid air cells and middle ear cavities are clear. IMPRESSION: No acute intracranial abnormality.  Normal examination. Electronically Signed   By: Helyn Numbers M.D.   On: 01/21/2022 23:47    Procedures .Critical Care Performed by: Shon Baton, MD Authorized by: Shon Baton, MD   Critical care provider statement:    Critical care time (minutes):  60   Critical care was necessary to treat or prevent imminent or life-threatening deterioration of the following conditions: Alcohol  intoxication and withdrawal.   Critical care was time spent personally by me on the following activities:  Development of treatment plan with patient or surrogate, discussions with consultants, evaluation of patient's response to treatment, examination of patient, ordering and review of laboratory studies, ordering and review of radiographic studies, ordering and performing treatments and interventions, pulse oximetry, re-evaluation of patient's condition and review of old charts    Medications Ordered in ED Medications  LORazepam (ATIVAN) injection 0-4 mg (1 mg Intravenous Given 01/22/22 0453)    Or  LORazepam (ATIVAN) tablet 0-4 mg ( Oral See Alternative 01/22/22 0453)  LORazepam (ATIVAN) injection 0-4 mg (has no administration in time range)    Or  LORazepam (ATIVAN) tablet 0-4 mg (has no administration in time range)  thiamine tablet 100 mg (has no administration in time range)    Or  thiamine (B-1) injection 100 mg (has no administration in time range)  chlordiazePOXIDE (LIBRIUM) capsule 50 mg (has no administration in time range)  chlorproMAZINE (THORAZINE) tablet 25 mg (has no administration in time range)  sodium chloride 0.9 % bolus 1,000 mL (0 mLs Intravenous Stopped 01/22/22 0047)  ondansetron (ZOFRAN) injection 4 mg (4 mg Intravenous Given 01/21/22 2343)  chlorproMAZINE (THORAZINE) tablet 25 mg (25 mg Oral Given 01/22/22 0137)  LORazepam (ATIVAN) injection 1 mg (1 mg Intravenous Given 01/22/22 0141)    ED Course/ Medical Decision Making/ A&P Clinical Course as of 01/22/22 0521  Mon Jan 22, 2022  0115 I had a long conversation with the patient and his friend at the bedside.  Previous admission he was in acute withdrawal with a CIWA score of greater than 15.  He declined inpatient.  All treatment at that time.  Currently he is acutely intoxicated with an alcohol level of 343.  CIWA score is 6 but I do not believe he is acutely withdrawing at this time.  No history of DTs or seizure.   Discussed with him that he does not meet criteria for medical admission.  Have offered behavioral health evaluation and/or Librium taper as an outpatient with follow-up with outpatient rehabilitation programs.  Patient is complaining mostly of hiccups.  He was given an additional dose of Thorazine and Ativan.  He was observed.  I allowed his friend respite to go home.  He will pick them up first thing in the morning.  We will continue to monitor closely. [CH]  0158 Alcohol, Ethyl (B)(!!): 343 [CH]  0505 Repeat CIWA score 7.  Patient given an additional dose of Ativan.  Feel he is safe for outpatient Librium and referral to detox/rehab.  Friend is at the bedside. [CH]    Clinical Course User Index [CH] Dmiyah Liscano, Mayer Maskerourtney F, MD                           Medical Decision Making Amount and/or Complexity of Data Reviewed Labs:  Decision-making details documented in ED Course.  Risk OTC drugs. Prescription drug management.   This patient presents to the ED for concern of alcohol abuse, intoxication, this involves an extensive number of treatment options, and is a complaint that carries with it a high risk of complications and morbidity.  The differential diagnosis includes acute intoxication, withdrawal  MDM:    This is a 51 year old male who presents requesting detox.  He is acutely intoxicated.  Recent admission to the hospital with CIWA scores greater than 15 requiring medical admission.  He ultimately was discharged but never picked up his Librium.  He continues to drink.  He is not suicidal or homicidal.  He does not appear to be acutely withdrawing on initial presentation.  Alcohol level greater than 300.  Has pertinent labs notable for slightly elevated LFTs.  He does have an anion gap of 17.  This is likely related to nausea, vomiting, mild AKA.  He was given fluids.  He was given Ativan per CIWA protocol.  CT head negative for acute traumatic injury.  He was observed overnight.  He did not meet  medical admission and on last recheck, CIWA score of 7.  Discussed with the patient that he did not meet medical admission.  I do however  encourage seeking out rehabilitation options as an outpatient.  He was provided with resources.  Friend is at the bedside and is supportive.  Will discharge with Librium taper. (Labs, imaging)  Labs: I Ordered, and personally interpreted labs.  The pertinent results include: Elevated LFTs  Imaging Studies ordered: I ordered imaging studies including CT head negative for acute injury I independently visualized and interpreted imaging. I agree with the radiologist interpretation  Additional history obtained from friend and family.  External records from outside source obtained and reviewed including prior ED visit  Critical Interventions: IV Ativan for withdrawal symptoms  Consultations: I requested consultation with the none,  and discussed lab and imaging findings as well as pertinent plan - they recommend: None  Cardiac Monitoring: The patient was maintained on a cardiac monitor.  I personally viewed and interpreted the cardiac monitored which showed an underlying rhythm of: Normal sinus rhythm  Reevaluation: After the interventions noted above, I reevaluated the patient and found that they have :stayed the same   Considered admission for: Acute alcohol withdrawal  Social Determinants of Health: Has family and friends support at the bedside  Disposition: Discharge with Librium taper  Co morbidities that complicate the patient evaluation  Past Medical History:  Diagnosis Date   ETOH abuse      Medicines Meds ordered this encounter  Medications   OR Linked Order Group    LORazepam (ATIVAN) injection 0-4 mg     Order Specific Question:   CIWA-AR < 5 =     Answer:   0 mg     Order Specific Question:   CIWA-AR 5 -10 =     Answer:   1 mg     Order Specific Question:   CIWA-AR 11 -15 =     Answer:   2 mg     Order Specific Question:    CIWA-AR 16 -24 =     Answer:   2 mg     Order Specific Question:   CIWA-AR 16 -24 =     Answer:   Recheck CIWA-AR in 1 hour; if > 15 notify MD     Order Specific Question:   CIWA-AR > 24 =     Answer:   4 mg     Order Specific Question:   CIWA-AR > 24 =     Answer:   Call Rapid Response    LORazepam (ATIVAN) tablet 0-4 mg     Order Specific Question:   CIWA-AR < 5 =     Answer:   0 mg     Order Specific Question:   CIWA-AR 5 -10 =     Answer:   1 mg     Order Specific Question:   CIWA-AR 11 -15 =     Answer:   2 mg     Order Specific Question:   CIWA-AR 16 -24 =     Answer:   2 mg     Order Specific Question:   CIWA-AR 16 -24 =     Answer:   Recheck CIWA-AR in 1 hour; if > 15 notify MD     Order Specific Question:   CIWA-AR > 24 =     Answer:   4 mg     Order Specific Question:   CIWA-AR > 24 =     Answer:   Call Rapid Response   OR Linked Order Group    LORazepam (ATIVAN) injection 0-4 mg     Order  Specific Question:   CIWA-AR < 5 =     Answer:   0 mg     Order Specific Question:   CIWA-AR 5 -10 =     Answer:   1 mg     Order Specific Question:   CIWA-AR 11 -15 =     Answer:   2 mg     Order Specific Question:   CIWA-AR 16 -24 =     Answer:   2 mg     Order Specific Question:   CIWA-AR 16 -24 =     Answer:   Recheck CIWA-AR in 1 hour; if > 15 notify MD     Order Specific Question:   CIWA-AR > 24 =     Answer:   4 mg     Order Specific Question:   CIWA-AR > 24 =     Answer:   Call Rapid Response    LORazepam (ATIVAN) tablet 0-4 mg     Order Specific Question:   CIWA-AR < 5 =     Answer:   0 mg     Order Specific Question:   CIWA-AR 5 -10 =     Answer:   1 mg     Order Specific Question:   CIWA-AR 11 -15 =     Answer:   2 mg     Order Specific Question:   CIWA-AR 16 -24 =     Answer:   2 mg     Order Specific Question:   CIWA-AR 16 -24 =     Answer:   Recheck CIWA-AR in 1 hour; if > 15 notify MD     Order Specific Question:   CIWA-AR > 24 =     Answer:   4 mg      Order Specific Question:   CIWA-AR > 24 =     Answer:   Call Rapid Response   OR Linked Order Group    thiamine tablet 100 mg    thiamine (B-1) injection 100 mg   sodium chloride 0.9 % bolus 1,000 mL   ondansetron (ZOFRAN) injection 4 mg   chlorproMAZINE (THORAZINE) tablet 25 mg   LORazepam (ATIVAN) injection 1 mg   DISCONTD: chlordiazePOXIDE (LIBRIUM) capsule 25 mg   chlordiazePOXIDE (LIBRIUM) capsule 50 mg   chlorproMAZINE (THORAZINE) tablet 25 mg   chlordiazePOXIDE (LIBRIUM) 25 MG capsule    Sig: 50mg  PO TID x 1D, then 25-50mg  PO BID X 1D, then 25-50mg  PO QD X 1D    Dispense:  10 capsule    Refill:  0   chlorproMAZINE (THORAZINE) 25 MG tablet    Sig: Take 1 tablet (25 mg total) by mouth 3 (three) times daily as needed for hiccoughs.    Dispense:  15 tablet    Refill:  0   ondansetron (ZOFRAN-ODT) 4 MG disintegrating tablet    Sig: Take 1 tablet (4 mg total) by mouth every 8 (eight) hours as needed for nausea or vomiting.    Dispense:  20 tablet    Refill:  0    I have reviewed the patients home medicines and have made adjustments as needed  Problem List / ED Course: Problem List Items Addressed This Visit   None Visit Diagnoses     Alcohol dependence with uncomplicated intoxication (HCC)    -  Primary                  Final Clinical Impression(s) / ED Diagnoses Final  diagnoses:  Alcohol dependence with uncomplicated intoxication (HCC)    Rx / DC Orders ED Discharge Orders          Ordered    chlordiazePOXIDE (LIBRIUM) 25 MG capsule        01/22/22 0518    chlorproMAZINE (THORAZINE) 25 MG tablet  3 times daily PRN        01/22/22 0518    ondansetron (ZOFRAN-ODT) 4 MG disintegrating tablet  Every 8 hours PRN        01/22/22 0518              Shon BatonHorton, Arwin Bisceglia F, MD 01/22/22 [phone]

## 2022-01-21 NOTE — ED Triage Notes (Signed)
Patient reports to the ER for an episode of binge drinking for the last week. Reports having wine, whiskey, and vodka today. Patient was recently hospitalized for detox as well but reports he was unable to stick to the plan. Patient has had some emesis with blood. Patient reportedly hit his head on Friday, and has a gash on his forehead and was not evaluated for it.

## 2022-01-21 NOTE — ED Notes (Signed)
Pt reports last drink of wine was around 2130, has been drinking wine and whiskey "all day"

## 2022-01-22 LAB — RESP PANEL BY RT-PCR (FLU A&B, COVID) ARPGX2
Influenza A by PCR: NEGATIVE
Influenza B by PCR: NEGATIVE
SARS Coronavirus 2 by RT PCR: NEGATIVE

## 2022-01-22 MED ORDER — CHLORDIAZEPOXIDE HCL 25 MG PO CAPS: 25.0000 mg | ORAL_CAPSULE | Freq: Once | ORAL | Status: DC

## 2022-01-22 MED ORDER — CHLORDIAZEPOXIDE HCL 25 MG PO CAPS
50.0000 mg | ORAL_CAPSULE | Freq: Once | ORAL | Status: AC
  Administered 2022-01-22: 50 mg via ORAL
  Filled 2022-01-22: qty 2

## 2022-01-22 MED ORDER — CHLORPROMAZINE HCL 25 MG PO TABS
25.0000 mg | ORAL_TABLET | Freq: Once | ORAL | Status: AC
Start: 2022-01-22 — End: 2022-01-22
  Administered 2022-01-22: 25 mg via ORAL
  Filled 2022-01-22: qty 1

## 2022-01-22 MED ORDER — ONDANSETRON 4 MG PO TBDP: 4.0000 mg | ORAL_TABLET | Freq: Three times a day (TID) | ORAL | 0 refills | Status: DC | PRN

## 2022-01-22 MED ORDER — CHLORPROMAZINE HCL 25 MG PO TABS
25.0000 mg | ORAL_TABLET | Freq: Once | ORAL | Status: AC
  Administered 2022-01-22: 25 mg via ORAL
  Filled 2022-01-22: qty 1

## 2022-01-22 MED ORDER — CHLORDIAZEPOXIDE HCL 25 MG PO CAPS: ORAL_CAPSULE | ORAL | 0 refills | Status: DC

## 2022-01-22 MED ORDER — LORAZEPAM 2 MG/ML IJ SOLN
1.0000 mg | Freq: Once | INTRAMUSCULAR | Status: AC
  Administered 2022-01-22: 1 mg via INTRAVENOUS
  Filled 2022-01-22: qty 1

## 2022-01-22 MED ORDER — CHLORPROMAZINE HCL 25 MG PO TABS: 25.0000 mg | ORAL_TABLET | Freq: Three times a day (TID) | ORAL | 0 refills | Status: DC | PRN

## 2022-01-22 NOTE — Discharge Instructions (Signed)
You were seen today for alcohol intoxication.  You are at high risk for withdrawal symptoms.  Take Librium to manage your withdrawal symptoms at home.  Your alcohol abuse has begun to do some permanent damage to your liver.  If you do not quit drinking, you will likely suffer significant health effects and/or could die.

## 2022-01-22 NOTE — ED Notes (Signed)
Pt removed vital sign monitoring devices and oxygen. Pt educated on reasoning for leaving these devices on, verbalized understanding and agreed to keep the SpO2 sensor and BP cuff on. CIWA reassessed, MD notified.

## 2022-02-10 ENCOUNTER — Emergency Department (HOSPITAL_BASED_OUTPATIENT_CLINIC_OR_DEPARTMENT_OTHER)
Admission: EM | Admit: 2022-02-10 | Discharge: 2022-02-10 | Disposition: A | Discharge status: Home / Self Care | Payer: 59 | Source: Home / Self Care | Attending: Emergency Medicine | Admitting: Emergency Medicine

## 2022-02-10 ENCOUNTER — Other Ambulatory Visit: Payer: Self-pay

## 2022-02-10 ENCOUNTER — Encounter (HOSPITAL_BASED_OUTPATIENT_CLINIC_OR_DEPARTMENT_OTHER): Payer: Self-pay

## 2022-02-10 DIAGNOSIS — F1994 Other psychoactive substance use, unspecified with psychoactive substance-induced mood disorder: Secondary | ICD-10-CM | POA: Insufficient documentation

## 2022-02-10 DIAGNOSIS — R45851 Suicidal ideations: Secondary | ICD-10-CM | POA: Insufficient documentation

## 2022-02-10 DIAGNOSIS — F1010 Alcohol abuse, uncomplicated: Secondary | ICD-10-CM | POA: Insufficient documentation

## 2022-02-10 DIAGNOSIS — R251 Tremor, unspecified: Secondary | ICD-10-CM | POA: Insufficient documentation

## 2022-02-10 DIAGNOSIS — F32A Depression, unspecified: Secondary | ICD-10-CM | POA: Insufficient documentation

## 2022-02-10 DIAGNOSIS — Y908 Blood alcohol level of 240 mg/100 ml or more: Secondary | ICD-10-CM | POA: Insufficient documentation

## 2022-02-10 DIAGNOSIS — F332 Major depressive disorder, recurrent severe without psychotic features: Secondary | ICD-10-CM | POA: Diagnosis not present

## 2022-02-10 DIAGNOSIS — Z20822 Contact with and (suspected) exposure to covid-19: Secondary | ICD-10-CM | POA: Insufficient documentation

## 2022-02-10 LAB — COMPREHENSIVE METABOLIC PANEL
ALT: 44 U/L (ref 0–44)
AST: 68 U/L — ABNORMAL HIGH (ref 15–41)
Albumin: 4.8 g/dL (ref 3.5–5.0)
Alkaline Phosphatase: 28 U/L — ABNORMAL LOW (ref 38–126)
Anion gap: 15 (ref 5–15)
BUN: <5 mg/dL — ABNORMAL LOW (ref 6–20)
CO2: 26 mmol/L (ref 22–32)
Calcium: 8.8 mg/dL — ABNORMAL LOW (ref 8.9–10.3)
Chloride: 101 mmol/L (ref 98–111)
Creatinine, Ser: 0.95 mg/dL (ref 0.61–1.24)
GFR, Estimated: >60 mL/min (ref >60)
Glucose, Bld: 99 mg/dL (ref 70–99)
Potassium: 3.6 mmol/L (ref 3.5–5.1)
Sodium: 142 mmol/L (ref 135–145)
Total Bilirubin: 0.9 mg/dL (ref 0.3–1.2)
Total Protein: 7 g/dL (ref 6.5–8.1)

## 2022-02-10 LAB — CBC WITH DIFFERENTIAL/PLATELET
Abs Immature Granulocytes: 0.01 10*3/uL (ref 0.00–0.07)
Basophils Absolute: 0.1 10*3/uL (ref 0.0–0.1)
Basophils Relative: 1 %
Eosinophils Absolute: 0 10*3/uL (ref 0.0–0.5)
Eosinophils Relative: 0 %
HCT: 47.1 % (ref 39.0–52.0)
Hemoglobin: 16.1 g/dL (ref 13.0–17.0)
Immature Granulocytes: 0 %
Lymphocytes Relative: 23 %
Lymphs Abs: 1.4 10*3/uL (ref 0.7–4.0)
MCH: 31.4 pg (ref 26.0–34.0)
MCHC: 34.2 g/dL (ref 30.0–36.0)
MCV: 92 fL (ref 80.0–100.0)
Monocytes Absolute: 0.4 10*3/uL (ref 0.1–1.0)
Monocytes Relative: 6 %
Neutro Abs: 4.2 10*3/uL (ref 1.7–7.7)
Neutrophils Relative %: 70 %
Platelets: 350 10*3/uL (ref 150–400)
RBC: 5.12 MIL/uL (ref 4.22–5.81)
RDW: 13 % (ref 11.5–15.5)
WBC: 6.1 10*3/uL (ref 4.0–10.5)
nRBC: 0 % (ref 0.0–0.2)

## 2022-02-10 LAB — RESP PANEL BY RT-PCR (FLU A&B, COVID) ARPGX2
Influenza A by PCR: NEGATIVE
Influenza B by PCR: NEGATIVE
SARS Coronavirus 2 by RT PCR: NEGATIVE

## 2022-02-10 LAB — ETHANOL: Alcohol, Ethyl (B): 283 mg/dL — ABNORMAL HIGH (ref <10)

## 2022-02-10 LAB — ACETAMINOPHEN LEVEL: Acetaminophen (Tylenol), Serum: <10 ug/mL — ABNORMAL LOW (ref 10–30)

## 2022-02-10 MED ORDER — CHLORDIAZEPOXIDE HCL 25 MG PO CAPS: ORAL_CAPSULE | ORAL | 0 refills | Status: DC

## 2022-02-10 MED ORDER — LORAZEPAM 1 MG PO TABS: 0.0000 mg | ORAL_TABLET | Freq: Four times a day (QID) | ORAL | Status: DC

## 2022-02-10 MED ORDER — ONDANSETRON HCL 4 MG PO TABS: 4.0000 mg | ORAL_TABLET | Freq: Three times a day (TID) | ORAL | Status: DC | PRN

## 2022-02-10 MED ORDER — LORAZEPAM 2 MG/ML IJ SOLN
1.0000 mg | Freq: Once | INTRAMUSCULAR | Status: DC
  Filled 2022-02-10: qty 1

## 2022-02-10 MED ORDER — THIAMINE HCL 100 MG PO TABS
100.0000 mg | ORAL_TABLET | Freq: Every day | ORAL | Status: DC
  Filled 2022-02-10: qty 1

## 2022-02-10 MED ORDER — LORAZEPAM 2 MG/ML IJ SOLN
1.0000 mg | Freq: Once | INTRAMUSCULAR | Status: AC
  Administered 2022-02-10: 1 mg via INTRAVENOUS

## 2022-02-10 MED ORDER — LORAZEPAM 1 MG PO TABS: 0.0000 mg | ORAL_TABLET | Freq: Two times a day (BID) | ORAL | Status: DC

## 2022-02-10 MED ORDER — LORAZEPAM 2 MG/ML IJ SOLN
0.0000 mg | Freq: Four times a day (QID) | INTRAMUSCULAR | Status: DC
  Filled 2022-02-10: qty 1

## 2022-02-10 MED ORDER — THIAMINE HCL 100 MG/ML IJ SOLN
100.0000 mg | Freq: Every day | INTRAMUSCULAR | Status: DC
  Administered 2022-02-10: 100 mg via INTRAVENOUS
  Filled 2022-02-10: qty 2

## 2022-02-10 MED ORDER — LORAZEPAM 2 MG/ML IJ SOLN: 0.0000 mg | Freq: Two times a day (BID) | INTRAMUSCULAR | Status: DC

## 2022-02-10 NOTE — ED Provider Notes (Signed)
[facility] EMERGENCY DEPT Provider Note   CSN: [id number] Arrival date & time: 02/10/22  1019     History  Chief Complaint  Patient presents with   Suicidal    Keith Miller is a 51 y.o. male.  HPI    51 year old male with a history of alcohol abuse, who presents with concern for alcohol abuse and suicidal thoughts.   He drinks a couple of pints of vodka a day, and has been having problems with alcohol abuse and also having problems stopping.  He presents today with a friend.  He had been binge drinking recently, drinking 3 bottles of wine last night.  Reports his last drink was at 11 PM last night.  He now is having thoughts of self-harm and suicidal thoughts but then reports this is more of a feeling of not being worthy of the love his family and friends have, not feeling like a worthy person and feeling worthless.  Does not have a SI or self harm plan and has not made an attempt.  He has not had thoughts like this before ever felt this low.  Today, he feels anxious and nauseous, and feels like he is withdrawing. Denies history of suicide attempts, smoking or other drug use.  Reports he feels somewhat shaky.  Denies headache, vomiting, hallucinations, abdominal pain, cough, shortness of breath, chest pain, fever, congestion, sore throat, paresthesias.    Past Medical History:  Diagnosis Date   ETOH abuse     Past Surgical History:  Procedure Laterality Date   ANKLE SURGERY     KNEE SURGERY       Home Medications Prior to Admission medications   Medication Sig Start Date End Date Taking? Authorizing Provider  chlordiazePOXIDE (LIBRIUM) 25 MG capsule 50mg  PO TID x 1D, then 25-50mg  PO BID X 1D, then 25-50mg  PO QD X 1D 02/10/22  Yes Dearra Myhand, MD  chlorproMAZINE (THORAZINE) 25 MG tablet Take 1 tablet (25 mg total) by mouth 3 (three) times daily as needed for hiccoughs. 01/22/22   Horton, 01/24/22, MD  escitalopram (LEXAPRO) 20 MG tablet Take 20 mg by  mouth daily. 12/11/21   [provider]  ondansetron (ZOFRAN-ODT) 4 MG disintegrating tablet Take 1 tablet (4 mg total) by mouth every 8 (eight) hours as needed for nausea or vomiting. 01/22/22   Horton, 01/24/22, MD  SSS 10-5 10-5 % CREA Apply 1 application topically daily as needed (irritation). 12/13/21   [provider]  traZODone (DESYREL) 50 MG tablet Take 50 mg by mouth at bedtime as needed for sleep. 12/11/21   [provider]      Allergies    Patient has no known allergies.    Review of Systems   Review of Systems  Physical Exam Updated Vital Signs BP 124/90    Pulse 88    Temp 97.9 F (36.6 C)    Resp (!) 22    Ht 6' (1.829 m)    Wt 90.7 kg    SpO2 96%    BMI 27.12 kg/m  Physical Exam Vitals and nursing note reviewed.  Constitutional:      General: He is not in acute distress.    Appearance: He is well-developed. He is not diaphoretic.  HENT:     Head: Normocephalic and atraumatic.  Eyes:     Conjunctiva/sclera: Conjunctivae normal.  Cardiovascular:     Rate and Rhythm: Normal rate and regular rhythm.     Heart sounds: Normal heart sounds.  No murmur heard.   No friction rub. No gallop.  Pulmonary:     Effort: Pulmonary effort is normal. No respiratory distress.     Breath sounds: Normal breath sounds. No wheezing or rales.  Abdominal:     General: There is no distension.     Palpations: Abdomen is soft.     Tenderness: There is no abdominal tenderness. There is no guarding.  Musculoskeletal:     Cervical back: Normal range of motion.  Skin:    General: Skin is warm and dry.  Neurological:     Mental Status: He is alert and oriented to person, place, and time.     Comments: Slight tremor with arms extended    ED Results / Procedures / Treatments   Labs (all labs ordered are listed, but only abnormal results are displayed) Labs Reviewed  COMPREHENSIVE METABOLIC PANEL - Abnormal; Notable for the following components:      Result  Value   BUN <5 (*)    Calcium 8.8 (*)    AST 68 (*)    Alkaline Phosphatase 28 (*)    All other components within normal limits  ETHANOL - Abnormal; Notable for the following components:   Alcohol, Ethyl (B) 283 (*)    All other components within normal limits  ACETAMINOPHEN LEVEL - Abnormal; Notable for the following components:   Acetaminophen (Tylenol), Serum <10 (*)    All other components within normal limits  RESP PANEL BY RT-PCR (FLU A&B, COVID) ARPGX2  CBC WITH DIFFERENTIAL/PLATELET  RAPID URINE DRUG SCREEN, HOSP PERFORMED    EKG EKG Interpretation  Date/Time:  Saturday February 10 2022 11:49:51 EST Ventricular Rate:  82 PR Interval:  120 QRS Duration: 84 QT Interval:  370 QTC Calculation: 432 R Axis:   79 Text Interpretation: Normal sinus rhythm Cannot rule out Inferior infarct , age undetermined Abnormal ECG When compared with ECG of 21-Jan-2022 23:02,  inferior TW changes new Confirmed by Alvira Monday ([id number]) on 02/10/2022 11:02:50 PM  Radiology No results found.  Procedures Procedures    Medications Ordered in ED Medications  LORazepam (ATIVAN) injection 0-4 mg (has no administration in time range)    Or  LORazepam (ATIVAN) tablet 0-4 mg (has no administration in time range)  thiamine tablet 100 mg ( Oral See Alternative 02/10/22 1224)    Or  thiamine (B-1) injection 100 mg (100 mg Intravenous Given 02/10/22 1224)  ondansetron (ZOFRAN) tablet 4 mg (has no administration in time range)  LORazepam (ATIVAN) injection 1 mg (has no administration in time range)  LORazepam (ATIVAN) injection 1 mg (1 mg Intravenous Given 02/10/22 1556)    ED Course/ Medical Decision Making/ A&P                           Medical Decision Making Amount and/or Complexity of Data Reviewed Labs: ordered.  Risk OTC drugs. Prescription drug management.    51 year old male with a history of alcohol abuse, who presents with concern for alcohol abuse and depression, feeling of  worthlessness.   Denies medical concerns other than symptoms of withdrawal.  Is not exhibiting signs of severe wihtdrawal at this time to necessitate inpatient admission.    Ordered labs for medical clearance including etoh level.  ETOH level   ETOH level is again elevated.  Labs and ECG personally reviewed and interpreted by me otherwise without clinically significant abnormalities. He has some tremor that appears to come and go, not currently  having significant withdrawal.     Do feel he is appropriate for inpatient treatment for depression/etoh abuse.   TTS evaluated and recommend [facility].  He has been to [facility] in the past and is not certain about whether he is willing to go back. States he would like help with withdrawal and to go to [facility] on Monday. [facility]  It is unclear how severe his withdrawal will become as levels decrease to zero.  Given rx for librium, recommend returning to ED if he feels his symptoms are worsening.  Recommend going to [facility], and if this is not his wish that he follow up with other resources provided.  Do not see indication to IVC him at this time.         Final Clinical Impression(s) / ED Diagnoses Final diagnoses:  Alcohol abuse  Suicidal thoughts    Rx / DC Orders ED Discharge Orders          Ordered    chlordiazePOXIDE (LIBRIUM) 25 MG capsule        02/10/22 1552              Alvira Monday, MD 02/10/22 2311

## 2022-02-10 NOTE — ED Notes (Addendum)
Pt stable on  his feet, stady aand walked without any balance issues. Spoke to Pt's friend who stated that he heard pt admit that he was more talking and that the did not have a plan to do anything. Pt went home with friend Barbara Cower who stated that he also agreed with the Md's He doesn't need [place] to get into a residential treatment. He can call [facility]. Np Vernard Gambles (TTS) messaged that Pt doesn't need [facility] to get into a residential treatment. He can call [facility], [place]. [facility], [facility] [place].

## 2022-02-10 NOTE — ED Notes (Signed)
ED Provider at bedside. 

## 2022-02-10 NOTE — ED Notes (Signed)
I attempt to d/c pt. At this time. A registrationist at [facility] still has pt. Chart open. I phone their listed number, and get no answer.

## 2022-02-10 NOTE — BH Assessment (Signed)
Comprehensive Clinical Assessment (CCA) Note  02/10/2022 NYAIRE AMJAD [id number]  Chief Complaint:  Chief Complaint  Patient presents with   Suicidal   Visit Diagnosis:  Substance induced mood disorder  Suicidal ideation  Disposition: Per Vernard Gambles NP pt meets criteria for [facility].  Informed RN & EDP.  Flowsheet Row ED from 02/10/2022 in [facility] ED from 01/21/2022 in [facility] ED to Hosp-Admission (Discharged) from 01/02/2022 in [facility] 6 NORTH  SURGICAL  C-SSRS RISK CATEGORY Error: Q7 should not be populated when Q6 is No No Risk High Risk      The patient demonstrates the following risk factors for suicide: Chronic risk factors for suicide include: psychiatric disorder of substance induced mood disorder and substance use disorder. Acute risk factors for suicide include: family or marital conflict, unemployment, social withdrawal/isolation, and loss (financial, interpersonal, professional). Protective factors for this patient include: responsibility to others (children, family). Considering these factors, the overall suicide risk at this point appears to be high. Patient is not appropriate for outpatient follow up.   Hazen is a 51 yo male reporting to [facility] for evaluation of suicidal thoughts. Pt is accompanied by his friend, Barbara Cower, and gives verbal consent for Barbara Cower to remain in hospital room throughout assessment.  Pt does not have a psychiatrist or counselor. Pt currently taking Lexapro prescribed by PCP. Pt reports that he was triggered recently by his girlfriend of 16 years recently moving out and pt recently losing his job. Pt reports that his etoh use is "out of control". Pt reporting that he is having some suicidal thoughts with no plans and no intent to follow through. Pt states that he really wants help because he feels his alcohol use is the underlying factor to other traumas that  are happening in his life right now. Pt denies HI or AVH. Pt denies abusing any other substances.  Pt reports that he completed a residential inpatient substance use treatment in 2017 and was sober for 3.5 years. Pt reports that he drinks 3 bottles of wine or 2 pints of vodka per day. Pt has multiple ED visits related to alcohol use.   CCA Screening, Triage and Referral (STR)  Patient Reported Information How did you hear about [place]? Self  What Is the Reason for Your Visit/Call Today? Knoxton is a 51 yo male reporting to [facility] for evaluation of suicidal thoughts. Pt is accompanied by his friend, Barbara Cower, and gives verbal consent for Barbara Cower to remain in hospital room throughout assessment.  Pt does not have a psychiatrist or counselor. Pt currently taking Lexapro prescribed by PCP. Pt reports that he was triggered recently by his girlfriend of 16 years recently moving out and pt recently losing his job. Pt reports that his etoh use is "out of control". Pt reporting that he is having some suicidal thoughts with no plans and no intent to follow through. Pt states that he really wants help because he feels his alcohol use is the underlying factor to other traumas that are happening in his life right now. Pt denies HI or AVH. Pt denies abusing any other substances.  Pt reports that he completed a residential inpatient substance use treatment in 2017 and was sober for 3.5 years. Pt reports that he drinks 3 bottles of wine or 2 pints of vodka per day. Pt has multiple ED visits related to alcohol use.  How Long Has This Been Causing You Problems? <Week  What Do  You Feel Would Help You the Most Today? Alcohol or Drug Use Treatment; Treatment for Depression or other mood problem   Have You Recently Had Any Thoughts About Hurting Yourself? Yes  Are You Planning to Commit Suicide/Harm Yourself At This time? No   Have you Recently Had Thoughts About Hurting Someone Karolee Ohs? No  Are You Planning to Harm  Someone at This Time? No  Explanation: No data recorded  Have You Used Any Alcohol or Drugs in the Past 24 Hours? Yes  How Long Ago Did You Use Drugs or Alcohol? No data recorded What Did You Use and How Much? alcohol--pt admits to drinking 3 bottles of wine to Triage staff at ED   Do You Currently Have a Therapist/Psychiatrist? No  Name of Therapist/Psychiatrist: No data recorded  Have You Been Recently Discharged From Any Office Practice or Programs? No  Explanation of Discharge From Practice/Program: No data recorded    CCA Screening Triage Referral Assessment Type of Contact: Tele-Assessment  Telemedicine Service Delivery: Telemedicine service delivery: This service was provided via telemedicine using a 2-way, interactive audio and video technology  Is this Initial or Reassessment? Initial Assessment  Date Telepsych consult ordered in [facility]:  02/10/22  Time Telepsych consult ordered in [facility]:  1132  Location of Assessment: Other (comment) ([facility])  Provider Location: [facility] Assessment Services   Collateral Involvement: Pt gave verbal consent for friend, Barbara Cower, to be present in room throughout assessment   Does Patient Have a Automotive engineer Guardian? No data recorded Name and Contact of Legal Guardian: No data recorded If Minor and Not Living with Parent(s), Who has Custody? No data recorded Is CPS involved or ever been involved? Never  Is APS involved or ever been involved? Never   Patient Determined To Be At Risk for Harm To Self or Others Based on Review of Patient Reported Information or Presenting Complaint? Yes, for Self-Harm  Method: No data recorded Availability of Means: No data recorded Intent: No data recorded Notification Required: No data recorded Additional Information for Danger to Others Potential: No data recorded Additional Comments for Danger to Others Potential: No data recorded Are There Guns or Other Weapons in Your Home?  No data recorded Types of Guns/Weapons: No data recorded Are These Weapons Safely Secured?                            No data recorded Who Could Verify You Are Able To Have These Secured: No data recorded Do You Have any Outstanding Charges, Pending Court Dates, Parole/Probation? No data recorded Contacted To Inform of Risk of Harm To Self or Others: No data recorded   Does Patient Present under Involuntary Commitment? No  IVC Papers Initial File Date: No data recorded  [place] of Residence: [place]   Patient Currently Receiving the Following Services: Medication Management (PCP prescriber)   Determination of Need: Emergent (2 hours)   Options For Referral: Facility-Based Crisis     CCA Biopsychosocial Patient Reported Schizophrenia/Schizoaffective Diagnosis in Past: No   Strengths: pt actively seeking help for alcohol use   Mental Health Symptoms Depression:   Change in energy/activity; Fatigue; Hopelessness; Worthlessness; Increase/decrease in appetite; Sleep (too much or little) (decreased appetite--pt states he will often go for days without food.  hx of insomnia)   Duration of Depressive symptoms:  Duration of Depressive Symptoms: Greater than two weeks   Mania:   Racing thoughts   Anxiety:  Worrying; Sleep; Irritability; Fatigue   Psychosis:   None   Duration of Psychotic symptoms:    Trauma:  No data recorded  Obsessions:   None   Compulsions:   None   Inattention:   None   Hyperactivity/Impulsivity:   None   Oppositional/Defiant Behaviors:   None   Emotional Irregularity:   None   Other Mood/Personality Symptoms:  No data recorded   Mental Status Exam Appearance and self-care  Stature:   Average   Weight:   Thin   Clothing:   Neat/clean   Grooming:   Normal   Cosmetic use:   None   Posture/gait:   Normal   Motor activity:   Slowed   Sensorium  Attention:   Distractible   Concentration:   Preoccupied    Orientation:   X5   Recall/memory:   Normal   Affect and Mood  Affect:   Depressed   Mood:   Depressed   Relating  Eye contact:   Normal   Facial expression:   Depressed   Attitude toward examiner:   Cooperative   Thought and Language  Speech flow:  Clear and Coherent   Thought content:   Appropriate to Mood and Circumstances   Preoccupation:   Suicide; Guilt   Hallucinations:   None   Organization:  No data recorded  Transport planner of Knowledge:   Good   Intelligence:   Average   Abstraction:   Normal   Judgement:   Dangerous   Reality Testing:   Variable   Insight:   Gaps   Decision Making:   Impulsive   Social Functioning  Social Maturity:   Impulsive; Irresponsible   Social Judgement:   Heedless   Stress  Stressors:   Family conflict; Grief/losses; Housing; Scientist, research (physical sciences); Work; Relationship; Financial   Coping Ability:   Overwhelmed; Exhausted   Skill Deficits:   Self-control   Supports:   Friends/Service system     Religion: Religion/Spirituality Are You A Religious Person?: No  Leisure/Recreation: Leisure / Recreation Do You Have Hobbies?: Yes Leisure and Hobbies: spending time with family and friends  Exercise/Diet: Exercise/Diet Do You Exercise?: Yes Have You Gained or Lost A Significant Amount of Weight in the Past Six Months?: No Do You Follow a Special Diet?: No Do You Have Any Trouble Sleeping?: Yes Explanation of Sleeping Difficulties: insomnia most nights--hard to fall asleep and stay asleep   CCA Employment/Education Employment/Work Situation: Employment / Work Situation Employment Situation: Unemployed Patient's Job has Been Impacted by Current Illness: Yes Describe how Patient's Job has Been Impacted: pt recently lost his job Has Patient ever Been in Passenger transport manager?: No  Education: Education Is Patient Currently Attending School?: No Did Physicist, medical?: Yes Did You Have An  Individualized Education Program (IIEP): No Did You Have Any Difficulty At [employer]?: No Patient's Education Has Been Impacted by Current Illness: No   CCA Family/Childhood History Family and Relationship History: Family history Marital status: Long term relationship Long term relationship, how long?: 16 years What types of issues is patient dealing with in the relationship?: pts long term girlfriend is in the process of moving out Additional relationship information: pt is very close with girlfriends son--"my son" Does patient have children?: Yes How many children?: 1 How is patient's relationship with their children?: son  Childhood History:  Childhood History By whom was/is the patient raised?: Both parents Did patient suffer any verbal/emotional/physical/sexual abuse as a child?: No Did patient suffer from severe  childhood neglect?: No Has patient ever been sexually abused/assaulted/raped as an adolescent or adult?: No Was the patient ever a victim of a crime or a disaster?: No Witnessed domestic violence?: No Has patient been affected by domestic violence as an adult?: No  Child/Adolescent Assessment: N/a    CCA Substance Use Alcohol/Drug Use: Alcohol / Drug Use Pain Medications: SEE MAR Prescriptions: SEE MAR Over the Counter: SEE MAR History of alcohol / drug use?: Yes Substance #1 Name of Substance 1: ETOH 1 - Amount (size/oz): variable amounts--pt reports 3 bottles of wine or 2 pints of vodka 1 - Frequency: daily 1 - Last Use / Amount: last night 1 - Method of Aquiring: legal 1- Route of Use: oral/drink    ASAM's:  Six Dimensions of Multidimensional Assessment  Dimension 1:  Acute Intoxication and/or Withdrawal Potential:   Dimension 1:  Description of individual's past and current experiences of substance use and withdrawal: recent intoxication  Dimension 2:  Biomedical Conditions and Complications:   Dimension 2:  Description of patient's biomedical  conditions and  complications: health related concerns  Dimension 3:  Emotional, Behavioral, or Cognitive Conditions and Complications:  Dimension 3:  Description of emotional, behavioral, or cognitive conditions and complications: suicidal thoughts  Dimension 4:  Readiness to Change:  Dimension 4:  Description of Readiness to Change criteria: pt willing to make changes  Dimension 5:  Relapse, Continued use, or Continued Problem Potential:  Dimension 5:  Relapse, continued use, or continued problem potential critiera description: pt has history of relapse  Dimension 6:  Recovery/Living Environment:  Dimension 6:  Recovery/Iiving environment criteria description: big change to environment--partner is leaving  ASAM Severity Score: ASAM's Severity Rating Score: 9  ASAM Recommended Level of Treatment:     Substance use Disorder (SUD) Substance Use Disorder (SUD)  Checklist Symptoms of Substance Use: Continued use despite having a persistent/recurrent physical/psychological problem caused/exacerbated by use, Continued use despite persistent or recurrent social, interpersonal problems, caused or exacerbated by use, Persistent desire or unsuccessful efforts to cut down or control use, Recurrent use that results in a failure to fulfill major role obligations (work, school, home)  Recommendations for Services/Supports/Treatments: Recommendations for Services/Supports/Treatments Recommendations For Services/Supports/Treatments: Facility Based Crisis  Discharge Disposition:  FBC  DSM5 Diagnoses: Patient Active Problem List   Diagnosis Date Noted   Substance induced mood disorder ([place])    Suicidal ideation    Alcohol withdrawal syndrome with complication, with unspecified complication ([place]) 0000000   Alcohol withdrawal (Manuel Garcia) 01/02/2022     Referrals to Alternative Service(s): Referred to Alternative Service(s):   Place:   Date:   Time:    Referred to Alternative Service(s):   Place:   Date:    Time:    Referred to Alternative Service(s):   Place:   Date:   Time:    Referred to Alternative Service(s):   Place:   Date:   Time:     Rachel Bo Fenna Semel, LCSW

## 2022-02-10 NOTE — ED Notes (Signed)
Pt stated that he agreed to the MD's plan and was on the same page. Pt has a calm demeanor and is not agitated. Pt laer tand oreitned and follows commands. Pt is polite ans answers questions yes sir and no sir. Pt stated he was ready to go home

## 2022-02-10 NOTE — BH Assessment (Signed)
TTS ready to assess pt--waiting on medical clearance at this time.

## 2022-02-10 NOTE — ED Notes (Signed)
Checked on Pt and Pt resting. Asked if he was ok and he responded that he was fine

## 2022-02-10 NOTE — ED Triage Notes (Signed)
Pt presents POV with a friend.  Pt states he has been binge drinking recently, drank 3 bottles of wine last night.  No alcohol today.  Pt states he feels suicidal but does not have a plan and has not made an attempt.

## 2022-02-12 ENCOUNTER — Other Ambulatory Visit: Payer: Self-pay

## 2022-02-12 ENCOUNTER — Encounter (HOSPITAL_COMMUNITY): Payer: Self-pay

## 2022-02-12 ENCOUNTER — Emergency Department (HOSPITAL_COMMUNITY)
Admission: EM | Admit: 2022-02-12 | Discharge: 2022-02-13 | Disposition: A | Discharge status: Other Acute Inpatient Hospital | Payer: 59 | Source: Home / Self Care | Attending: Emergency Medicine | Admitting: Emergency Medicine

## 2022-02-12 DIAGNOSIS — Y907 Blood alcohol level of 200-239 mg/100 ml: Secondary | ICD-10-CM | POA: Insufficient documentation

## 2022-02-12 DIAGNOSIS — F332 Major depressive disorder, recurrent severe without psychotic features: Secondary | ICD-10-CM | POA: Insufficient documentation

## 2022-02-12 DIAGNOSIS — Z20822 Contact with and (suspected) exposure to covid-19: Secondary | ICD-10-CM | POA: Insufficient documentation

## 2022-02-12 DIAGNOSIS — E876 Hypokalemia: Secondary | ICD-10-CM | POA: Insufficient documentation

## 2022-02-12 DIAGNOSIS — R45851 Suicidal ideations: Secondary | ICD-10-CM

## 2022-02-12 DIAGNOSIS — F1994 Other psychoactive substance use, unspecified with psychoactive substance-induced mood disorder: Secondary | ICD-10-CM | POA: Insufficient documentation

## 2022-02-12 LAB — COMPREHENSIVE METABOLIC PANEL
ALT: 36 U/L (ref 0–44)
AST: 37 U/L (ref 15–41)
Albumin: 4.5 g/dL (ref 3.5–5.0)
Alkaline Phosphatase: 36 U/L — ABNORMAL LOW (ref 38–126)
Anion gap: 13 (ref 5–15)
BUN: 9 mg/dL (ref 6–20)
CO2: 24 mmol/L (ref 22–32)
Calcium: 9.3 mg/dL (ref 8.9–10.3)
Chloride: 106 mmol/L (ref 98–111)
Creatinine, Ser: 0.92 mg/dL (ref 0.61–1.24)
GFR, Estimated: >60 mL/min (ref >60)
Glucose, Bld: 138 mg/dL — ABNORMAL HIGH (ref 70–99)
Potassium: 3.1 mmol/L — ABNORMAL LOW (ref 3.5–5.1)
Sodium: 143 mmol/L (ref 135–145)
Total Bilirubin: 0.4 mg/dL (ref 0.3–1.2)
Total Protein: 6.8 g/dL (ref 6.5–8.1)

## 2022-02-12 LAB — ETHANOL: Alcohol, Ethyl (B): 235 mg/dL — ABNORMAL HIGH (ref <10)

## 2022-02-12 LAB — RESP PANEL BY RT-PCR (FLU A&B, COVID) ARPGX2
Influenza A by PCR: NEGATIVE
Influenza B by PCR: NEGATIVE
SARS Coronavirus 2 by RT PCR: NEGATIVE

## 2022-02-12 LAB — CBC WITH DIFFERENTIAL/PLATELET
Abs Immature Granulocytes: 0.01 10*3/uL (ref 0.00–0.07)
Basophils Absolute: 0.1 10*3/uL (ref 0.0–0.1)
Basophils Relative: 1 %
Eosinophils Absolute: 0.2 10*3/uL (ref 0.0–0.5)
Eosinophils Relative: 3 %
HCT: 46.1 % (ref 39.0–52.0)
Hemoglobin: 15.7 g/dL (ref 13.0–17.0)
Immature Granulocytes: 0 %
Lymphocytes Relative: 30 %
Lymphs Abs: 1.7 10*3/uL (ref 0.7–4.0)
MCH: 32.3 pg (ref 26.0–34.0)
MCHC: 34.1 g/dL (ref 30.0–36.0)
MCV: 94.9 fL (ref 80.0–100.0)
Monocytes Absolute: 0.4 10*3/uL (ref 0.1–1.0)
Monocytes Relative: 6 %
Neutro Abs: 3.5 10*3/uL (ref 1.7–7.7)
Neutrophils Relative %: 60 %
Platelets: 275 10*3/uL (ref 150–400)
RBC: 4.86 MIL/uL (ref 4.22–5.81)
RDW: 12.8 % (ref 11.5–15.5)
WBC: 5.8 10*3/uL (ref 4.0–10.5)
nRBC: 0 % (ref 0.0–0.2)

## 2022-02-12 LAB — SALICYLATE LEVEL: Salicylate Lvl: <7 mg/dL — ABNORMAL LOW (ref 7.0–30.0)

## 2022-02-12 LAB — ACETAMINOPHEN LEVEL: Acetaminophen (Tylenol), Serum: <10 ug/mL — ABNORMAL LOW (ref 10–30)

## 2022-02-12 MED ORDER — LORAZEPAM 2 MG/ML IJ SOLN: 0.0000 mg | Freq: Four times a day (QID) | INTRAMUSCULAR | Status: DC

## 2022-02-12 MED ORDER — LORAZEPAM 2 MG/ML IJ SOLN: 0.0000 mg | Freq: Two times a day (BID) | INTRAMUSCULAR | Status: DC

## 2022-02-12 MED ORDER — POTASSIUM CHLORIDE CRYS ER 20 MEQ PO TBCR
40.0000 meq | EXTENDED_RELEASE_TABLET | Freq: Once | ORAL | Status: AC
  Administered 2022-02-12: 40 meq via ORAL
  Filled 2022-02-12: qty 2

## 2022-02-12 MED ORDER — ONDANSETRON 4 MG PO TBDP: 4.0000 mg | ORAL_TABLET | Freq: Three times a day (TID) | ORAL | Status: DC | PRN

## 2022-02-12 MED ORDER — THIAMINE HCL 100 MG PO TABS
100.0000 mg | ORAL_TABLET | Freq: Every day | ORAL | Status: DC
  Administered 2022-02-13: 100 mg via ORAL
  Filled 2022-02-12: qty 1

## 2022-02-12 MED ORDER — LORAZEPAM 1 MG PO TABS
0.0000 mg | ORAL_TABLET | Freq: Four times a day (QID) | ORAL | Status: DC
  Administered 2022-02-12: 2 mg via ORAL
  Administered 2022-02-13: 1 mg via ORAL
  Filled 2022-02-12: qty 1
  Filled 2022-02-12: qty 2

## 2022-02-12 MED ORDER — ESCITALOPRAM OXALATE 10 MG PO TABS
20.0000 mg | ORAL_TABLET | Freq: Every day | ORAL | Status: DC
  Administered 2022-02-13: 20 mg via ORAL
  Filled 2022-02-12: qty 2

## 2022-02-12 MED ORDER — THIAMINE HCL 100 MG/ML IJ SOLN: 100.0000 mg | Freq: Every day | INTRAMUSCULAR | Status: DC

## 2022-02-12 MED ORDER — CHLORPROMAZINE HCL 25 MG PO TABS: 25.0000 mg | ORAL_TABLET | Freq: Three times a day (TID) | ORAL | Status: DC | PRN

## 2022-02-12 MED ORDER — LORAZEPAM 1 MG PO TABS: 0.0000 mg | ORAL_TABLET | Freq: Two times a day (BID) | ORAL | Status: DC

## 2022-02-12 NOTE — ED Provider Notes (Addendum)
[facility]-EMERGENCY DEPT Provider Note   CSN: [id number] Arrival date & time: 02/12/22  2120     History  Chief Complaint  Patient presents with   IVC   Suicidal    Keith Miller is a 51 y.o. male.  51 year old male who presents under IVC due to suicidal ideations.  Patient has a history of alcohol abuse.  Was seen recently by the behavioral health team and was recommended for facility based crisis stabilization.  No records to show what happened with without evaluation.  Patient's sister placed him under IVC due to patient's continued use of alcohol as well as expression and kill himself.      Home Medications Prior to Admission medications   Medication Sig Start Date End Date Taking? Authorizing Provider  chlordiazePOXIDE (LIBRIUM) 25 MG capsule 50mg  PO TID x 1D, then 25-50mg  PO BID X 1D, then 25-50mg  PO QD X 1D 02/10/22   04/10/22, MD  chlorproMAZINE (THORAZINE) 25 MG tablet Take 1 tablet (25 mg total) by mouth 3 (three) times daily as needed for hiccoughs. 01/22/22   Horton, 01/24/22, MD  escitalopram (LEXAPRO) 20 MG tablet Take 20 mg by mouth daily. 12/11/21   [provider]  ondansetron (ZOFRAN-ODT) 4 MG disintegrating tablet Take 1 tablet (4 mg total) by mouth every 8 (eight) hours as needed for nausea or vomiting. 01/22/22   Horton, 01/24/22, MD  SSS 10-5 10-5 % CREA Apply 1 application topically daily as needed (irritation). 12/13/21   [provider]  traZODone (DESYREL) 50 MG tablet Take 50 mg by mouth at bedtime as needed for sleep. 12/11/21   [provider]      Allergies    Patient has no known allergies.    Review of Systems   Review of Systems  All other systems reviewed and are negative.  Physical Exam Updated Vital Signs BP (!) 146/99 (BP Location: Left Arm)    Pulse (!) 101    Temp 97.9 F (36.6 C) (Oral)    Resp (!) 28    Ht 1.829 m (6')    Wt 90.7 kg    SpO2 94%    BMI 27.12 kg/m   Physical Exam Vitals and nursing note reviewed.  Constitutional:      General: He is not in acute distress.    Appearance: Normal appearance. He is well-developed. He is not toxic-appearing.  HENT:     Head: Normocephalic and atraumatic.  Eyes:     General: Lids are normal.     Conjunctiva/sclera: Conjunctivae normal.     Pupils: Pupils are equal, round, and reactive to light.  Neck:     Thyroid: No thyroid mass.     Trachea: No tracheal deviation.  Cardiovascular:     Rate and Rhythm: Normal rate and regular rhythm.     Heart sounds: Normal heart sounds. No murmur heard.   No gallop.  Pulmonary:     Effort: Pulmonary effort is normal. No respiratory distress.     Breath sounds: Normal breath sounds. No stridor. No decreased breath sounds, wheezing, rhonchi or rales.  Abdominal:     General: There is no distension.     Palpations: Abdomen is soft.     Tenderness: There is no abdominal tenderness. There is no rebound.  Musculoskeletal:        General: No tenderness. Normal range of motion.     Cervical back: Normal range of motion and neck supple.  Skin:  General: Skin is warm and dry.     Findings: No abrasion or rash.  Neurological:     General: No focal deficit present.     Mental Status: He is alert and oriented to person, place, and time. Mental status is at baseline.     GCS: GCS eye subscore is 4. GCS verbal subscore is 5. GCS motor subscore is 6.     Cranial Nerves: No cranial nerve deficit.     Sensory: No sensory deficit.     Motor: Motor function is intact.  Psychiatric:        Attention and Perception: Attention normal.        Speech: Speech normal.        Behavior: Behavior normal.        Thought Content: Thought content does not include suicidal ideation. Thought content does not include suicidal plan.    ED Results / Procedures / Treatments   Labs (all labs ordered are listed, but only abnormal results are displayed) Labs Reviewed  COMPREHENSIVE  METABOLIC PANEL - Abnormal; Notable for the following components:      Result Value   Potassium 3.1 (*)    Glucose, Bld 138 (*)    Alkaline Phosphatase 36 (*)    All other components within normal limits  ETHANOL - Abnormal; Notable for the following components:   Alcohol, Ethyl (B) 235 (*)    All other components within normal limits  SALICYLATE LEVEL - Abnormal; Notable for the following components:   Salicylate Lvl <7.0 (*)    All other components within normal limits  ACETAMINOPHEN LEVEL - Abnormal; Notable for the following components:   Acetaminophen (Tylenol), Serum <10 (*)    All other components within normal limits  RESP PANEL BY RT-PCR (FLU A&B, COVID) ARPGX2  CBC WITH DIFFERENTIAL/PLATELET  RAPID URINE DRUG SCREEN, HOSP PERFORMED    EKG None  Radiology No results found.  Procedures Procedures    Medications Ordered in ED Medications  potassium chloride SA (KLOR-CON M) CR tablet 40 mEq (has no administration in time range)    ED Course/ Medical Decision Making/ A&P                           Medical Decision Making Risk Prescription drug management.  Old records reviewed extensively.  Patient also here by law [place] and spoke with the officer. Patient with mild hypokalemia here which was treated with oral potassium.  Patient with recent evaluation by psychiatry.  I was only 2 days ago.  He returns again under IVC this time.  Concern that he is at high risk for completing suicide.  Patient's first exam completed.  He is medically cleared for behavioral health evaluation        Final Clinical Impression(s) / ED Diagnoses Final diagnoses:  None    Rx / DC Orders ED Discharge Orders     None         Lorre Nick, MD 02/12/22 2256    Lorre Nick, MD 02/12/22 2256

## 2022-02-12 NOTE — ED Triage Notes (Signed)
Pt BIB police with IVC paperwork. Pt has threatened to harm himself and has a hx of alcohol abuse. Pt asked for his friends pistol so that he could shoot himself.

## 2022-02-12 NOTE — ED Provider Triage Note (Signed)
Emergency Medicine Provider Triage Evaluation Note  Keith Miller , a 51 y.o. male  was evaluated in triage.  Pt brought in under police custody for IVC.  Per police patient has been drinking excessively and expressed thoughts of hurting himself to sister and friends.  Sister filed IVC order.  Patient wishes to go home.  Denies any thoughts right now.  Review of Systems  Positive:  Negative: See above   Physical Exam  There were no vitals taken for this visit. Gen:   Awake, no distress   Resp:  Normal effort  MSK:   Moves extremities without difficulty  Other:    Medical Decision Making  Medically screening exam initiated at 9:43 PM.  Appropriate orders placed.  Keith Miller was informed that the remainder of the evaluation will be completed by another provider, this initial triage assessment does not replace that evaluation, and the importance of remaining in the ED until their evaluation is complete.     Honor Loh [place], [place] 02/12/22 2144

## 2022-02-13 ENCOUNTER — Encounter (HOSPITAL_COMMUNITY): Payer: Self-pay | Admitting: Family

## 2022-02-13 ENCOUNTER — Inpatient Hospital Stay (HOSPITAL_COMMUNITY)
Admission: AD | Admit: 2022-02-13 | Discharge: 2022-02-17 | DRG: 885 | Disposition: A | Discharge status: Home / Self Care | Payer: 59 | Source: Intra-hospital | Attending: Psychiatry | Admitting: Psychiatry

## 2022-02-13 DIAGNOSIS — R45851 Suicidal ideations: Secondary | ICD-10-CM | POA: Diagnosis present

## 2022-02-13 DIAGNOSIS — R296 Repeated falls: Secondary | ICD-10-CM | POA: Diagnosis present

## 2022-02-13 DIAGNOSIS — F10239 Alcohol dependence with withdrawal, unspecified: Secondary | ICD-10-CM | POA: Diagnosis present

## 2022-02-13 DIAGNOSIS — Z79899 Other long term (current) drug therapy: Secondary | ICD-10-CM

## 2022-02-13 DIAGNOSIS — R110 Nausea: Secondary | ICD-10-CM | POA: Diagnosis present

## 2022-02-13 DIAGNOSIS — Z20822 Contact with and (suspected) exposure to covid-19: Secondary | ICD-10-CM | POA: Diagnosis present

## 2022-02-13 DIAGNOSIS — R251 Tremor, unspecified: Secondary | ICD-10-CM | POA: Diagnosis present

## 2022-02-13 DIAGNOSIS — F1020 Alcohol dependence, uncomplicated: Secondary | ICD-10-CM

## 2022-02-13 DIAGNOSIS — Z560 Unemployment, unspecified: Secondary | ICD-10-CM | POA: Diagnosis not present

## 2022-02-13 DIAGNOSIS — F419 Anxiety disorder, unspecified: Secondary | ICD-10-CM | POA: Diagnosis present

## 2022-02-13 DIAGNOSIS — Y908 Blood alcohol level of 240 mg/100 ml or more: Secondary | ICD-10-CM | POA: Diagnosis present

## 2022-02-13 DIAGNOSIS — F332 Major depressive disorder, recurrent severe without psychotic features: Principal | ICD-10-CM | POA: Diagnosis present

## 2022-02-13 DIAGNOSIS — Z818 Family history of other mental and behavioral disorders: Secondary | ICD-10-CM | POA: Diagnosis not present

## 2022-02-13 DIAGNOSIS — F10939 Alcohol use, unspecified with withdrawal, unspecified: Secondary | ICD-10-CM | POA: Diagnosis present

## 2022-02-13 LAB — RAPID URINE DRUG SCREEN, HOSP PERFORMED
Amphetamines: NOT DETECTED
Amphetamines: NOT DETECTED
Barbiturates: NOT DETECTED
Barbiturates: NOT DETECTED
Benzodiazepines: POSITIVE — AB
Benzodiazepines: POSITIVE — AB
Cocaine: NOT DETECTED
Cocaine: NOT DETECTED
Opiates: NOT DETECTED
Opiates: NOT DETECTED
Tetrahydrocannabinol: NOT DETECTED
Tetrahydrocannabinol: NOT DETECTED

## 2022-02-13 MED ORDER — LIP MEDEX EX OINT
TOPICAL_OINTMENT | Freq: Once | CUTANEOUS | Status: AC
Start: 2022-02-13 — End: 2022-02-13
  Administered 2022-02-13: 1 via TOPICAL
  Filled 2022-02-13: qty 7

## 2022-02-13 MED ORDER — TRAZODONE HCL 50 MG PO TABS
50.0000 mg | ORAL_TABLET | Freq: Every evening | ORAL | Status: DC | PRN
  Administered 2022-02-13 – 2022-02-16 (×4): 50 mg via ORAL
  Filled 2022-02-13 (×5): qty 1
  Filled 2022-02-13: qty 7

## 2022-02-13 MED ORDER — LORAZEPAM 2 MG/ML IJ SOLN: 0.0000 mg | Freq: Two times a day (BID) | INTRAMUSCULAR | Status: DC

## 2022-02-13 MED ORDER — THIAMINE HCL 100 MG PO TABS: 100.0000 mg | ORAL_TABLET | Freq: Every day | ORAL | Status: DC

## 2022-02-13 MED ORDER — LORAZEPAM 1 MG PO TABS
1.0000 mg | ORAL_TABLET | Freq: Once | ORAL | Status: AC
  Administered 2022-02-13: 1 mg via ORAL

## 2022-02-13 MED ORDER — LOPERAMIDE HCL 2 MG PO CAPS: 2.0000 mg | ORAL_CAPSULE | ORAL | Status: AC | PRN

## 2022-02-13 MED ORDER — ADULT MULTIVITAMIN W/MINERALS CH
1.0000 | ORAL_TABLET | Freq: Every day | ORAL | Status: DC
  Administered 2022-02-13 – 2022-02-17 (×5): 1 via ORAL
  Filled 2022-02-13 (×8): qty 1

## 2022-02-13 MED ORDER — LORAZEPAM 1 MG PO TABS: 0.0000 mg | ORAL_TABLET | Freq: Two times a day (BID) | ORAL | Status: DC

## 2022-02-13 MED ORDER — LORAZEPAM 1 MG PO TABS
1.0000 mg | ORAL_TABLET | Freq: Two times a day (BID) | ORAL | Status: AC
  Administered 2022-02-15 – 2022-02-16 (×2): 1 mg via ORAL
  Filled 2022-02-13 (×2): qty 1

## 2022-02-13 MED ORDER — LIP MEDEX EX OINT
TOPICAL_OINTMENT | Freq: Once | CUTANEOUS | Status: DC
  Filled 2022-02-13: qty 7

## 2022-02-13 MED ORDER — LORAZEPAM 1 MG PO TABS
1.0000 mg | ORAL_TABLET | Freq: Four times a day (QID) | ORAL | Status: AC
  Administered 2022-02-13 – 2022-02-14 (×4): 1 mg via ORAL
  Filled 2022-02-13 (×4): qty 1

## 2022-02-13 MED ORDER — THIAMINE HCL 100 MG PO TABS
100.0000 mg | ORAL_TABLET | Freq: Every day | ORAL | Status: DC
  Administered 2022-02-14 – 2022-02-17 (×4): 100 mg via ORAL
  Filled 2022-02-13 (×6): qty 1

## 2022-02-13 MED ORDER — MAGNESIUM HYDROXIDE 400 MG/5ML PO SUSP: 30.0000 mL | Freq: Every day | ORAL | Status: DC | PRN

## 2022-02-13 MED ORDER — THIAMINE HCL 100 MG/ML IJ SOLN
100.0000 mg | Freq: Once | INTRAMUSCULAR | Status: AC
  Administered 2022-02-13: 100 mg via INTRAMUSCULAR
  Filled 2022-02-13: qty 2

## 2022-02-13 MED ORDER — ONDANSETRON 4 MG PO TBDP: 4.0000 mg | ORAL_TABLET | Freq: Three times a day (TID) | ORAL | Status: DC | PRN

## 2022-02-13 MED ORDER — OLANZAPINE 5 MG PO TBDP: 5.0000 mg | ORAL_TABLET | Freq: Three times a day (TID) | ORAL | Status: DC | PRN

## 2022-02-13 MED ORDER — ESCITALOPRAM OXALATE 20 MG PO TABS
20.0000 mg | ORAL_TABLET | Freq: Every day | ORAL | Status: DC
  Administered 2022-02-14 – 2022-02-17 (×4): 20 mg via ORAL
  Filled 2022-02-13 (×4): qty 1
  Filled 2022-02-13: qty 7
  Filled 2022-02-13: qty 1

## 2022-02-13 MED ORDER — THIAMINE HCL 100 MG/ML IJ SOLN: 100.0000 mg | Freq: Once | INTRAMUSCULAR | Status: DC

## 2022-02-13 MED ORDER — LORAZEPAM 1 MG PO TABS: 1.0000 mg | ORAL_TABLET | Freq: Four times a day (QID) | ORAL | Status: AC | PRN

## 2022-02-13 MED ORDER — ACETAMINOPHEN 325 MG PO TABS: 650.0000 mg | ORAL_TABLET | Freq: Four times a day (QID) | ORAL | Status: DC | PRN

## 2022-02-13 MED ORDER — LORAZEPAM 1 MG PO TABS
1.0000 mg | ORAL_TABLET | Freq: Every day | ORAL | Status: AC
  Administered 2022-02-17: 1 mg via ORAL
  Filled 2022-02-13: qty 1

## 2022-02-13 MED ORDER — CHLORPROMAZINE HCL 25 MG PO TABS: 25.0000 mg | ORAL_TABLET | Freq: Three times a day (TID) | ORAL | Status: DC | PRN

## 2022-02-13 MED ORDER — LORAZEPAM 1 MG PO TABS: 2.0000 mg | ORAL_TABLET | Freq: Once | ORAL | Status: DC

## 2022-02-13 MED ORDER — HYDROXYZINE HCL 25 MG PO TABS
25.0000 mg | ORAL_TABLET | Freq: Four times a day (QID) | ORAL | Status: AC | PRN
  Administered 2022-02-13 – 2022-02-15 (×3): 25 mg via ORAL
  Filled 2022-02-13 (×4): qty 1

## 2022-02-13 MED ORDER — LORAZEPAM 1 MG PO TABS: 0.0000 mg | ORAL_TABLET | Freq: Four times a day (QID) | ORAL | Status: DC

## 2022-02-13 MED ORDER — LORAZEPAM 2 MG/ML IJ SOLN: 0.0000 mg | Freq: Four times a day (QID) | INTRAMUSCULAR | Status: DC

## 2022-02-13 MED ORDER — ZIPRASIDONE MESYLATE 20 MG IM SOLR: 20.0000 mg | INTRAMUSCULAR | Status: DC | PRN

## 2022-02-13 MED ORDER — LORAZEPAM 1 MG PO TABS
1.0000 mg | ORAL_TABLET | Freq: Three times a day (TID) | ORAL | Status: AC
  Administered 2022-02-14 – 2022-02-15 (×2): 1 mg via ORAL
  Filled 2022-02-13 (×2): qty 1

## 2022-02-13 MED ORDER — THIAMINE HCL 100 MG/ML IJ SOLN: 100.0000 mg | Freq: Every day | INTRAMUSCULAR | Status: DC

## 2022-02-13 MED ORDER — ONDANSETRON 4 MG PO TBDP: 4.0000 mg | ORAL_TABLET | Freq: Four times a day (QID) | ORAL | Status: DC | PRN

## 2022-02-13 MED ORDER — ALUM & MAG HYDROXIDE-SIMETH 200-200-20 MG/5ML PO SUSP: 30.0000 mL | ORAL | Status: DC | PRN

## 2022-02-13 MED ORDER — LORAZEPAM 1 MG PO TABS
ORAL_TABLET | ORAL | Status: AC
  Filled 2022-02-13: qty 1

## 2022-02-13 NOTE — Progress Notes (Addendum)
Pt is a 51 y.o. male involuntarily committed at [facility] due to SI and ETOH use disorder.  Pt has lost his job and has recently found out that his girlfriend of many years and girlfriend's teenaged son (that pt raised since son was 75 years old) are moving out.  Pt has had been drinking heavily for months.  Pt denies that he was ever suicidal.  Pt has been anxious on the unit and has responded well to ativan.  Pt is calm at this time.  VSS.

## 2022-02-13 NOTE — BH Assessment (Addendum)
@  0626, requested Danielle, RN to set up the TTS machine for patient to be evaluated.

## 2022-02-13 NOTE — ED Notes (Signed)
Call placed to [employer] for transport to [facility].

## 2022-02-13 NOTE — ED Notes (Signed)
Report called in to [place], RN at this time.

## 2022-02-13 NOTE — [facility] Group Notes (Signed)
Adult Psychoeducational Group Note  Date:  02/13/2022 Time:  8:42 PM  Group Topic/Focus:  Wrap-Up Group:   The focus of this group is to help patients review their daily goal of treatment and discuss progress on daily workbooks.  Participation Level:  Minimal  Participation Quality:  Appropriate  Affect:  Appropriate  Cognitive:  Appropriate  Insight: Appropriate  Engagement in Group:  Engaged  Modes of Intervention:  Discussion  Additional Comments:  Patient attended and participated in the [place] group.  Annie Sable 02/13/2022, 8:42 PM

## 2022-02-13 NOTE — Tx Team (Signed)
Initial Treatment Plan 02/13/2022 6:49 PM [name] [id number]    PATIENT STRESSORS: Financial difficulties   Marital or family conflict   Substance abuse     PATIENT STRENGTHS: Average or above average intelligence  Communication skills  Physical Health    PATIENT IDENTIFIED PROBLEMS: Depression  Suicidal ideation  "I want to be able to sleep"                 DISCHARGE CRITERIA:  Ability to meet basic life and health needs Motivation to continue treatment in a less acute level of care  PRELIMINARY DISCHARGE PLAN: Outpatient therapy Participate in family therapy  PATIENT/FAMILY INVOLVEMENT: This treatment plan has been presented to and reviewed with the patient, Keith Miller. The patient has been given the opportunity to ask questions and make suggestions.  Garnette Scheuermann, RN 02/13/2022, 6:49 PM

## 2022-02-13 NOTE — BH Assessment (Signed)
[facility] Assessment Progress Note   Per Sheran Fava, NP, this pt requires psychiatric hospitalization.  Danika, RN, [facility] has assigned pt to [facility] Rm 300-1 to the service of Dr Nelda Marseille.  [place] will be ready to receive pt at 12:00.  Pt presents under IVC initiated by pt's sister, and upheld by EDP Lacretia Leigh, MD, and IVC documents have been faxed to [facility].  EDP Sherwood Gambler, MD and pt's nurse, Andee Poles, have been notified, and Andee Poles agrees to call report to [phone].  Pt is to be transported via [employer].   Jalene Mullet, [place] Coordinator [phone]

## 2022-02-13 NOTE — ED Notes (Signed)
Pt discharged with 2 bags of patient belongings, transported by [facility] to [facility]

## 2022-02-13 NOTE — Progress Notes (Signed)
°   02/13/22 2111  Psych Admission Type (Psych Patients Only)  Admission Status Involuntary  Psychosocial Assessment  Patient Complaints Anxiety  Eye Contact Brief;Fair  Facial Expression Anxious  Affect Anxious;Appropriate to circumstance  Speech Logical/coherent;Soft  Interaction Minimal;Guarded  Motor Activity Other (Comment) (WDL)  Appearance/Hygiene Unremarkable  Behavior Characteristics Cooperative;Appropriate to situation  Mood Anxious;Pleasant  Thought Process  Coherency WDL  Content Blaming self  Delusions None reported or observed  Perception WDL  Hallucination None reported or observed  Judgment Poor  Confusion None  Danger to Self  Current suicidal ideation? Denies  Danger to Others  Danger to Others None reported or observed

## 2022-02-13 NOTE — BH Assessment (Addendum)
Comprehensive Clinical Assessment (CCA) Note  02/13/2022 GOERGE Miller [id number]  Disposition: TTS completed. Per [facility] provider Sheran Fava, EDP) patient meets criteria for inpatient psychiatric treatment. Disposition Counselor to seek appropriate placement.   [place] ED from 02/12/2022 in [place] DEPT ED from 02/10/2022 in [place] [facility] ED from 01/21/2022 in [place] [facility]  C-SSRS RISK CATEGORY High Risk Error: Q7 should not be populated when Q6 is No No Risk      The patient demonstrates the following risk factors for suicide: Chronic risk factors for suicide include: psychiatric disorder of Major Depressive Disorder, Recurrent, Severe, without psychotic features; Substance Induced Mood Disorder; Substance Use Disorder and substance use disorder. Acute risk factors for suicide include: unemployment, social withdrawal/isolation, and loss (financial, interpersonal, professional). Protective factors for this patient include: responsibility to others (children, family). Considering these factors, the overall suicide risk at this point appears to be high. Patient is appropriate for outpatient follow up.   Chief Complaint:  Chief Complaint  Patient presents with   IVC   Suicidal   Psychiatric Evaluation   Visit Diagnosis: Major Depressive Disorder, Recurrent, Severe, without psychotic features; Substance Induced Mood Disorder; Substance Use Disorder   Keith Miller is a 51 y/o male presenting to [facility], involuntarily. Patient says that police woke him up at home and told him that he had to come to the Emergency Department for a mental health evaluation. He believes that his sister IVC'd him related to concerns of him wanting to harm himself. Patient states that he was isolating himself yesterday and believes his sister assumed that he was going to hurt himself. Denies that he verbalized thoughts to  harm himself and/or made a gesture/attempt to hurt himself.   Patient denies hx of suicidal thoughts. Denies that he has any current suicidal thoughts. Denies past suicidal attempts and/or gestures. Denies access to means, specifically firearms. Protective factor: family in general, pets, my son.  Denies hx of self-injurious behaviors. He has a hx of depression and was dx's in 2017. Current depressive symptoms: fatigue, guilt, tearful, and insomnia. He sleeps 3-4 hrs per night. Appetite has been poor. However, starting to improve. No significant weight loss and/or gain. Family hx of depression: mother who is now deceased. Current stressor: I received a DWI charge on Friday, February 09, 2022.   Patient denies homicidal ideations. Denies hx of aggressive and/or assaultive behaviors. He has no other legal issues other than the DWI noted above. Denies AVH's.   Patient denies drug use. However, has a hx of alcohol abuse. He started drinking sometime in [employer]. His average amount of use for the past 2-3 months are binges. He describes his current use as very sporadic. Last use was yesterday 11am to 5pm. He reports drinking 2 pints of Vodka. No current withdrawal symptoms because they gave me Ativan. Prior to taking the Ativan he had some tremors. Denies hx of seizures, blackout's, and DT's.   He has received treatment at [facility] in 2017. Longest period of sobriety is 4 yrs. Denies a family hx of substance use.   Denies hx of inpatient psychiatric treatment. He does not have a therapist and/or psychiatrist. However, looking for outpatient psychiatric providers due to his recent DWI.   Patient recently lost his job, after his position with the company was being phased out. Highest level of education is a college degree. He was raised by mother and father and has 2 older sisters. Patient is single,  in a long-term relationship x15 yrs. He has a 14 y/o son but says it's not biologically  his son. Currently lives with girlfriend and son. Denies hx of abuse-sexual, emotional, verbal, physical.   Patient asked how does he feel that [facility] could best help him today. He replies, I would like to return back to [facility]. His family has already reached out to [facility] and he believes his admission date was yesterday or today.   Patient is ok with this Clinician contacting his sister for collateral information. Her name is [name] [phone]. Per [name], her [facility] has verbalized to multiple family members that he is "going to kill himself". He has made these statements to their other sister, a long time friend, the long time friends mother. He reportedly asked a friend for his gun and when asked the reason why he needed the gun his response was "I need to go to [employer]". According to Jacklyn Shell is a local shooting range. Additional concerns are related to patient's extensive drinking in the past 2-3 months. He at one time called himself a "weekend warrior", meaning, he only  would binge drinks on the weekends. According to his sister he is now drinking daily. She has witnessed patient having "convulsions on the floor" and says that this is something she can't handle. Additionally, received 2nd hand information that last Friday he was charged with a DWI, driving w/o license, and a hit and run. States, "I know my brother is mad at me for committing him but me and his family would rather him be mad at [place] versus dead".   CCA Screening, Triage and Referral (STR)  Patient Reported Information How did you hear about [place]? Self  What Is the Reason for Your Visit/Call Today? Keith Miller is a 51 y/o male presenting to [facility], involuntarily. Patient says that police woke him up at home and told him that he had to come to the Emergency Department for a mental health evaluation. He believes that his sister IVCd him related to concerns of him wanting to harm himself. Patient states that  he was isolating himself yesterday and believes his sister assumed that he was going to hurt himself. Denies that he verbalized thoughts to harm himself and/or made a gesture/attempt to hurt himself.   Patient denies hx of suicidal thoughts. Denies that he has any current suicidal thoughts. Denies past suicidal attempts and/or gestures. Denies access to means, specifically firearms. Protective factor: family in general, pets, my son.  Denies hx of self-injurious behaviors. He has a hx of depression and was dxs in 2017. Current depressive symptoms: fatigue, guilt, tearful, and insomnia. He sleeps 3-4 hrs per night. Appetite has been poor. However, starting to improve. No significant weight loss and/or gain. Family hx of depression: mother who is now deceased. Current stressor: I received a DWI charge on Friday, February 09, 2022.   Patient denies homicidal ideations. Denies hx of aggressive and/or assaultive behaviors. He has no other legal issues other than the DWI noted above. Denies AVHs.   Patient denies drug use. However, has a hx of alcohol abuse. He started drinking sometime in [employer]. His average amount of use for the past 2-3 months are binges. He describes his current use as very sporadic. Last use was yesterday 11am to 5pm. He reports drinking 2 pints of Vodka. No current withdrawal symptoms because they gave me Ativan. Prior to taking the Ativan he had some tremors. Denies hx of seizures, blackouts, and  DTs.   He has received treatment at [facility] in 2017. Longest period of sobriety is 4 yrs. Denies a family hx of substance use.   Denies hx of inpatient psychiatric treatment. He does not have a therapist and/or psychiatrist. However, looking for outpatient psychiatric providers due to his recent DWI.  How Long Has This Been Causing You Problems? > than 6 months  What Do You Feel Would Help You the Most Today? Treatment for Depression or other mood problem; Alcohol or Drug  Use Treatment   Have You Recently Had Any Thoughts About Hurting Yourself? No  Are You Planning to Commit Suicide/Harm Yourself At This time? No   Have you Recently Had Thoughts About [place]? No  Are You Planning to Harm Someone at This Time? No  Explanation: No data recorded  Have You Used Any Alcohol or Drugs in the Past 24 Hours? Yes  How Long Ago Did You Use Drugs or Alcohol? No data recorded What Did You Use and How Much? Patient denies drug use. However, has a hx of alcohol abuse. He started drinking sometime in [employer]. His average amount of use for the past 2-3 months are binges. He describes his current use as very sporadic. Last use was yesterday 11am to 5pm. He reports drinking 2 pints of Vodka. No current withdrawal symptoms because they gave me Ativan. Prior to taking the Ativan he had some tremors. Denies hx of seizures, blackouts, and DTs.   Do You Currently Have a Therapist/Psychiatrist? Yes  Name of Therapist/Psychiatrist: Denies hx of inpatient psychiatric treatment. He does not have a therapist and/or psychiatrist. However, looking for outpatient psychiatric providers due to his recent DWI.   Have You Been Recently Discharged From Any Office Practice or Programs? No  Explanation of Discharge From Practice/Program: No data recorded    CCA Screening Triage Referral Assessment Type of Contact: Tele-Assessment  Telemedicine Service Delivery: Telemedicine service delivery: This service was provided via telemedicine using a 2-way, interactive audio and video technology  Is this Initial or Reassessment? Initial Assessment  Date Telepsych consult ordered in [facility]:  02/13/22  Time Telepsych consult ordered in [facility]:  1132  Location of Assessment: [facility] ED  Provider Location: [facility] Assessment Services   Collateral Involvement: Pt gave verbal consent for friend, Corene Cornea, to be present in room throughout assessment   Does Patient Have a [place]? No data recorded Name and Contact of Legal Guardian: No data recorded If Minor and Not Living with Parent(s), Who has Custody? No data recorded Is CPS involved or ever been involved? Never  Is APS involved or ever been involved? Never   Patient Determined To Be At Risk for Harm To Self or Others Based on Review of Patient Reported Information or Presenting Complaint? Yes, for Self-Harm  Method: No data recorded Availability of Means: No data recorded Intent: No data recorded Notification Required: No data recorded Additional Information for Danger to Others Potential: No data recorded Additional Comments for Danger to Others Potential: No data recorded Are There Guns or Other Weapons in Your Home? No data recorded Types of Guns/Weapons: No data recorded Are These Weapons Safely Secured?                            No data recorded Who Could Verify You Are Able To Have These Secured: No data recorded Do You Have any Outstanding Charges, Pending Court Dates, Parole/Probation?  No data recorded Contacted To Inform of Risk of Harm To Self or Others: No data recorded   Does Patient Present under Involuntary Commitment? No  IVC Papers Initial File Date: No data recorded  [place] of Residence: [place]   Patient Currently Receiving the Following Services: -- (Patient has no psychiatric services in place at this time.)   Determination of Need: Emergent (2 hours)   Options For Referral: Medication Management; Chemical Dependency Intensive Outpatient Therapy (CDIOP); Facility-Based Crisis; Inpatient Hospitalization     CCA Biopsychosocial Patient Reported Schizophrenia/Schizoaffective Diagnosis in Past: No   Strengths: pt actively seeking help for alcohol use   Mental Health Symptoms Depression:   Change in energy/activity; Fatigue; Hopelessness; Worthlessness; Increase/decrease in appetite; Sleep (too much or little)   Duration of Depressive  symptoms:  Duration of Depressive Symptoms: Greater than two weeks   Mania:   Racing thoughts   Anxiety:    Worrying; Sleep; Irritability; Fatigue   Psychosis:   None   Duration of Psychotic symptoms:    Trauma:   None   Obsessions:   None   Compulsions:   None   Inattention:   None   Hyperactivity/Impulsivity:   None   Oppositional/Defiant Behaviors:   None   Emotional Irregularity:   None   Other Mood/Personality Symptoms:  No data recorded   Mental Status Exam Appearance and self-care  Stature:   Average   Weight:   Thin   Clothing:   Neat/clean   Grooming:   Normal   Cosmetic use:   None   Posture/gait:   Normal   Motor activity:   Slowed   Sensorium  Attention:   Distractible   Concentration:   Preoccupied   Orientation:   X5   Recall/memory:   Normal   Affect and Mood  Affect:   Depressed   Mood:   Depressed   Relating  Eye contact:   Normal   Facial expression:   Depressed   Attitude toward examiner:   Cooperative   Thought and Language  Speech flow:  Clear and Coherent   Thought content:   Appropriate to Mood and Circumstances   Preoccupation:   Suicide; Guilt   Hallucinations:   None   Organization:  No data recorded  Transport planner of Knowledge:   Good   Intelligence:   Average   Abstraction:   Normal   Judgement:   Dangerous   Reality Testing:   Variable   Insight:   Gaps   Decision Making:   Impulsive   Social Functioning  Social Maturity:   Impulsive; Irresponsible   Social Judgement:   Heedless   Stress  Stressors:   Family conflict; Grief/losses; Housing; Scientist, research (physical sciences); Work; Relationship; Financial   Coping Ability:   Overwhelmed; Exhausted   Skill Deficits:   Self-control   Supports:   Friends/Service system     Religion: Religion/Spirituality Are You A Religious Person?: No  Leisure/Recreation: Leisure / Recreation Do You Have Hobbies?:  Yes Leisure and Hobbies: spending time with family and friends  Exercise/Diet: Exercise/Diet Have You Gained or Lost A Significant Amount of Weight in the Past Six Months?: No Do You Follow a Special Diet?: No Do You Have Any Trouble Sleeping?: Yes Explanation of Sleeping Difficulties: insomnia most nights--hard to fall asleep and stay asleep   CCA Employment/Education Employment/Work Situation: Employment / Work Situation Employment Situation: Unemployed Patient's Job has Been Impacted by Current Illness: Yes Describe how Patient's Job has Been Impacted: pt recently  lost his job Has Patient ever Been in the [employer]?: No  Education: Education Is Patient Currently Attending School?: No Did Physicist, medical?: No Did You Have An Individualized Education Program (IIEP): No Did You Have Any Difficulty At School?: No Patient's Education Has Been Impacted by Current Illness: No   CCA Family/Childhood History Family and Relationship History: Family history Marital status: Long term relationship Long term relationship, how long?: 16 years What types of issues is patient dealing with in the relationship?: pts long term girlfriend is in the process of moving out Additional relationship information: pt is very close with girlfriends son--"my son" Does patient have children?: Yes How many children?: 1 How is patient's relationship with their children?: son  Childhood History:  Childhood History By whom was/is the patient raised?: Both parents Did patient suffer any verbal/emotional/physical/sexual abuse as a child?: No Did patient suffer from severe childhood neglect?: No Has patient ever been sexually abused/assaulted/raped as an adolescent or adult?: No Was the patient ever a victim of a crime or a disaster?: No Witnessed domestic violence?: No Has patient been affected by domestic violence as an adult?: No  Child/Adolescent Assessment:     CCA Substance Use Alcohol/Drug  Use: Alcohol / Drug Use Pain Medications: SEE MAR Prescriptions: SEE MAR Over the Counter: SEE MAR History of alcohol / drug use?: Yes Substance #1 Name of Substance 1: ETOH: Patient denies drug use. However, has a hx of alcohol abuse. He started drinking sometime in [employer]. His average amount of use for the past 2-3 months are binges. He describes his current use as very sporadic. Last use was yesterday 11am to 5pm. He reports drinking 2 pints of Vodka. No current withdrawal symptoms because they gave me Ativan. Prior to taking the Ativan he had some tremors. Denies hx of seizures, blackouts, and DTs. 1 - Age of First Use: High School 1 - Amount (size/oz): variable amounts--pt reports 3 bottles of wine or 2 pints of vodka; binge drinks 1 - Frequency: "I was drinking on the weekends 1 - Duration: on-going 1 - Last Use / Amount: last night 1 - Method of Aquiring: legal 1- Route of Use: oral/drink                       ASAM's:  Six Dimensions of Multidimensional Assessment  Dimension 1:  Acute Intoxication and/or Withdrawal Potential:   Dimension 1:  Description of individual's past and current experiences of substance use and withdrawal: recent intoxication  Dimension 2:  Biomedical Conditions and Complications:   Dimension 2:  Description of patient's biomedical conditions and  complications: health related concerns  Dimension 3:  Emotional, Behavioral, or Cognitive Conditions and Complications:  Dimension 3:  Description of emotional, behavioral, or cognitive conditions and complications: suicidal thoughts  Dimension 4:  Readiness to Change:  Dimension 4:  Description of Readiness to Change criteria: pt willing to make changes  Dimension 5:  Relapse, Continued use, or Continued Problem Potential:  Dimension 5:  Relapse, continued use, or continued problem potential critiera description: pt has history of relapse  Dimension 6:  Recovery/Living Environment:  Dimension  6:  Recovery/Iiving environment criteria description: big change to environment--partner is leaving  ASAM Severity Score: ASAM's Severity Rating Score: 9  ASAM Recommended Level of Treatment:     Substance use Disorder (SUD) Substance Use Disorder (SUD)  Checklist Symptoms of Substance Use: Continued use despite having a persistent/recurrent physical/psychological problem caused/exacerbated by use, Continued use despite  persistent or recurrent social, interpersonal problems, caused or exacerbated by use, Persistent desire or unsuccessful efforts to cut down or control use, Recurrent use that results in a failure to fulfill major role obligations (work, school, home)  Recommendations for Services/Supports/Treatments: Recommendations for Services/Supports/Treatments Recommendations For Services/Supports/Treatments: Brewing technologist, CD-IOP Intensive Chemical Dependency Program, Medication Management  Discharge Disposition:    DSM5 Diagnoses: Patient Active Problem List   Diagnosis Date Noted   Substance induced mood disorder ([place])    Suicidal ideation    Alcohol withdrawal syndrome with complication, with unspecified complication ([place]) 0000000   Alcohol withdrawal ([place]) 01/02/2022     Referrals to Alternative Service(s): Referred to Alternative Service(s):   Place:   Date:   Time:    Referred to Alternative Service(s):   Place:   Date:   Time:    Referred to Alternative Service(s):   Place:   Date:   Time:    Referred to Alternative Service(s):   Place:   Date:   Time:     Waldon Merl, Counselor

## 2022-02-13 NOTE — ED Provider Notes (Signed)
Emergency Medicine Observation Re-evaluation Note  Keith Miller is a 51 y.o. male, seen on rounds today.  Pt initially presented to the ED for complaints of IVC, Suicidal, and Psychiatric Evaluation Currently, the patient is resting, just finished TTS consult.  Physical Exam  BP (!) 146/99 (BP Location: Left Arm)    Pulse (!) 101    Temp 97.9 F (36.6 C) (Oral)    Resp (!) 28    Ht 6' (1.829 m)    Wt 90.7 kg    SpO2 94%    BMI 27.12 kg/m  Physical Exam General: no acute distress Cardiac: RRR Lungs: clear Psych: denies SI  ED Course / MDM  EKG:EKG Interpretation  Date/Time:  Monday February 12 2022 22:25:38 EST Ventricular Rate:  104 PR Interval:  139 QRS Duration: 91 QT Interval:  323 QTC Calculation: 425 R Axis:   34 Text Interpretation: Sinus tachycardia Otherwise within normal limits When compared with ECG of 02/10/2022, HEART RATE has increased Confirmed by Dione Booze ([id number]) on 02/13/2022 6:52:19 AM  I have reviewed the labs performed to date as well as medications administered while in observation.  Recent changes in the last 24 hours include prn and CIWA meds ordered.  Plan  Current plan is for inpatient. Keith Miller is under involuntary commitment.      Pricilla Loveless, MD 02/13/22 1041

## 2022-02-14 ENCOUNTER — Encounter (HOSPITAL_COMMUNITY): Payer: Self-pay

## 2022-02-14 DIAGNOSIS — F1020 Alcohol dependence, uncomplicated: Secondary | ICD-10-CM

## 2022-02-14 LAB — LIPID PANEL
Cholesterol: 142 mg/dL (ref 0–200)
HDL: 61 mg/dL (ref >40)
LDL Cholesterol: 58 mg/dL (ref 0–99)
Total CHOL/HDL Ratio: 2.3 RATIO
Triglycerides: 114 mg/dL (ref <150)
VLDL: 23 mg/dL (ref 0–40)

## 2022-02-14 LAB — TSH: TSH: 1.295 u[IU]/mL (ref 0.350–4.500)

## 2022-02-14 LAB — HEMOGLOBIN A1C
Hgb A1c MFr Bld: 4.6 % — ABNORMAL LOW (ref 4.8–5.6)
Mean Plasma Glucose: 85.32 mg/dL

## 2022-02-14 MED ORDER — ENSURE ENLIVE PO LIQD
237.0000 mL | Freq: Two times a day (BID) | ORAL | Status: DC
  Administered 2022-02-15 – 2022-02-17 (×5): 237 mL via ORAL
  Filled 2022-02-14 (×9): qty 237

## 2022-02-14 NOTE — BHH Counselor (Signed)
Adult Comprehensive Assessment  Patient ID: Keith Miller, male   DOB: 11/11/1971, 50 y.o.   MRN: [id number]  Information Source: Information source: Patient  Current Stressors:  Patient states their primary concerns and needs for treatment are:: "I was charged with a DUI Feb 10 and then my life kinda went in a tailspin emotionally. I was also going through I guess you could call it a split with my girlfriend. Monday I went to the park and I guess no one could get ahold of me for awhile and people got worried. 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 Pt reports that he got a DUI Feb 10th Bereavement / Loss: Denies  Living/Environment/Situation:  Living Arrangements: Spouse/significant other, Children Living conditions (as described by patient or guardian): Pt reports that he has his own home but that his girlfriend just bought her own and he would like to move in with her post-rehab Who else lives in the home?: girlfriend and her son How long has patient lived in current situation?: 12 years What is atmosphere in current home: Comfortable,  Paramedic, Supportive  Family History:  Long term relationship, how long?: 16 years What types of issues is patient dealing with in the relationship?: pts long term girlfriend is in the process of moving out due to recently buying her own home Are you sexually active?: Yes What is your sexual orientation?: heterosexual Does patient have children?: Yes How many children?: 1 How is patient's relationship with their children?: Pt has one step-son via his girlfriend and reports that they have a good relationship  Childhood History:  By whom was/is the patient raised?: Both parents Additional childhood history information: Pt reports that he grew up in [place]  Description of patient's relationship with caregiver when they were a child: "amazing" Patient's description of current relationship with people who raised him/her: Pt reports that his mother is deceased and that he is the primary caretaker of his father who has health related issues due to having Polio at a young age How were you disciplined when you got in trouble as a child/adolescent?: "It switched between mom and dad. She was 0-100 and he was more even and they would come together to do it." Does patient have siblings?: Yes Number of Siblings: 2 (sisters) Description of patient's current relationship with siblings: Pt reports that he has an "excellent" relationship Did patient suffer any verbal/emotional/physical/sexual abuse as a child?: No Did patient suffer from severe childhood neglect?: No Has patient ever been sexually abused/assaulted/raped as an adolescent or adult?: No Was the patient ever a victim of a crime or a disaster?: No Witnessed domestic violence?: No  Has patient been affected by domestic violence as an adult?: No  Education:  Highest grade of school patient has completed: Oncologist Currently a student?: No Learning disability?: No  Employment/Work Situation:   Employment Situation:  Unemployed Describe how Patient's Job has Been Impacted: pt recently lost his job What is the Longest Time Patient has Held a Job?: 18 years Where was the Patient Employed at that Time?: [employer] Has Patient ever Been in the [employer]?: No  Financial Resources:   Psychologist, prison and probation services, Income from spouse Does patient have a Lawyer or guardian?: No  Alcohol/Substance Abuse:   What has been your use of drugs/alcohol within the last 12 months?: Pt reports prior to relapsing he had 4 years of sobriety. Pt reports that for 6 months he was only drinking casually on the weekends. Pt reports that over the last 6 months he has began to binge more where he will go through periods where he drinks daily and then not drink for a week or more at a time. Pt states that when he does drink it is "all over the place" If attempted suicide, did drugs/alcohol play a role in this?: No Alcohol/Substance Abuse Treatment Hx: Past Tx, Inpatient, Past Tx, Outpatient If yes, describe treatment: Pt reports completing inpatient treatment at [facility] in 2017 and then completing their IOP program following that Has alcohol/substance abuse ever caused legal problems?: Yes (Pt got a DUI on Feb 09 2022)  Social Support System:   Patient's Community Support System: Production assistant, radio System: "girlfriend, son, dad, sisters and 3-4 friends" Type of faith/religion: Denies  Leisure/Recreation:   Do You Have Hobbies?: Yes Leisure and Hobbies: "excercising and attending athletics...esp ones involving my son. He is a pretty good soccer and football player."  Strengths/Needs:   What is the patient's perception of their strengths?: "My ability to relate to others and communicate  Discharge Plan:   Currently receiving community mental health services: No Patient states concerns and preferences for aftercare planning are: Pt would like to go to [facility] for treatment Does  patient have access to transportation?: Yes (family/girlfriend) Does patient have financial barriers related to discharge medications?: No (private insurance and family support) Plan for living situation after discharge: Pt plans to go to [facility] for substance use treatment upon discharge Will patient be returning to same living situation after discharge?: No  Summary/Recommendations:  Keith Miller is a 51 y.o., caucasian male was admitted after being IVCed by his sister who states pt told multiple family members that he was going to kill himself and had asked his long time friend to borrow a gun of his. Pt has a hx of alcohol abuse. Recent Stressors include getting a DUI approx 5 days ago and his girlfriend moving out. Pt currently sees no outpatient providers. While here, Keith Miller can benefit from crisis stabilization, medication management, therapeutic milieu, and referrals for services.      Keith Miller. 02/14/2022

## 2022-02-14 NOTE — Group Note (Signed)
Date:  02/14/2022 Time:  11:07 AM  Group Topic/Focus:  Personal Choices and Values:   The focus of this group is to help patients assess and explore the importance of values in their lives, how their values affect their decisions, how they express their values and what opposes their expression.    Participation Level:  Active  Participation Quality:  Appropriate  Affect:  Appropriate  Cognitive:  Appropriate  Insight: Appropriate  Engagement in Group:  Engaged  Modes of Intervention:  Discussion  Additional Comments:  pt was engaged in group  Garvin Fila 02/14/2022, 11:07 AM

## 2022-02-14 NOTE — B[facility] Group Notes (Signed)
Adult Psychoeducational Group Note  Date:  02/14/2022 Time:  2:16 PM  Group Topic/Focus:  Wellness Toolbox:   The focus of this group is to discuss various aspects of wellness, balancing those aspects and exploring ways to increase the ability to experience wellness.  Patients will create a wellness toolbox for use upon discharge.  Participation Level:  Minimal  Participation Quality:  Appropriate  Affect:  Appropriate  Cognitive:  Appropriate  Insight: Appropriate  Engagement in Group:  Engaged  Modes of Intervention:  Activity  Additional Comments:  Patient attended and participated in the relaxation activity.  Keith Miller 02/14/2022, 2:16 PM

## 2022-02-14 NOTE — Group Note (Signed)
Recreation Therapy Group Note   Group Topic:Stress Management  Group Date: 02/14/2022 Start Time: 0930 End Time: 0954 Facilitators: Caroll Rancher, LRT,CTRS Location: [address] Dayroom   Goal Area(s) Addresses:  Patient will actively participate in stress management techniques presented during session.  Patient will successfully identify benefit of practicing stress management post d/c.   Group Description: Guided Imagery. LRT provided education, instruction, and demonstration on practice of visualization via guided imagery. Patient was asked to participate in the technique introduced during session. LRT debriefed including topics of mindfulness, stress management and specific scenarios each patient could use these techniques. Patients were given suggestions of ways to access scripts post d/c and encouraged to explore Youtube and other apps available on smartphones, tablets, and computers.   Affect/Mood: Appropriate   Participation Level: Active   Participation Quality: Independent   Behavior: Appropriate   Speech/Thought Process: Focused   Insight: Good   Judgement: Good   Modes of Intervention: Script, [place]   Patient Response to Interventions:  Attentive   Education Outcome:  Acknowledges education and In group clarification offered    Clinical Observations/Individualized Feedback: Pt attended and participated in group activity.    Plan: Continue to engage patient in RT group sessions 2-3x/week.   Caroll Rancher, LRT,CTRS 02/14/2022 12:05 PM

## 2022-02-14 NOTE — Group Note (Signed)
Date:  02/14/2022 Time:  9:41 AM  Group Topic/Focus:  Orientation:   The focus of this group is to educate the patient on the purpose and policies of crisis stabilization and provide a format to answer questions about their admission.  The group details unit policies and expectations of patients while admitted.    Participation Level:  Minimal  Participation Quality:  Appropriate  Affect:  Appropriate  Cognitive:  Appropriate  Insight: Appropriate  Engagement in Group:  Engaged  Modes of Intervention:  Discussion  Additional Comments:   Jaquita Rector 02/14/2022, 9:41 AM

## 2022-02-14 NOTE — Group Note (Signed)
LCSW Group Therapy Note  Group Date: 02/14/2022 Start Time: 1300 End Time: 1400   Type of Therapy and Topic:  Group Therapy: Anger Cues and Responses  Participation Level:  Active   Description of Group:   In this group, patients learned how to recognize the physical, cognitive, emotional, and behavioral responses they have to anger-provoking situations.  They identified a recent time they became angry and how they reacted.  They analyzed how their reaction was possibly beneficial and how it was possibly unhelpful.  The group discussed a variety of healthier coping skills that could help with such a situation in the future.  Focus was placed on how helpful it is to recognize the underlying emotions to our anger, because working on those can lead to a more permanent solution as well as our ability to focus on the important rather than the urgent.  Therapeutic Goals: Patients will remember their last incident of anger and how they felt emotionally and physically, what their thoughts were at the time, and how they behaved. Patients will identify how their behavior at that time worked for them, as well as how it worked against them. Patients will explore possible new behaviors to use in future anger situations. Patients will learn that anger itself is normal and cannot be eliminated, and that healthier reactions can assist with resolving conflict rather than worsening situations.  Summary of Patient Progress:  Pt was active during the group. Pt demonstrated insight into the subject matter, was respectful of peers, and participated throughout the entire session.  Therapeutic Modalities:   Cognitive Behavioral Therapy    Chrys Racer 02/14/2022  2:26 PM

## 2022-02-14 NOTE — Plan of Care (Signed)
Nurse discussed coping skills with patient.  

## 2022-02-14 NOTE — Progress Notes (Signed)
D:  Patient's self inventory sheet, patient has poor sleep, sleep medication helpful.  Good appetite, high energy level, good concentration.  Rated depression and hopeless 2, anxiety 4.  Denied withdrawals.  Denied SI.  Denied physical problems.  Denied physical pain.  Denied pain medicine. Plans to speak to MD.  Does have discharge plans. A:  Medications administered per MD orders.  Emotional support and encouragement given patient. R:  Safety maintained with 15 minute checks.

## 2022-02-14 NOTE — B[facility] Suicide Risk Assessment (Signed)
[facility] Admission Suicide Risk Assessment   Nursing information obtained from:  Patient Demographic factors:  Male, Caucasian, Access to firearms Current Mental Status:  NA Loss Factors:  Financial problems / change in socioeconomic status Historical Factors:  Impulsivity Risk Reduction Factors:  Positive social support  Total Time spent with patient: 45 minutes Principal Problem: MDD (major depressive disorder), recurrent episode, severe (HCC) Diagnosis:  Principal Problem:   MDD (major depressive disorder), recurrent episode, severe (HCC)  Subjective Data:   Patient is a 51 year old male with a psychiatric history of major depressive disorder and alcohol use disorder, who was admitted to the psychiatric unit for evaluation and treatment of worsening depression, and suicidal thoughts with intent and plan.  We are also treating alcohol withdrawal.   On my evaluation, the patient reports multiple stressors contributing to his worsening depression and suicidal thoughts with plan and intent to obtain a gun, including: Verification from severe alcohol use disorder including strained relationships with girlfriend and family, strained finances, loss of job, and legal issues including recent DUI.  On my assessment, the patient minimizes symptoms, and this is confirmed after obtaining collateral information, see below.   The patient reports that his mood has been depressed for many months.  Reports anhedonia.  Reports that sleep is decreased and energy level is decreased.  Reports that appetite is up and down, coinciding with alcohol binges and periods of sobriety in between binges.  Reports concentration is intact.  At the time of the interview today, the patient denies having suicidal thoughts.  Denies having homicidal thoughts.  Patient reports that his anxiety is up and down, but denies he has generalized anxiety or this is chronic.   The patient denies past or current symptoms of mania/hypomania,  including symptoms of: grandiosity and inflated self-esteem, decreased need for sleep, pressured speech, flight of ideas and racing thoughts, distractibility or inattention, risk-taking activities, and denies impulse to participate in activities that have dangerous, harmful, or painful potential consequences.     Denies having auditory hallucinations. Patient denies having visual hallucinations.  There is no apparent delusional thought content.  There are no apparent paranoid thoughts.   Patient denies having a history of exposure to life-threatening trauma.     Collateral:   Francene Finders [phone] (sister) person who completed IVC.  Per Selena Batten, he has been 'somewhat suicidal in the past' including statements of 'I am going to kill myself.' He continues with those text messages to Selena Batten, his other sister, his girl friend and other friends. On this instance, he had communicated with one of pt's friends about borrowing a pistol. That friend was unaware that pt was drinking again. Per kim, friend was not home but told pt that he could rent a gun at the range; however range was closed on Monday.  Selena Batten states that the pt most recently communicated suicidal statements on Monday, via a text to his sister, when he was parked outside thier childhood home, took picture, sent them the picture and communicated something to the effect of 'I just can't do this anymore. '    Per girl friend, Thayer Headings, [phone] (pt provided consent during H&P interview to contact this person):  GF describes alcohol use that is much much more serious than the patient describes. GF long periods of binging and this causes severe repercussions on himself, his health (multiple falls with head injuries and concussions), job issues, legal issues, his relationships and family.  GF reports that pt is 100% controlled by the  alcohol use and he is not control over his own actions.  Starting around November 12th, pt started texting friends  that he wanted to kill himself. Friends had taken pt to hospital. Then there were cycles of binging and sobriety. Alcohol use triggered by stress, anniversary of loss, or when GF is out of town.  After the DUI on 02-09-2022, GF was concerned that pt shouldn't be alone due to her concern for his safety. Per GF, pt had contacted a friend, talking about that he was going to kill himself.  GF reports that pt will say what he needs to for discharge.     Continued Clinical Symptoms:  Alcohol Use Disorder Identification Test Final Score (AUDIT): 24 The "Alcohol Use Disorders Identification Test", Guidelines for Use in Primary Care, Second Edition.  World Science writer [facility]). Score between 0-7:  no or low risk or alcohol related problems. Score between 8-15:  moderate risk of alcohol related problems. Score between 16-19:  high risk of alcohol related problems. Score 20 or above:  warrants further diagnostic evaluation for alcohol dependence and treatment.   CLINICAL FACTORS:   Depression:   Anhedonia Impulsivity Insomnia Alcohol/Substance Abuse/Dependencies Unstable or Poor Therapeutic Relationship Previous Psychiatric Diagnoses and Treatments   Musculoskeletal: Strength & Muscle Tone: within normal limits Gait & Station: normal Patient leans: N/A  Psychiatric Specialty Exam:  Presentation  General Appearance: Disheveled  Eye Contact:Good  Speech:Normal Rate  Speech Volume:Normal  Handedness:Right   Mood and Affect  Mood:Anxious; Dysphoric; Depressed  Affect:Restricted; Congruent   Thought Process  Thought Processes:Linear  Descriptions of Associations:Intact  Orientation:Full (Time, Place and Person)  Thought Content:Logical  History of Schizophrenia/Schizoaffective disorder:No  Duration of Psychotic Symptoms:No data recorded Hallucinations:Hallucinations: None  Ideas of Reference:None  Suicidal Thoughts:Suicidal Thoughts: No (pt admitted for suicidal  statements including asking a friend for a gun to kill himself.)  Homicidal Thoughts:Homicidal Thoughts: No   Sensorium  Memory:Immediate Good; Recent Good; Remote Good  Judgment:Poor  Insight:Poor   Executive Functions  Concentration:Fair  Attention Span:Fair  Recall:Fair  Fund of Knowledge:Fair  Language:Good   Psychomotor Activity  Psychomotor Activity:Psychomotor Activity: Restlessness; Tremor   Assets  Assets:Desire for Improvement; Intimacy; Social Support   Sleep  Sleep:Sleep: Good    Physical Exam: Physical Exam See H&P  ROS See H&P  Blood pressure (!) 130/94, pulse 79, temperature 97.7 F (36.5 C), resp. rate 19, height 6' (1.829 m), weight 93.4 kg, SpO2 97 %. Body mass index is 27.94 kg/m.   COGNITIVE FEATURES THAT CONTRIBUTE TO RISK:  None    SUICIDE RISK:   Severe:  Frequent, intense, and enduring suicidal ideation, specific plan, no subjective intent, but some objective markers of intent (i.e., choice of lethal method), the method is accessible, some limited preparatory behavior, evidence of impaired self-control, severe dysphoria/symptomatology, multiple risk factors present, and few if any protective factors, particularly a lack of social support.  PLAN OF CARE:   See H&P for assessment, diagnosis list, and plan  I certify that inpatient services furnished can reasonably be expected to improve the patient's condition.   Cristy Hilts, MD 02/14/2022, 1:16 PM

## 2022-02-14 NOTE — Progress Notes (Signed)
°   02/14/22 2147  Psych Admission Type (Psych Patients Only)  Admission Status Involuntary  Psychosocial Assessment  Patient Complaints Anxiety;Depression  Eye Contact Fair  Facial Expression Anxious  Affect Appropriate to circumstance  Speech Logical/coherent  Interaction Minimal;Guarded  Motor Activity Other (Comment) (WDL)  Appearance/Hygiene Unremarkable  Behavior Characteristics Anxious;Cooperative  Mood Anxious;Depressed  Thought Process  Coherency WDL  Content Blaming others  Delusions None reported or observed  Perception WDL  Hallucination None reported or observed  Judgment Poor  Confusion None  Danger to Self  Current suicidal ideation? Denies  Danger to Others  Danger to Others None reported or observed

## 2022-02-14 NOTE — Progress Notes (Signed)
NUTRITION ASSESSMENT  Keith Miller identified as at risk on the Malnutrition Screen Tool  INTERVENTION: 1. Supplements: Ensure Plus High Protein po BID, each supplement provides 350 kcal and 20 grams of protein.  2. Continue vitamin supplements (MVI, thiamine) 3. Consider folic acid  NUTRITION DIAGNOSIS: Unintentional weight loss related to sub-optimal intake as evidenced by Keith Miller report.   Goal: Keith Miller to meet >/= 90% of their estimated nutrition needs.  Monitor:  PO intake  Assessment:  Keith Miller admitted with depression and substance abuse. Keith Miller reports fluctuating appetite which is dependent on if Keith Miller is in a period of sobriety. Keith Miller reports drinking 3-4 pints of vodka daily.  Keith Miller is receiving daily MVI and thiamine. Will also order Ensure supplements.    Height: Ht Readings from Last 1 Encounters:  02/13/22 6' (1.829 m)    Weight: Wt Readings from Last 1 Encounters:  02/13/22 93.4 kg    Weight Hx: Wt Readings from Last 10 Encounters:  02/13/22 93.4 kg  02/12/22 90.7 kg  02/10/22 90.7 kg  01/21/22 95.3 kg  11/09/21 90.7 kg    BMI:  Body mass index is 27.94 kg/m. Keith Miller meets criteria for overweight based on current BMI.  Estimated Nutritional Needs: Kcal: 25-30 kcal/kg Protein: > 1 gram protein/kg Fluid: 1 ml/kcal  Diet Order:  Diet Order             Diet regular Room service appropriate? Yes; Fluid consistency: Thin  Diet effective now                  Keith Miller is also offered choice of unit snacks mid-morning and mid-afternoon.   Lab results and medications reviewed.   Tilda Franco, MS, RD, LDN Inpatient Clinical Dietitian Contact information available via Amion

## 2022-02-14 NOTE — BH IP Treatment Plan (Signed)
Interdisciplinary Treatment and Diagnostic Plan Update  02/14/2022 Time of Session:  Keith Miller MRN: [id number]  Principal Diagnosis: MDD (major depressive disorder), recurrent episode, severe ([place])  Secondary Diagnoses: Principal Problem:   MDD (major depressive disorder), recurrent episode, severe ([place])   Current Medications:  Current Facility-Administered Medications  Medication Dose Route Frequency Provider Last Rate Last Admin   acetaminophen (TYLENOL) tablet 650 mg  650 mg Oral Q6H PRN Starkes-Perry, Gayland Curry, FNP       alum & mag hydroxide-simeth (MAALOX/MYLANTA) 200-200-20 MG/5ML suspension 30 mL  30 mL Oral Q4H PRN Starkes-Perry, Gayland Curry, FNP       chlorproMAZINE (THORAZINE) tablet 25 mg  25 mg Oral TID PRN Suella Broad, FNP       escitalopram (LEXAPRO) tablet 20 mg  20 mg Oral Daily Suella Broad, FNP   20 mg at 02/14/22 7001   hydrOXYzine (ATARAX) tablet 25 mg  25 mg Oral Q6H PRN Nicholes Rough, NP   25 mg at 02/13/22 2142   lip balm (CARMEX) ointment   Topical Once Suella Broad, FNP       loperamide (IMODIUM) capsule 2-4 mg  2-4 mg Oral PRN Nicholes Rough, NP       LORazepam (ATIVAN) tablet 1 mg  1 mg Oral Q6H PRN Nicholes Rough, NP       LORazepam (ATIVAN) tablet 1 mg  1 mg Oral TID Nicholes Rough, NP       Followed by   Derrill Memo ON 02/15/2022] LORazepam (ATIVAN) tablet 1 mg  1 mg Oral BID Nicholes Rough, NP       Followed by   Derrill Memo ON 02/17/2022] LORazepam (ATIVAN) tablet 1 mg  1 mg Oral Daily Nkwenti, Doris, NP       magnesium hydroxide (MILK OF MAGNESIA) suspension 30 mL  30 mL Oral Daily PRN Starkes-Perry, Gayland Curry, FNP       multivitamin with minerals tablet 1 tablet  1 tablet Oral Daily Nicholes Rough, NP   1 tablet at 02/14/22 0805   OLANZapine zydis (ZYPREXA) disintegrating tablet 5 mg  5 mg Oral Q8H PRN Nicholes Rough, NP       And   ziprasidone (GEODON) injection 20 mg  20 mg Intramuscular PRN Nicholes Rough, NP       ondansetron  (ZOFRAN-ODT) disintegrating tablet 4 mg  4 mg Oral Q8H PRN Starkes-Perry, Gayland Curry, FNP       thiamine tablet 100 mg  100 mg Oral Daily Suella Broad, FNP   100 mg at 02/14/22 0805   traZODone (DESYREL) tablet 50 mg  50 mg Oral QHS PRN Margorie John W, PA-C   50 mg at 02/13/22 2142   PTA Medications: Medications Prior to Admission  Medication Sig Dispense Refill Last Dose   chlordiazePOXIDE (LIBRIUM) 25 MG capsule $RemoveBe'50mg'UElodeMxe$  PO TID x 1D, then 25-$RemoveBef'50mg'knSMYsdrqD$  PO BID X 1D, then 25-$RemoveBef'50mg'mpbIDQgnDP$  PO QD X 1D (Patient not taking: Reported on 02/13/2022) 10 capsule 0    chlorproMAZINE (THORAZINE) 25 MG tablet Take 1 tablet (25 mg total) by mouth 3 (three) times daily as needed for hiccoughs. (Patient not taking: Reported on 02/13/2022) 15 tablet 0    escitalopram (LEXAPRO) 20 MG tablet Take 20 mg by mouth daily.      ondansetron (ZOFRAN-ODT) 4 MG disintegrating tablet Take 1 tablet (4 mg total) by mouth every 8 (eight) hours as needed for nausea or vomiting. 20 tablet 0    traZODone (DESYREL) 50 MG tablet Take 50  mg by mouth at bedtime as needed for sleep.       Patient Stressors: Financial difficulties   Marital or family conflict   Substance abuse    Patient Strengths: Average or above average intelligence  Communication skills  Physical Health   Treatment Modalities: Medication Management, Group therapy, Case management,  1 to 1 session with clinician, Psychoeducation, Recreational therapy.   Physician Treatment Plan for Primary Diagnosis: MDD (major depressive disorder), recurrent episode, severe ([place]) Long Term Goal(s):     Short Term Goals:    Medication Management: Evaluate patient's response, side effects, and tolerance of medication regimen.  Therapeutic Interventions: 1 to 1 sessions, Unit Group sessions and Medication administration.  Evaluation of Outcomes: Not Met  Physician Treatment Plan for Secondary Diagnosis: Principal Problem:   MDD (major depressive disorder), recurrent episode,  severe ([place])  Long Term Goal(s):     Short Term Goals:       Medication Management: Evaluate patient's response, side effects, and tolerance of medication regimen.  Therapeutic Interventions: 1 to 1 sessions, Unit Group sessions and Medication administration.  Evaluation of Outcomes: Not Met   RN Treatment Plan for Primary Diagnosis: MDD (major depressive disorder), recurrent episode, severe ([place]) Long Term Goal(s): Knowledge of disease and therapeutic regimen to maintain health will improve  Short Term Goals: Ability to remain free from injury will improve, Ability to participate in decision making will improve, Ability to verbalize feelings will improve, Ability to disclose and discuss suicidal ideas, and Ability to identify and develop effective coping behaviors will improve  Medication Management: RN will administer medications as ordered by provider, will assess and evaluate patient's response and provide education to patient for prescribed medication. RN will report any adverse and/or side effects to prescribing provider.  Therapeutic Interventions: 1 on 1 counseling sessions, Psychoeducation, Medication administration, Evaluate responses to treatment, Monitor vital signs and CBGs as ordered, Perform/monitor CIWA, COWS, AIMS and Fall Risk screenings as ordered, Perform wound care treatments as ordered.  Evaluation of Outcomes: Not Met   LCSW Treatment Plan for Primary Diagnosis: MDD (major depressive disorder), recurrent episode, severe ([place]) Long Term Goal(s): Safe transition to appropriate next level of care at discharge, Engage patient in therapeutic group addressing interpersonal concerns.  Short Term Goals: Engage patient in aftercare planning with referrals and resources, Increase social support, Increase emotional regulation, Facilitate acceptance of mental health diagnosis and concerns, Identify triggers associated with mental health/substance abuse issues, and Increase  skills for wellness and recovery  Therapeutic Interventions: Assess for all discharge needs, 1 to 1 time with Social worker, Explore available resources and support systems, Assess for adequacy in community support network, Educate family and significant other(s) on suicide prevention, Complete Psychosocial Assessment, Interpersonal group therapy.  Evaluation of Outcomes: Not Met   Progress in Treatment: Attending groups: Yes. Participating in groups: Yes. Taking medication as prescribed: Yes. Toleration medication: Yes. Family/Significant other contact made: Yes, individual(s) contacted:  Girlfriend  Patient understands diagnosis: Yes. Discussing patient identified problems/goals with staff: Yes. Medical problems stabilized or resolved: Yes. Denies suicidal/homicidal ideation: Yes. Issues/concerns per patient self-inventory: No.   New problem(s) identified: No, Describe:  None   New Short Term/Long Term Goal(s): medication stabilization, elimination of SI thoughts, development of comprehensive mental wellness plan.   Patient Goals: "To get into a treatment center for my alcohol use"   Discharge Plan or Barriers: Patient recently admitted. CSW will continue to follow and assess for appropriate referrals and possible discharge planning.   Reason for  Continuation of Hospitalization: Depression Withdrawal symptoms  Estimated Length of Stay: 3 to 5 days    Scribe for Treatment Team: Darleen Crocker, Latanya Presser 02/14/2022 1:50 PM
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
## 2022-02-15 NOTE — Plan of Care (Signed)
Nurse discussed coping skills and anxiety with patient.  

## 2022-02-15 NOTE — Progress Notes (Signed)
Patient stated he does not need 1200 ativan at this time.  Stated he feels fine.  Patient feels he needs to be discharged asap.  Feels he has completed ativan taper.

## 2022-02-15 NOTE — Progress Notes (Signed)
[facility] MD Progress Note  02/15/2022 3:32 PM Keith Miller  MRN:  [id number] Subjective:    Patient is a 51 year old male with a psychiatric history of major depressive disorder and alcohol use disorder, who was admitted to the psychiatric unit for evaluation and treatment of worsening depression, and suicidal thoughts with intent and plan.  We are also treating alcohol withdrawal.  On my exam today, the patient reports that his mood is less depressed.  He reports less anhedonia.  He reports anxiety is off and on.  He reports his sleep is much improved.  He reports appetite is stable.  He denies feeling like he is withdrawing from alcohol, but he is observed to have a tremor on exam.  Patient is known to minimize symptoms, per collateral information obtained yesterday. At this time, the patient denies having suicidal thoughts.  When asked about the discrepancy between his denying having suicidal thoughts leading up to admission when asked yesterday, and the information obtained from his sister and girlfriend yesterday, the patient does admit to having suicidal thoughts prior to admission.  The patient continues to minimize the severity of the suicidal thoughts that he was having prior to admission.  It does appear that the suicidal thoughts occur when he is intoxicated, and in the context of stressors secondary to severe alcohol use disorder.  Denies HI.  Denies AH, VH, tactile hallucinations, paranoia, and other psychotic symptoms.  Denies having side effects to current psychiatric medications.  Patient agreeable to have a meeting with girlfriend, Child psychotherapist, and myself at 4 PM today.  Principal Problem: MDD (major depressive disorder), recurrent episode, severe (HCC) Diagnosis: Principal Problem:   MDD (major depressive disorder), recurrent episode, severe (HCC) Active Problems:   Alcohol withdrawal (HCC)   Suicidal ideation   Alcohol use disorder, severe, dependence (HCC)  Total Time spent  with patient: 20 minutes  Past Psychiatric History: Diagnosis: Major depressive disorder, alcohol use disorder Patient denies history of psychiatric hospitalization Patient reports 2017 he was admitted to [facility] for inpatient alcohol detox, then transition to outpatient care. Reports that PCP currently prescribes patient psychiatric medication, which is Lexapro 20 mg once daily Denies history of suicide attempt.  Past Medical History:  Past Medical History:  Diagnosis Date   ETOH abuse     Past Surgical History:  Procedure Laterality Date   ANKLE SURGERY     KNEE SURGERY     Family History: History reviewed. No pertinent family history. Family Psychiatric  History: See H&P Social History:  Social History   Substance and Sexual Activity  Alcohol Use Yes   Comment: 3-4 half pints of vodka QD     Social History   Substance and Sexual Activity  Drug Use Not Currently    Social History   Socioeconomic History   Marital status: Single    Spouse name: Not on file   Number of children: Not on file   Years of education: Not on file   Highest education level: Not on file  Occupational History   Not on file  Tobacco Use   Smoking status: Never   Smokeless tobacco: Never  Vaping Use   Vaping Use: Never used  Substance and Sexual Activity   Alcohol use: Yes    Comment: 3-4 half pints of vodka QD   Drug use: Not Currently   Sexual activity: Yes    Birth control/protection: None  Other Topics Concern   Not on file  Social History Narrative  Not on file   Social Determinants of Health   Financial Resource Strain: Not on file  Food Insecurity: Not on file  Transportation Needs: Not on file  Physical Activity: Not on file  Stress: Not on file  Social Connections: Not on file   Additional Social History:                         Sleep: Good  Appetite:  Good  Current Medications: Current Facility-Administered Medications  Medication Dose  Route Frequency Provider Last Rate Last Admin   acetaminophen (TYLENOL) tablet 650 mg  650 mg Oral Q6H PRN Starkes-Perry, Juel Burrowakia S, FNP       alum & mag hydroxide-simeth (MAALOX/MYLANTA) 200-200-20 MG/5ML suspension 30 mL  30 mL Oral Q4H PRN Starkes-Perry, Juel Burrowakia S, FNP       chlorproMAZINE (THORAZINE) tablet 25 mg  25 mg Oral TID PRN Maryagnes AmosStarkes-Perry, Takia S, FNP       escitalopram (LEXAPRO) tablet 20 mg  20 mg Oral Daily Maryagnes AmosStarkes-Perry, Takia S, FNP   20 mg at 02/15/22 0815   feeding supplement (ENSURE ENLIVE / ENSURE PLUS) liquid 237 mL  237 mL Oral BID BM Mason JimSingleton, [name] E, MD   237 mL at 02/15/22 1439   hydrOXYzine (ATARAX) tablet 25 mg  25 mg Oral Q6H PRN Starleen BlueNkwenti, Doris, NP   25 mg at 02/14/22 2147   lip balm (CARMEX) ointment   Topical Once Maryagnes AmosStarkes-Perry, Takia S, FNP       loperamide (IMODIUM) capsule 2-4 mg  2-4 mg Oral PRN Starleen BlueNkwenti, Doris, NP       LORazepam (ATIVAN) tablet 1 mg  1 mg Oral Q6H PRN Starleen BlueNkwenti, Doris, NP       LORazepam (ATIVAN) tablet 1 mg  1 mg Oral TID Starleen BlueNkwenti, Doris, NP   1 mg at 02/15/22 16100814   Followed by   LORazepam (ATIVAN) tablet 1 mg  1 mg Oral BID Starleen BlueNkwenti, Doris, NP       Followed by   Melene Muller[START ON 02/17/2022] LORazepam (ATIVAN) tablet 1 mg  1 mg Oral Daily Nkwenti, Doris, NP       magnesium hydroxide (MILK OF MAGNESIA) suspension 30 mL  30 mL Oral Daily PRN Starkes-Perry, Juel Burrowakia S, FNP       multivitamin with minerals tablet 1 tablet  1 tablet Oral Daily Starleen BlueNkwenti, Doris, NP   1 tablet at 02/15/22 0815   OLANZapine zydis (ZYPREXA) disintegrating tablet 5 mg  5 mg Oral Q8H PRN Starleen BlueNkwenti, Doris, NP       And   ziprasidone (GEODON) injection 20 mg  20 mg Intramuscular PRN Nkwenti, Doris, NP       ondansetron (ZOFRAN-ODT) disintegrating tablet 4 mg  4 mg Oral Q8H PRN Starkes-Perry, Juel Burrowakia S, FNP       thiamine tablet 100 mg  100 mg Oral Daily Maryagnes AmosStarkes-Perry, Takia S, FNP   100 mg at 02/15/22 0815   traZODone (DESYREL) tablet 50 mg  50 mg Oral QHS PRN Jaclyn Shaggyaylor, Cody W, PA-C   50 mg at  02/14/22 2147    Lab Results:  Results for orders placed or performed during the hospital encounter of 02/13/22 (from the past 48 hour(s))  Urine rapid drug screen (hosp performed)     Status: Abnormal   Collection Time: 02/13/22  7:00 PM  Result Value Ref Range   Opiates NONE DETECTED NONE DETECTED   Cocaine NONE DETECTED NONE DETECTED   Benzodiazepines POSITIVE (A) NONE DETECTED  Amphetamines NONE DETECTED NONE DETECTED   Tetrahydrocannabinol NONE DETECTED NONE DETECTED   Barbiturates NONE DETECTED NONE DETECTED    Comment: (NOTE) DRUG SCREEN FOR MEDICAL PURPOSES ONLY.  IF CONFIRMATION IS NEEDED FOR ANY PURPOSE, NOTIFY LAB WITHIN 5 DAYS.  LOWEST DETECTABLE LIMITS FOR URINE DRUG SCREEN Drug Class                     Cutoff (ng/mL) Amphetamine and metabolites    1000 Barbiturate and metabolites    200 Benzodiazepine                 200 Tricyclics and metabolites     300 Opiates and metabolites        300 Cocaine and metabolites        300 THC                            50 Performed at [facility], [address], [place], [place] [address]   TSH     Status: None   Collection Time: 02/14/22  6:52 AM  Result Value Ref Range   TSH 1.295 0.350 - 4.500 uIU/mL    Comment: Performed by a 3rd Generation assay with a functional sensitivity of <=0.01 uIU/mL. Performed at [facility], [address], [place], [place] [address]   Hemoglobin A1c     Status: Abnormal   Collection Time: 02/14/22  6:52 AM  Result Value Ref Range   Hgb A1c MFr Bld 4.6 (L) 4.8 - 5.6 %    Comment: (NOTE) Pre diabetes:          5.7%-6.4%  Diabetes:              >6.4%  Glycemic control for   <7.0% adults with diabetes    Mean Plasma Glucose 85.32 mg/dL    Comment: Performed at [facility], [address], [place], [place] [address]  Lipid panel     Status: None   Collection Time: 02/14/22  6:52 AM  Result Value Ref Range   Cholesterol 142 0 - 200  mg/dL   Triglycerides 284 <132 mg/dL   HDL 61 >44 mg/dL   Total CHOL/HDL Ratio 2.3 RATIO   VLDL 23 0 - 40 mg/dL   LDL Cholesterol 58 0 - 99 mg/dL    Comment:        Total Cholesterol/HDL:CHD Risk Coronary Heart Disease Risk Table                     Men   Women  1/2 Average Risk   3.4   3.3  Average Risk       5.0   4.4  2 X Average Risk   9.6   7.1  3 X Average Risk  23.4   11.0        Use the calculated Patient Ratio above and the CHD Risk Table to determine the patient's CHD Risk.        ATP III CLASSIFICATION (LDL):  <100     mg/dL   Optimal  010-272  mg/dL   Near or Above                    Optimal  130-159  mg/dL   Borderline  536-644  mg/dL   High  >034     mg/dL   Very High Performed at [facility], [address] [place], [place] [id number]     Blood Alcohol level:  Lab Results  Component Value Date   ETH 235 (H) 02/12/2022   ETH 283 (H) 02/10/2022    Metabolic Disorder Labs: Lab Results  Component Value Date   HGBA1C 4.6 (L) 02/14/2022   MPG 85.32 02/14/2022   No results found for: PROLACTIN Lab Results  Component Value Date   CHOL 142 02/14/2022   TRIG 114 02/14/2022   HDL 61 02/14/2022   CHOLHDL 2.3 02/14/2022   VLDL 23 02/14/2022   LDLCALC 58 02/14/2022    Physical Findings: AIMS: Facial and Oral Movements Muscles of Facial Expression: None, normal Lips and Perioral Area: None, normal Jaw: None, normal Tongue: None, normal,Extremity Movements Upper (arms, wrists, hands, fingers): None, normal Lower (legs, knees, ankles, toes): None, normal, Trunk Movements Neck, shoulders, hips: None, normal, Overall Severity Severity of abnormal movements (highest score from questions above): None, normal Incapacitation due to abnormal movements: None, normal Patient's awareness of abnormal movements (rate only patient's report): No Awareness, Dental Status Current problems with teeth and/or dentures?: No Does patient usually wear  dentures?: No  CIWA:  CIWA-Ar Total: 1 COWS:     Musculoskeletal: Strength & Muscle Tone: within normal limits Gait & Station: normal Patient leans: N/A  Psychiatric Specialty Exam:  Presentation  General Appearance: Casual  Eye Contact:Good  Speech:Normal Rate  Speech Volume:Normal  Handedness:Right   Mood and Affect  Mood:Dysphoric  Affect:Congruent; Restricted   Thought Process  Thought Processes:Linear  Descriptions of Associations:Intact  Orientation:Full (Time, Place and Person)  Thought Content:Logical  History of Schizophrenia/Schizoaffective disorder:No  Duration of Psychotic Symptoms:No data recorded Hallucinations:Hallucinations: None  Ideas of Reference:None  Suicidal Thoughts:Suicidal Thoughts: No  Homicidal Thoughts:Homicidal Thoughts: No   Sensorium  Memory:Immediate Good; Recent Good; Remote Good  Judgment:Poor  Insight:Poor   Executive Functions  Concentration:Good  Attention Span:Good  Recall:Fair  Fund of Knowledge:Fair  Language:Good   Psychomotor Activity  Psychomotor Activity:Psychomotor Activity: Tremor   Assets  Assets:Desire for Improvement; Intimacy; Social Support   Sleep  Sleep:Sleep: Good    Physical Exam: Physical Exam Vitals reviewed.  Constitutional:      General: He is not in acute distress.    Appearance: He is normal weight. He is not toxic-appearing.  Pulmonary:     Effort: Pulmonary effort is normal.  Neurological:     Mental Status: He is alert.     Motor: No weakness.     Gait: Gait normal.   Review of Systems  Constitutional:  Negative for chills and fever.  Cardiovascular:  Negative for chest pain and palpitations.  Psychiatric/Behavioral:  Positive for depression and substance abuse. Negative for suicidal ideas. The patient is nervous/anxious. The patient does not have insomnia.    Blood pressure 125/82, pulse 82, temperature 98 F (36.7 C), resp. rate 17, height 6' (1.829  m), weight 93.4 kg, SpO2 98 %. Body mass index is 27.94 kg/m.   Treatment Plan Summary:  Daily contact with patient to assess and evaluate symptoms and progress in treatment, Medication management, and Plan     Assessment: Major depressive disorder, severe, recurrent, without psychotic features Alcohol use disorder, severe     PLAN: Safety and Monitoring:             -- Involuntary admission to inpatient psychiatric unit for safety, stabilization and treatment             -- Daily contact with patient to assess and evaluate  symptoms and progress in treatment             -- Patient's case to be discussed in multi-disciplinary team meeting             -- Observation Level : q15 minute checks             -- Vital signs:  q12 hours             -- Precautions: suicide, elopement, and assault   2. Psychiatric Diagnoses and Treatment:               -Continue lexapro 20 mg once daily - pt was not adherent with this medication prior to admission, taking less than every other day. -We discussed starting naltrexone.  Will continue discussion and offer initiating treatment with this medication tomorrow.  -Continue alcohol withdrawal treatment with scheduled Ativan and as needed Ativan per CIWA score, as ordered  --  The risks/benefits/side-effects/alternatives to this medication were discussed in detail with the patient and time was given for questions. The patient consents to medication trial.                -- Metabolic profile and EKG monitoring obtained while on an atypical antipsychotic (BMI: Lipid Panel: HbgA1c: QTc:) 432             -- Encouraged patient to participate in unit milieu and in scheduled group therapies              -- Short Term Goals: Ability to identify changes in lifestyle to reduce recurrence of condition will improve, Ability to verbalize feelings will improve, Ability to disclose and discuss suicidal ideas, Ability to demonstrate self-control will improve, Ability to  identify and develop effective coping behaviors will improve, Ability to maintain clinical measurements within normal limits will improve, Compliance with prescribed medications will improve, and Ability to identify triggers associated with substance abuse/mental health issues will improve             -- Long Term Goals: Improvement in symptoms so as ready for discharge                3. Medical Issues Being Addressed:              Alcohol use disorder, treatment as above   4. Discharge Planning:              -- Social work and case management to assist with discharge planning and identification of hospital follow-up needs prior to discharge             -- Estimated LOS: 5-7 days             -- Discharge Concerns: Need to establish a safety plan; Medication compliance and effectiveness             -- Discharge Goals: Return home with outpatient referrals for mental health follow-up including medication management/psychotherapy      Cristy Hilts, MD 02/15/2022, 3:32 PM  Total Time Spent in Direct Patient Care:  I personally spent 30 minutes on the unit in direct patient care. The direct patient care time included face-to-face time with the patient, reviewing the patient's chart, communicating with other professionals, and coordinating care. Greater than 50% of this time was spent in counseling or coordinating care with the patient regarding goals of hospitalization, psycho-education, and discharge planning needs.   Phineas Inches, MD Psychiatrist

## 2022-02-15 NOTE — Progress Notes (Signed)
Adult Psychoeducational Group Note  Date:  02/15/2022 Time:  8:46 PM  Group Topic/Focus:  Wrap-Up Group:   The focus of this group is to help patients review their daily goal of treatment and discuss progress on daily workbooks.  Participation Level:  Active  Participation Quality:  Appropriate  Affect:  Appropriate  Cognitive:  Appropriate  Insight: Appropriate  Engagement in Group:  Engaged  Modes of Intervention:  Discussion  Additional Comments:  Pt attended group and actively participated.  [name] D 02/15/2022, 8:46 PM

## 2022-02-15 NOTE — Progress Notes (Signed)
°   02/15/22 2117  Psych Admission Type (Psych Patients Only)  Admission Status Involuntary  Psychosocial Assessment  Patient Complaints Depression  Eye Contact Fair  Facial Expression Anxious  Affect Appropriate to circumstance  Speech Logical/coherent  Interaction Minimal  Motor Activity Other (Comment) (WDL)  Appearance/Hygiene Unremarkable  Behavior Characteristics Cooperative;Appropriate to situation  Mood Anxious;Depressed  Thought Process  Coherency WDL  Content WDL  Delusions None reported or observed  Perception WDL  Hallucination None reported or observed  Judgment Poor  Confusion None  Danger to Self  Current suicidal ideation? Denies  Danger to Others  Danger to Others None reported or observed

## 2022-02-15 NOTE — Progress Notes (Signed)
D:  Patient's self inventory sheet, patient sleeps good, sleep medication helpful.  Good appetite, high energy level, good concentration.  Rated depression, hopeless and anxiety 1.  Denied withdrawals.  Denied SI.  Denied physical problems.  Denied physical pain.  Goal is making plans for release.  Plans to stay positive and speak to MD/SW.  Does have discharge plans. A:  Medications administered per MD orders.  Emotional support and encouragement given patient. R:  Denied SI and HI, contracts for safety.  Denied A/V hallucinations.  Safety maintained with 15 minute checks.

## 2022-02-16 DIAGNOSIS — F332 Major depressive disorder, recurrent severe without psychotic features: Principal | ICD-10-CM

## 2022-02-16 MED ORDER — HYDROXYZINE HCL 25 MG PO TABS
25.0000 mg | ORAL_TABLET | Freq: Once | ORAL | Status: AC
  Administered 2022-02-16: 25 mg via ORAL
  Filled 2022-02-16 (×2): qty 1

## 2022-02-16 MED ORDER — TRAZODONE HCL 50 MG PO TABS
50.0000 mg | ORAL_TABLET | Freq: Every evening | ORAL | 0 refills | Status: DC | PRN
Start: 2022-02-16 — End: 2022-05-25

## 2022-02-16 MED ORDER — NALTREXONE HCL 50 MG PO TABS: 50.0000 mg | ORAL_TABLET | Freq: Every day | ORAL | 0 refills | Status: DC

## 2022-02-16 MED ORDER — NALTREXONE HCL 50 MG PO TABS
50.0000 mg | ORAL_TABLET | Freq: Every day | ORAL | Status: DC
  Administered 2022-02-16 – 2022-02-17 (×2): 50 mg via ORAL
  Filled 2022-02-16 (×3): qty 1
  Filled 2022-02-16: qty 7

## 2022-02-16 MED ORDER — ESCITALOPRAM OXALATE 20 MG PO TABS: 20.0000 mg | ORAL_TABLET | Freq: Every day | ORAL | 0 refills | Status: DC

## 2022-02-16 NOTE — [facility] Counselor (Signed)
CSW received email from [facility] stating that pt has been denied to their residential program and IOP program at this time.   Fredirick Lathe, LCSWA Clinicial Social Worker [employer]

## 2022-02-16 NOTE — [facility] Suicide Risk Assessment (Signed)
[facility] INPATIENT:  Family/Significant Other Suicide Prevention Education  Suicide Prevention Education:  Education Completed;  [name] Markham Jordan (girlfriend) [phone],  (name of family member/significant other) has been identified by the patient as the family member/significant other with whom the patient will be residing, and identified as the person(s) who will aid the patient in the event of a mental health crisis (suicidal ideations/suicide attempt).  With written consent from the patient, the family member/significant other has been provided the following suicide prevention education, prior to the and/or following the discharge of the patient.  The suicide prevention education provided includes the following: Suicide risk factors Suicide prevention and interventions National Suicide Hotline telephone number [facility] assessment telephone number [facility] Emergency Assistance 911 [facility] and/or Residential Mobile Crisis Unit telephone number  Request made of family/significant other to: Remove weapons (e.g., guns, rifles, knives), all items previously/currently identified as safety concern.   Remove drugs/medications (over-the-counter, prescriptions, illicit drugs), all items previously/currently identified as a safety concern.  The family member/significant other verbalizes understanding of the suicide prevention education information provided.  The family member/significant other agrees to remove the items of safety concern listed above.  Safety concern: "that he will get home and get overwhelmed by everything and be back where he was.". No access to weapons. Knives have been removed from home.   Felizardo Hoffmann 02/16/2022, 4:23 PM

## 2022-02-16 NOTE — Progress Notes (Signed)
[facility] MD Progress Note  02/16/2022 2:36 PM Keith Miller  MRN:  [id number] Subjective:    Patient is a 51 year old male with a psychiatric history of major depressive disorder and alcohol use disorder, who was admitted to the psychiatric unit for evaluation and treatment of worsening depression, and suicidal thoughts with intent and plan.  We are also treating alcohol withdrawal.  On my exam today, the patient reports that his mood has improved and denies feeling depressed down or sad.  Denies anhedonia.  Reports sleep is better.  Reports appetite is better.  Reports anxiety is less.  Denies alcohol withdrawal symptoms.  Reports her concentration is better.  Reports he was able to participate in group on triggers.  Denies having suicidal thoughts, which was explored carefully.  Denies having homicidal thoughts. Denies AH, VH, tactile hallucinations, paranoia, and other psychotic symptoms.  Patient is agreeable to starting naltrexone for alcohol use disorder, first dose today.  We extensively discussed disposition planning.  Patient is psychiatrically improved and if he continues to have psychiatric stability over the next 24 hours, discharge would be appropriate.  Patient girlfriend cannot agree on inpatient rehab versus IOP. As discharge planning is set for tomorrow, if alcohol withdrawal symptoms remain controlled and patient does not have psychiatric decompensation, he will be discharged home.  Patient has agreed to start substance use IOP treatment at [facility], starting Monday morning at 9:30 AM.  Patient is agreeable to this.  Denies having side effects to current psychiatric medications.  Yesterday at 4pm there was a family meeting with pt, girlfriend, Child psychotherapist, and this Clinical research associate.  The patient and girlfriend did not agree on whether the patient to be discharged to IOP or inpatient rehab.  Did not agree on timeline nor did they agree on bed to bed transfer versus patient  wanting to go home first to prepare before starting a substance use program.  At the conclusion of the meeting, there is no consensus on disposition for substance use treatment.  Principal Problem: MDD (major depressive disorder), recurrent episode, severe (HCC) Diagnosis: Principal Problem:   MDD (major depressive disorder), recurrent episode, severe (HCC) Active Problems:   Alcohol withdrawal (HCC)   Suicidal ideation   Alcohol use disorder, severe, dependence (HCC)  Total Time spent with patient: 20 minutes  Past Psychiatric History: Diagnosis: Major depressive disorder, alcohol use disorder Patient denies history of psychiatric hospitalization Patient reports 2017 he was admitted to [facility] for inpatient alcohol detox, then transition to outpatient care. Reports that PCP currently prescribes patient psychiatric medication, which is Lexapro 20 mg once daily Denies history of suicide attempt.  Past Medical History:  Past Medical History:  Diagnosis Date   ETOH abuse     Past Surgical History:  Procedure Laterality Date   ANKLE SURGERY     KNEE SURGERY     Family History: History reviewed. No pertinent family history. Family Psychiatric  History: See H&P Social History:  Social History   Substance and Sexual Activity  Alcohol Use Yes   Comment: 3-4 half pints of vodka QD     Social History   Substance and Sexual Activity  Drug Use Not Currently    Social History   Socioeconomic History   Marital status: Single    Spouse name: Not on file   Number of children: Not on file   Years of education: Not on file   Highest education level: Not on file  Occupational History   Not on file  Tobacco Use   Smoking status: Never   Smokeless tobacco: Never  Vaping Use   Vaping Use: Never used  Substance and Sexual Activity   Alcohol use: Yes    Comment: 3-4 half pints of vodka QD   Drug use: Not Currently   Sexual activity: Yes    Birth control/protection: None   Other Topics Concern   Not on file  Social History Narrative   Not on file   Social Determinants of Health   Financial Resource Strain: Not on file  Food Insecurity: Not on file  Transportation Needs: Not on file  Physical Activity: Not on file  Stress: Not on file  Social Connections: Not on file   Additional Social History:                         Sleep: Good  Appetite:  Good  Current Medications: Current Facility-Administered Medications  Medication Dose Route Frequency Provider Last Rate Last Admin   acetaminophen (TYLENOL) tablet 650 mg  650 mg Oral Q6H PRN Starkes-Perry, Juel Burrowakia S, FNP       alum & mag hydroxide-simeth (MAALOX/MYLANTA) 200-200-20 MG/5ML suspension 30 mL  30 mL Oral Q4H PRN Starkes-Perry, Juel Burrowakia S, FNP       chlorproMAZINE (THORAZINE) tablet 25 mg  25 mg Oral TID PRN Maryagnes AmosStarkes-Perry, Takia S, FNP       escitalopram (LEXAPRO) tablet 20 mg  20 mg Oral Daily Maryagnes AmosStarkes-Perry, Takia S, FNP   20 mg at 02/16/22 0749   feeding supplement (ENSURE ENLIVE / ENSURE PLUS) liquid 237 mL  237 mL Oral BID BM Mason JimSingleton, [name] E, MD   237 mL at 02/16/22 1417   hydrOXYzine (ATARAX) tablet 25 mg  25 mg Oral Q6H PRN Starleen BlueNkwenti, Doris, NP   25 mg at 02/15/22 2117   lip balm (CARMEX) ointment   Topical Once Maryagnes AmosStarkes-Perry, Takia S, FNP       loperamide (IMODIUM) capsule 2-4 mg  2-4 mg Oral PRN Starleen BlueNkwenti, Doris, NP       LORazepam (ATIVAN) tablet 1 mg  1 mg Oral Q6H PRN Starleen BlueNkwenti, Doris, NP       [START ON 02/17/2022] LORazepam (ATIVAN) tablet 1 mg  1 mg Oral Daily Nkwenti, Doris, NP       magnesium hydroxide (MILK OF MAGNESIA) suspension 30 mL  30 mL Oral Daily PRN Starkes-Perry, Juel Burrowakia S, FNP       multivitamin with minerals tablet 1 tablet  1 tablet Oral Daily Starleen BlueNkwenti, Doris, NP   1 tablet at 02/16/22 0749   naltrexone (DEPADE) tablet 50 mg  50 mg Oral Daily Digna Countess, MD       OLANZapine zydis (ZYPREXA) disintegrating tablet 5 mg  5 mg Oral Q8H PRN Nkwenti, Doris, NP        And   ziprasidone (GEODON) injection 20 mg  20 mg Intramuscular PRN Nkwenti, Doris, NP       ondansetron (ZOFRAN-ODT) disintegrating tablet 4 mg  4 mg Oral Q8H PRN Starkes-Perry, Juel Burrowakia S, FNP       thiamine tablet 100 mg  100 mg Oral Daily Maryagnes AmosStarkes-Perry, Takia S, FNP   100 mg at 02/16/22 0749   traZODone (DESYREL) tablet 50 mg  50 mg Oral QHS PRN Jaclyn Shaggyaylor, Cody W, PA-C   50 mg at 02/15/22 2117    Lab Results:  No results found for this or any previous visit (from the past 48 hour(s)).   Blood Alcohol level:  Lab Results  Component Value Date   ETH 235 (H) 02/12/2022   ETH 283 (H) 02/10/2022    Metabolic Disorder Labs: Lab Results  Component Value Date   HGBA1C 4.6 (L) 02/14/2022   MPG 85.32 02/14/2022   No results found for: PROLACTIN Lab Results  Component Value Date   CHOL 142 02/14/2022   TRIG 114 02/14/2022   HDL 61 02/14/2022   CHOLHDL 2.3 02/14/2022   VLDL 23 02/14/2022   LDLCALC 58 02/14/2022    Physical Findings: AIMS: Facial and Oral Movements Muscles of Facial Expression: None, normal Lips and Perioral Area: None, normal Jaw: None, normal Tongue: None, normal,Extremity Movements Upper (arms, wrists, hands, fingers): None, normal Lower (legs, knees, ankles, toes): None, normal, Trunk Movements Neck, shoulders, hips: None, normal, Overall Severity Severity of abnormal movements (highest score from questions above): None, normal Incapacitation due to abnormal movements: None, normal Patient's awareness of abnormal movements (rate only patient's report): No Awareness, Dental Status Current problems with teeth and/or dentures?: No Does patient usually wear dentures?: No  CIWA:  CIWA-Ar Total: 0 COWS:     Musculoskeletal: Strength & Muscle Tone: within normal limits Gait & Station: normal Patient leans: N/A  Psychiatric Specialty Exam:  Presentation  General Appearance: Casual  Eye Contact:Good  Speech:Normal Rate  Speech  Volume:Normal  Handedness:Right   Mood and Affect  Mood: "Good"  Affect:Congruent; full range   Thought Process  Thought Processes:Linear  Descriptions of Associations:Intact  Orientation:Full (Time, Place and Person)  Thought Content:Logical  History of Schizophrenia/Schizoaffective disorder:No  Duration of Psychotic Symptoms:No data recorded Hallucinations:Hallucinations: None  Ideas of Reference:None  Suicidal Thoughts:Suicidal Thoughts: No  Homicidal Thoughts:Homicidal Thoughts: No   Sensorium  Memory:Immediate Good; Recent Good; Remote Good  Judgment: Fair  Insight: Fair   Art therapist  Concentration:Good  Attention Span:Good  Recall:Fair  Fund of Knowledge:Fair  Language:Good   Psychomotor Activity  Psychomotor Activity:Psychomotor Activity: Tremor   Assets  Assets:Desire for Improvement; Intimacy; Social Support   Sleep  Sleep:Sleep: Good    Physical Exam: Physical Exam Vitals reviewed.  Constitutional:      General: He is not in acute distress.    Appearance: He is normal weight. He is not toxic-appearing.  Pulmonary:     Effort: Pulmonary effort is normal.  Neurological:     Mental Status: He is alert.     Motor: No weakness.     Gait: Gait normal.   Review of Systems  Constitutional:  Negative for chills and fever.  Cardiovascular:  Negative for chest pain and palpitations.  Psychiatric/Behavioral:  Positive for substance abuse. Negative for depression and suicidal ideas. The patient is not nervous/anxious and does not have insomnia.    Blood pressure 135/86, pulse (!) 120, temperature 98.7 F (37.1 C), temperature source Oral, resp. rate 17, height 6' (1.829 m), weight 93.4 kg, SpO2 96 %. Body mass index is 27.94 kg/m.   Treatment Plan Summary:  Daily contact with patient to assess and evaluate symptoms and progress in treatment, Medication management, and Plan     Assessment: Major depressive disorder,  severe, recurrent, without psychotic features Alcohol use disorder, severe     PLAN: Safety and Monitoring:             -- Involuntary admission to inpatient psychiatric unit for safety, stabilization and treatment             -- Daily contact with patient to assess and evaluate symptoms and progress in treatment             --  Patient's case to be discussed in multi-disciplinary team meeting             -- Observation Level : q15 minute checks             -- Vital signs:  q12 hours             -- Precautions: suicide, elopement, and assault   2. Psychiatric Diagnoses and Treatment:               -Start naltrexone 50 mg once daily-for alcohol use disorder -Continue lexapro 20 mg once daily - pt was not adherent with this medication prior to admission, taking less than every other day. -Continue alcohol withdrawal treatment with scheduled Ativan and as needed Ativan per CIWA score, as ordered (last dose of Ativan tomorrow morning 02-17-2021)  --  The risks/benefits/side-effects/alternatives to this medication were discussed in detail with the patient and time was given for questions. The patient consents to medication trial.                -- Metabolic profile and EKG monitoring obtained while on an atypical antipsychotic (BMI: Lipid Panel: HbgA1c: QTc:) 432             -- Encouraged patient to participate in unit milieu and in scheduled group therapies              -- Short Term Goals: Ability to identify changes in lifestyle to reduce recurrence of condition will improve, Ability to verbalize feelings will improve, Ability to disclose and discuss suicidal ideas, Ability to demonstrate self-control will improve, Ability to identify and develop effective coping behaviors will improve, Ability to maintain clinical measurements within normal limits will improve, Compliance with prescribed medications will improve, and Ability to identify triggers associated with substance abuse/mental health  issues will improve             -- Long Term Goals: Improvement in symptoms so as ready for discharge                3. Medical Issues Being Addressed:              Alcohol use disorder, treatment as above   4. Discharge Planning:              -- Social work and case management to assist with discharge planning and identification of hospital follow-up needs prior to discharge             -- Estimated LOS: 5-7 days             -- Discharge Concerns: Need to establish a safety plan; Medication compliance and effectiveness             -- Discharge Goals: Return home with outpatient referrals for mental health follow-up including medication management/psychotherapy      Cristy Hilts, MD 02/16/2022, 2:36 PM  Total Time Spent in Direct Patient Care:  I personally spent 30 minutes on the unit in direct patient care. The direct patient care time included face-to-face time with the patient, reviewing the patient's chart, communicating with other professionals, and coordinating care. Greater than 50% of this time was spent in counseling or coordinating care with the patient regarding goals of hospitalization, psycho-education, and discharge planning needs.   Phineas Inches, MD Psychiatrist

## 2022-02-16 NOTE — Group Note (Signed)
Recreation Therapy Group Note   Group Topic:Stress Management  Group Date: 02/16/2022 Start Time: 0930 End Time: 0950 Facilitators: Caroll Rancher, [place] Location: [address] Dayroom   Goal Area(s) Addresses:  Patient will identify positive stress management techniques. Patient will identify benefits of using stress management post d/c.   Group Description:  Meditation.  LRT played a meditation that focused on letting go of the past.  Patients were listen to the meditation while focusing on their breathing and allowing the breathing to relax them as much as possible.  After the meditation, LRT explained to patients about accessing stress management tools through Apps, Youtube, yoga, etc.    Affect/Mood: Appropriate   Participation Level: Active   Participation Quality: Independent   Behavior: Appropriate   Speech/Thought Process: Focused   Insight: Good   Judgement: Good   Modes of Intervention: Meditation   Patient Response to Interventions:  Attentive   Education Outcome:  Acknowledges education and In group clarification offered    Clinical Observations/Individualized Feedback:  Pt attended and participated in group activity.     Plan: Continue to engage patient in RT group sessions 2-3x/week.   Caroll Rancher, LRT,CTRS 02/16/2022 11:16 AM

## 2022-02-16 NOTE — Progress Notes (Signed)
Adult Psychoeducational Group Note  Date:  02/16/2022 Time:  1:28 PM  Group Topic/Focus:  Goals Group:   The focus of this group is to help patients establish daily goals to achieve during treatment and discuss how the patient can incorporate goal setting into their daily lives to aide in recovery.  Participation Level:  Active  Participation Quality:  Appropriate  Affect:  Appropriate  Cognitive:  Appropriate  Insight: Appropriate  Engagement in Group:  Engaged  Modes of Intervention:  Discussion  Additional Comments: Patient attended morning orientation group and said that his goal for today is to discuss his discharge plan with his physician.  Arienne Gartin W Zenobia Kuennen [phone], 1:28 PM

## 2022-02-16 NOTE — Group Note (Signed)
° °  Date/Time: 02/16/2022 @ 1:00pm  Type of Therapy and Topic:  Group Therapy:  Recognizing Triggers  Participation Level:  Active   Description of Group:   Recognizing Triggers: Patients defined triggers and discussed the importance of recognizing their personal warning signs. Patients identified their own triggers and how they tend to cope with stressful situations. Patients discussed areas such as people, places, things, and thoughts that rigger certain emotions for them. CSW provided support to patients and discussed safety planning for when these triggers occur. Group participants had opportunities to share openly with the group and participate in a group discussion while providing support and feedback to their peers.  Therapeutic Goals: Patient will identify triggers that are contributing to a problem in their life Patient will identify unwanted behaviors and feelings associated with a trigger.  Patient will share with other group members strategies to confront and avoid triggers so that they may be able to react appropriately to triggers in daily life.    Summary of Patient Progress: Keith Miller participated appropriately in groups. Patient participated in introductions and identified his trigger of alcohol use including people places and things.  Patient discussed not going over to certain friends residences and going to a different location where alcohol won't be served as a way to face his trigger.      Therapeutic Modalities:   Cognitive Behavioral Therapy Solution Focused Therapy Motivational Interviewing Family Systems Approach   Jeremyah Jelley IXL, LCSW, LCAS Clincal Social Worker  [facility]

## 2022-02-16 NOTE — Progress Notes (Signed)
Patient did attend the evening speaker AA meeting.  

## 2022-02-16 NOTE — Progress Notes (Signed)
Pt denies SI/HI/AVH.  Pt's hands are shaky when he holds the medicine cup or a piece of paper.  Pt says he's ready for d/c today.  RN administered medications per provider orders and assessed for need and concerns.  Pt is coping well at this time.

## 2022-02-16 NOTE — Discharge Summary (Addendum)
Physician Discharge Summary Note  Patient:  Keith Miller is an 51 y.o., male MRN:  [id number] DOB:  1971-01-06 Patient phone:  [phone] (home)  Patient address:   [address] [place] [id number],  Total Time spent with patient: 20 minutes  Date of Admission:  02/13/2022 Date of Discharge: 02-17-22  Reason for Admission:    Patient is a 51 year old male with a psychiatric history of major depressive disorder and alcohol use disorder, who was admitted to the psychiatric unit for evaluation and treatment of worsening depression, and suicidal thoughts with intent and plan.  We are also treating alcohol withdrawal.  Principal Problem: MDD (major depressive disorder), recurrent episode, severe (HCC) Discharge Diagnoses: Principal Problem:   MDD (major depressive disorder), recurrent episode, severe (HCC) Active Problems:   Alcohol withdrawal (HCC)   Suicidal ideation   Alcohol use disorder, severe, dependence (HCC)   Past Psychiatric History:  Patient is a 51 year old male with a psychiatric history of major depressive disorder and alcohol use disorder, who was admitted to the psychiatric unit for evaluation and treatment of worsening depression, and suicidal thoughts with intent and plan.  We are also treating alcohol withdrawal.  Past Medical History:  Past Medical History:  Diagnosis Date   ETOH abuse     Past Surgical History:  Procedure Laterality Date   ANKLE SURGERY     KNEE SURGERY     Family History: History reviewed. No pertinent family history. Family Psychiatric  History:   Mother diagnosed with major depressive disorder.  Denies family history of suicide attempt.  Social History:  Social History   Substance and Sexual Activity  Alcohol Use Yes   Comment: 3-4 half pints of vodka QD     Social History   Substance and Sexual Activity  Drug Use Not Currently    Social History   Socioeconomic History   Marital status: Single    Spouse name:  Not on file   Number of children: Not on file   Years of education: Not on file   Highest education level: Not on file  Occupational History   Not on file  Tobacco Use   Smoking status: Never   Smokeless tobacco: Never  Vaping Use   Vaping Use: Never used  Substance and Sexual Activity   Alcohol use: Yes    Comment: 3-4 half pints of vodka QD   Drug use: Not Currently   Sexual activity: Yes    Birth control/protection: None  Other Topics Concern   Not on file  Social History Narrative   Not on file   Social Determinants of Health   Financial Resource Strain: Not on file  Food Insecurity: Not on file  Transportation Needs: Not on file  Physical Activity: Not on file  Stress: Not on file  Social Connections: Not on file    Hospital Course:    During the patient's hospitalization, patient had extensive initial psychiatric evaluation, and follow-up psychiatric evaluations every day.   Psychiatric diagnoses provided upon initial assessment:  Major depressive disorder, severe, recurrent, without psychotic features Alcohol use disorder, severe   Patient's psychiatric medications were adjusted on admission:  -Re-start lexapro 20 mg once daily - pt was not adherent with this medication prior to admission, taking less than every other day. -We discussed starting naltrexone.  -Continue alcohol withdrawal treatment with scheduled Ativan and as needed Ativan per CIWA score, as ordered   During the hospitalization, other adjustments were made to the patient's psychiatric medication  regimen:  -lexapro was continued at 20 mg once daily  -naltrexone 50 mg once daily was started for alcohol use disorder  -scheduled ativan for treating alcohol withdrawal, was tapered to stop    Gradually, patient started adjusting to milieu.   Patient's care was discussed during the interdisciplinary team meeting every day during the hospitalization.   The patient denied having side effects to  prescribed psychiatric medication.   The patient reports their target psychiatric symptoms of depression, alcohol withdrawal, and suicidal thoughts, all responded well to the psychiatric medications, and the patient reports overall benefit other psychiatric hospitalization. Supportive psychotherapy was provided to the patient. The patient also participated in regular group therapy while admitted.    Labs were reviewed with the patient, and abnormal results were discussed with the patient.   The patient denied having suicidal thoughts more than 48 hours prior to discharge.  Patient denies having homicidal thoughts.  Patient denies having auditory hallucinations.  Patient denies any visual hallucinations.  Patient denies having paranoid thoughts.   The patient is able to verbalize their individual safety plan to this provider.   It is recommended to the patient to continue psychiatric medications as prescribed, after discharge from the hospital.     It is recommended to the patient to follow up with your outpatient psychiatric provider and PCP.   Discussed with the patient, the impact of alcohol, drugs, tobacco have been there overall psychiatric and medical wellbeing, and total abstinence from substance use was recommended the patient.   Physical Findings: AIMS: Facial and Oral Movements Muscles of Facial Expression: None, normal Lips and Perioral Area: None, normal Jaw: None, normal Tongue: None, normal,Extremity Movements Upper (arms, wrists, hands, fingers): None, normal Lower (legs, knees, ankles, toes): None, normal, Trunk Movements Neck, shoulders, hips: None, normal, Overall Severity Severity of abnormal movements (highest score from questions above): None, normal Incapacitation due to abnormal movements: None, normal Patient's awareness of abnormal movements (rate only patient's report): No Awareness, Dental Status Current problems with teeth and/or dentures?: No Does patient  usually wear dentures?: No  CIWA:  CIWA-Ar Total: 4 COWS:     Musculoskeletal: Strength & Muscle Tone: within normal limits Gait & Station: normal Patient leans: N/A   Psychiatric Specialty Exam:  Presentation  General Appearance: Casual; Fairly Groomed; Appropriate for Environment  Eye Contact:Good  Speech:Normal Rate  Speech Volume:Normal  Handedness:Right   Mood and Affect  Mood:Euthymic  Affect:Congruent; Appropriate; Full Range   Thought Process  Thought Processes:Linear  Descriptions of Associations:Intact  Orientation:Full (Time, Place and Person)  Thought Content:Logical  History of Schizophrenia/Schizoaffective disorder:No  Duration of Psychotic Symptoms:No data recorded Hallucinations:Hallucinations: None  Ideas of Reference:None  Suicidal Thoughts:Suicidal Thoughts: No  Homicidal Thoughts:Homicidal Thoughts: No   Sensorium  Memory:Immediate Good; Recent Good; Remote Good  Judgment:Good  Insight:Good   Executive Functions  Concentration:Fair  Attention Span:Fair  Recall:Good  Fund of Knowledge:Good  Language:Good   Psychomotor Activity  Psychomotor Activity:Psychomotor Activity: Normal   Assets  Assets:Desire for Improvement; Intimacy; Social Support   Sleep  Sleep:Sleep: Good    Physical Exam: Physical Exam Vitals reviewed.  Constitutional:      General: He is not in acute distress.    Appearance: He is normal weight. He is not ill-appearing or toxic-appearing.  Pulmonary:     Effort: Pulmonary effort is normal.  Neurological:     Mental Status: He is alert.     Motor: No weakness.     Gait: Gait normal.  Psychiatric:  Mood and Affect: Mood normal.        Behavior: Behavior normal.   Review of Systems  Constitutional:  Negative for chills and fever.  Cardiovascular:  Negative for chest pain and palpitations.  Neurological:  Negative for dizziness, tingling, tremors and headaches.   Psychiatric/Behavioral:  Positive for substance abuse. Negative for depression, hallucinations and suicidal ideas. The patient is not nervous/anxious and does not have insomnia.   All other systems reviewed and are negative. Blood pressure 116/83, pulse (!) 111, temperature 99.1 F (37.3 C), temperature source Oral, resp. rate 17, height 6' (1.829 m), weight 93.4 kg, SpO2 97 %. Body mass index is 27.94 kg/m.   Social History   Tobacco Use  Smoking Status Never  Smokeless Tobacco Never   Tobacco Cessation:  N/A, patient does not currently use tobacco products   Blood Alcohol level:  Lab Results  Component Value Date   ETH 235 (H) 02/12/2022   ETH 283 (H) 02/10/2022    Metabolic Disorder Labs:  Lab Results  Component Value Date   HGBA1C 4.6 (L) 02/14/2022   MPG 85.32 02/14/2022   No results found for: PROLACTIN Lab Results  Component Value Date   CHOL 142 02/14/2022   TRIG 114 02/14/2022   HDL 61 02/14/2022   CHOLHDL 2.3 02/14/2022   VLDL 23 02/14/2022   LDLCALC 58 02/14/2022    See Psychiatric Specialty Exam and Suicide Risk Assessment completed by Attending Physician prior to discharge.  Discharge destination:  Home  Is patient on multiple antipsychotic therapies at discharge:  No   Has Patient had three or more failed trials of antipsychotic monotherapy by history:  No  Recommended Plan for Multiple Antipsychotic Therapies: NA   Allergies as of 02/16/2022   No Known Allergies      Medication List     STOP taking these medications    chlordiazePOXIDE 25 MG capsule Commonly known as: LIBRIUM   chlorproMAZINE 25 MG tablet Commonly known as: THORAZINE   ondansetron 4 MG disintegrating tablet Commonly known as: ZOFRAN-ODT       TAKE these medications      Indication  escitalopram 20 MG tablet Commonly known as: LEXAPRO Take 1 tablet (20 mg total) by mouth daily.  Indication: Major Depressive Disorder   naltrexone 50 MG tablet Commonly  known as: DEPADE Take 1 tablet (50 mg total) by mouth daily.  Indication: Abuse or Misuse of Alcohol   traZODone 50 MG tablet Commonly known as: DESYREL Take 1 tablet (50 mg total) by mouth at bedtime as needed for sleep.  Indication: Trouble Sleeping, Major Depressive Disorder        Follow-up Information     BEHAVIORAL HEALTH INTENSIVE CHEMICAL DEPENDENCY. Go on 02/19/2022.   Specialty: Behavioral Health Why: You have an assesssment appointment for substance abuse intensive outpatient therapy services on 02/19/22 at 9:30 am. ( Medication management will also be available).  This appointment will be held in person, and is for assessment only.  It will last approximately 2 to 2.5 hours.   If you have any questions, please call Myrna Blazer at [phone]. Contact information: [address] [id number] mc [place] [address] [phone]                Follow-up recommendations:    Activity: as tolerated   Diet: heart healthy   Other: -Follow-up with your outpatient psychiatric provider -instructions on appointment date, time, and address (location) are provided to you in discharge paperwork.   -  Take your psychiatric medications as prescribed at discharge - instructions are provided to you in the discharge paperwork   -Follow-up with outpatient primary care doctor and other specialists -for management of chronic medical disease.    -Testing: Follow-up with outpatient provider for abnormal lab results: none   -Recommend abstinence from alcohol, tobacco, and other illicit drug use at discharge.    -If your psychiatric symptoms recur, worsen, or if you have side effects to your psychiatric medications, call your outpatient psychiatric provider, 911, [phone] or go to the nearest emergency department.   -If suicidal thoughts recur, call your outpatient psychiatric provider, 911, [phone] or go to the nearest emergency department.  Signed: Cristy Hilts, MD 02/16/2022, 5:46 PM    Total Time Spent in Direct Patient Care:  I personally spent 45 minutes on the unit in direct patient care on 02-16-2022. The direct patient care time included face-to-face time with the patient, reviewing the patient's chart, communicating with other professionals, and coordinating care. Greater than 50% of this time was spent in counseling or coordinating care with the patient regarding goals of hospitalization, psycho-education, and discharge planning needs.   Phineas Inches, MD Psychiatrist

## 2022-02-16 NOTE — Plan of Care (Signed)
°  Problem: Education: °Goal: Emotional status will improve °Outcome: Progressing °Goal: Mental status will improve °Outcome: Progressing °Goal: Verbalization of understanding the information provided will improve °Outcome: Progressing °  °

## 2022-02-16 NOTE — BHH Suicide Risk Assessment (Signed)
[facility] Discharge Suicide Risk Assessment   Principal Problem: MDD (major depressive disorder), recurrent episode, severe ([place]) Discharge Diagnoses: Principal Problem:   MDD (major depressive disorder), recurrent episode, severe ([place]) Active Problems:   Alcohol withdrawal ([place])   Suicidal ideation   Alcohol use disorder, severe, dependence ([place])   Total Time spent with patient: 20 minutes  Patient is a 51 year old male with a psychiatric history of major depressive disorder and alcohol use disorder, who was admitted to the psychiatric unit for evaluation and treatment of worsening depression, and suicidal thoughts with intent and plan.  We are also treating alcohol withdrawal.  During the patient's hospitalization, patient had extensive initial psychiatric evaluation, and follow-up psychiatric evaluations every day.  Psychiatric diagnoses provided upon initial assessment:  Major depressive disorder, severe, recurrent, without psychotic features Alcohol use disorder, severe  Patient's psychiatric medications were adjusted on admission:  -Re-start lexapro 20 mg once daily - pt was not adherent with this medication prior to admission, taking less than every other day. -We discussed starting naltrexone.  -Continue alcohol withdrawal treatment with scheduled Ativan and as needed Ativan per CIWA score, as ordered  During the hospitalization, other adjustments were made to the patient's psychiatric medication regimen:  -lexapro was continued at 20 mg once daily  -naltrexone 50 mg once daily was started for alcohol use disorder  -Ativan was tapered to stop   Gradually, patient started adjusting to milieu.   Patient's care was discussed during the interdisciplinary team meeting every day during the hospitalization.  The patient denied having side effects to prescribed psychiatric medication.  The patient reports their target psychiatric symptoms of depression, alcohol withdrawal, and suicidal  thoughts, all responded well to the psychiatric medications, and the patient reports overall benefit other psychiatric hospitalization. Supportive psychotherapy was provided to the patient. The patient also participated in regular group therapy while admitted.   Labs were reviewed with the patient, and abnormal results were discussed with the patient.  The patient denied having suicidal thoughts more than 48 hours prior to discharge.  Patient denies having homicidal thoughts.  Patient denies having auditory hallucinations.  Patient denies any visual hallucinations.  Patient denies having paranoid thoughts.  The patient is able to verbalize their individual safety plan to this provider.  It is recommended to the patient to continue psychiatric medications as prescribed, after discharge from the hospital.    It is recommended to the patient to follow up with your outpatient psychiatric provider and PCP.  Discussed with the patient, the impact of alcohol, drugs, tobacco have been there overall psychiatric and medical wellbeing, and total abstinence from substance use was recommended the patient.    Musculoskeletal: Strength & Muscle Tone: within normal limits Gait & Station: normal Patient leans: N/A  Psychiatric Specialty Exam  Presentation  General Appearance: Casual; Fairly Groomed; Appropriate for Environment  Eye Contact:Good  Speech:Normal Rate  Speech Volume:Normal  Handedness:Right   Mood and Affect  Mood:Euthymic  Duration of Depression Symptoms: Greater than two weeks  Affect:Congruent; Appropriate; Full Range   Thought Process  Thought Processes:Linear  Descriptions of Associations:Intact  Orientation:Full (Time, Place and Person)  Thought Content:Logical  History of Schizophrenia/Schizoaffective disorder:No  Duration of Psychotic Symptoms:No data recorded Hallucinations:Hallucinations: None  Ideas of Reference:None  Suicidal Thoughts:Suicidal  Thoughts: No  Homicidal Thoughts:Homicidal Thoughts: No   Sensorium  Memory:Immediate Good; Recent Good; Remote Good  Judgment:Good  Insight:Good   Executive Functions  Concentration:Fair  Attention Span:Fair  [place] of Knowledge:Good  Language:Good  Psychomotor Activity  Psychomotor Activity:Psychomotor Activity: Normal   Assets  Assets:Desire for Improvement; Intimacy; Social Support   Sleep  Sleep:Sleep: Good   Physical Exam: Physical Exam See discharge summary  ROS See discharge summary  Blood pressure 135/86, pulse (!) 120, temperature 98.7 F (37.1 C), temperature source Oral, resp. rate 17, height 6' (1.829 m), weight 93.4 kg, SpO2 96 %. Body mass index is 27.94 kg/m.  Mental Status Per Nursing Assessment::   On Admission:  NA  Demographic factors:  Male, Caucasian, Access to firearms Loss Factors:  Financial problems / change in socioeconomic status Historical Factors:  Impulsivity Risk Reduction Factors:  Positive social support  Continued Clinical Symptoms:  MDD - mood is stable. Denies SI. Denies HI.  Alcohol use disorder - alcohol withdrawal symptoms are reported to have resolved.  Cognitive Features That Contribute To Risk:  None    Suicide Risk:  Mild: There are no identifiable suicidal plans, no associated intent, mild dysphoria and related symptoms, good self-control (both objective and subjective assessment), few other risk factors, and identifiable protective factors, including available and accessible social support.   Follow-up Information     BEHAVIORAL HEALTH INTENSIVE CHEMICAL DEPENDENCY. Go on 02/19/2022.   Specialty: Behavioral Health Why: You have an assesssment appointment for substance abuse intensive outpatient therapy services on 02/19/22 at 9:30 am. ( Medication management will also be available).  This appointment will be held in person, and is for assessment only.  It will last approximately 2 to 2.5 hours.    If you have any questions, please call Adam Phenix at [phone]. Contact information: [place] [id number] [place] [phone]                Plan Of Care/Follow-up recommendations:   Activity: as tolerated  Diet: heart healthy  Other: -Follow-up with your outpatient psychiatric provider -instructions on appointment date, time, and address (location) are provided to you in discharge paperwork.  -Take your psychiatric medications as prescribed at discharge - instructions are provided to you in the discharge paperwork  -Follow-up with outpatient primary care doctor and other specialists -for management of chronic medical disease.   -Testing: Follow-up with outpatient provider for abnormal lab results: none  -Recommend abstinence from alcohol, tobacco, and other illicit drug use at discharge.   -If your psychiatric symptoms recur, worsen, or if you have side effects to your psychiatric medications, call your outpatient psychiatric provider, 911, [phone] or go to the nearest emergency department.  -If suicidal thoughts recur, call your outpatient psychiatric provider, 911, [phone] or go to the nearest emergency department.   Christoper Allegra, MD 02/16/2022, 3:48 PM

## 2022-02-16 NOTE — Progress Notes (Addendum)
°  On assessment, pt presents with moderate anxiety. Pt reports his plan is to begin IOP on Monday.  Pt is hopeful and grateful for the support of family and friends.  Pt denies SI/HI and verbally contracts for safety.  Pt denies AVH.  Administered PRN Trazodone and one-time dose of Hydroxyzine per MAR per pt request for sleep and anxiety.  Pt is scheduled to discharge.  Pt remains safe on unit with Q 15 minute safety checks.    02/16/22 2128  Psych Admission Type (Psych Patients Only)  Admission Status Involuntary  Psychosocial Assessment  Patient Complaints Anxiety  Eye Contact Fair  Facial Expression Anxious  Affect Appropriate to circumstance  Speech Logical/coherent  Interaction Minimal  Motor Activity Other (Comment) (WDL)  Appearance/Hygiene Unremarkable  Behavior Characteristics Cooperative;Appropriate to situation  Mood Pleasant;Anxious  Thought Process  Coherency WDL  Content WDL  Delusions None reported or observed  Perception WDL  Hallucination None reported or observed  Judgment Poor  Confusion None  Danger to Self  Current suicidal ideation? Denies  Danger to Others  Danger to Others None reported or observed

## 2022-02-17 NOTE — Progress Notes (Signed)
D. Pt was friendly upon approach-reported that he slept well last night and that he was ready for discharge today. Pt rated his depression, hopelessness and anxiety all 1's on his self inventory. Pt currently denies SI/HI and AVH  A. Labs and vitals monitored. Pt given and educated on medications. Pt supported emotionally and encouraged to express concerns and ask questions.   R. Pt remains safe with 15 minute checks. Will continue POC.

## 2022-02-17 NOTE — Group Note (Signed)
LCSW Group Therapy Note  02/17/2022     Type of Therapy and Topic:  Group Therapy: Anger and Coping Skills  Participation Level:  Active   Description of Group:   In this group, patients learned how to recognize the physical, cognitive, emotional, and behavioral responses they have to anger-provoking situations.  They identified how they usually or often react when angered, and learned how healthy and unhealthy coping skills work initially, but the unhealthy ones stop working.   They analyzed how their frequently-chosen coping skill is possibly beneficial and how it is possibly unhelpful.  The group discussed a variety of healthier coping skills that could help in resolving the actual issues, as well as how to go about planning for the the possibility of future similar situations.  Therapeutic Goals: Patients will identify one thing that makes them angry and how they feel emotionally and physically, what their thoughts are or tend to be in those situations, and what healthy or unhealthy coping mechanism they typically use Patients will identify how their coping technique works for them, as well as how it works against them. Patients will explore possible new behaviors to use in future anger situations. Patients will learn that anger itself is normal and cannot be eliminated, and that healthier coping skills can assist with resolving conflict rather than worsening situations.  Summary of Patient Progress:  The patient shared that he often is angered by intolerance and lack of respect and chooses to cope with these feelings by trying to see the other person's point of view or just removing himself from the situation.  The patient was discharged during group so was not present the whole time.  Therapeutic Modalities:   Cognitive Behavioral Therapy   Lynnell Chad  .

## 2022-02-17 NOTE — Progress Notes (Signed)
[facility] MD Progress Note  02/17/2022 6:58 AM ARAGORN MENDEZ  MRN:  [id number]  Reason for Admission: Patient is a 51 year old male with a psychiatric history of major depressive disorder and alcohol use disorder, who was admitted to the psychiatric unit for evaluation and treatment of worsening depression, and suicidal thoughts with intent and plan.    Chart Review from last 24 hours:  The patient's chart was reviewed and nursing notes were reviewed. The patient's case was discussed in multidisciplinary team meeting. Per nursing patient had no acute behavioral or safety concerns noted.  Per MAR compliant with scheduled medications and did not require any as needed meds other than trazodone x1 for sleep.  Information Obtained Today During Patient Interview: The patient was seen and evaluated on the unit. On assessment today the patient reports his mood is "good" and he denies SI or HI.  He voices no physical complaints and denies signs of cravings or withdrawal at this time.  He reports a good appetite and stable sleep.  He denies AVH and voices no physical complaints.  He denies paranoia or delusions.  He is due to start IOP on Monday.  I advised that in review of his labs he did have a mildly low potassium earlier in his admission, but he states that he has been drinking Ensure and working on increasing his p.o. intake and dietary consumption during admission.  He does not wish to stay to have any repeat labs and instead agrees to see his primary care provider for outpatient follow-up of his potassium after discharge.  We discussed the electrolyte changes that can occur with alcohol use and the fact that electrolyte changes can contribute to fatal heart arrhythmias.  He verbalized understanding of risks.  He was encouraged to eat bananas drink orange juice and see his primary care provider next week without fail for repeat electrolytes.  Time was given for questions.  Principal Problem: MDD (major  depressive disorder), recurrent episode, severe (HCC) Diagnosis: Principal Problem:   MDD (major depressive disorder), recurrent episode, severe (HCC) Active Problems:   Alcohol withdrawal (HCC)   Suicidal ideation   Alcohol use disorder, severe, dependence (HCC)  Total Time Spent in Direct Patient Care:  I personally spent 25 minutes on the unit in direct patient care. The direct patient care time included face-to-face time with the patient, reviewing the patient's chart, communicating with other professionals, and coordinating care. Greater than 50% of this time was spent in counseling or coordinating care with the patient regarding goals of hospitalization, psycho-education, and discharge planning needs.   Past Psychiatric History: Diagnosis: Major depressive disorder, alcohol use disorder Patient denies history of psychiatric hospitalization Patient reports 2017 he was admitted to [facility] for inpatient alcohol detox, then transition to outpatient care. Reports that PCP currently prescribes patient psychiatric medication, which is Lexapro 20 mg once daily Denies history of suicide attempt.  Past Medical History:  Past Medical History:  Diagnosis Date   ETOH abuse     Past Surgical History:  Procedure Laterality Date   ANKLE SURGERY     KNEE SURGERY     Family History: see H&P  Family Psychiatric  History: See H&P  Social History:  Social History   Substance and Sexual Activity  Alcohol Use Yes   Comment: 3-4 half pints of vodka QD     Social History   Substance and Sexual Activity  Drug Use Not Currently    Social History   Socioeconomic History  Marital status: Single    Spouse name: Not on file   Number of children: Not on file   Years of education: Not on file   Highest education level: Not on file  Occupational History   Not on file  Tobacco Use   Smoking status: Never   Smokeless tobacco: Never  Vaping Use   Vaping Use: Never used  Substance  and Sexual Activity   Alcohol use: Yes    Comment: 3-4 half pints of vodka QD   Drug use: Not Currently   Sexual activity: Yes    Birth control/protection: None  Other Topics Concern   Not on file  Social History Narrative   Not on file   Social Determinants of Health   Financial Resource Strain: Not on file  Food Insecurity: Not on file  Transportation Needs: Not on file  Physical Activity: Not on file  Stress: Not on file  Social Connections: Not on file     Sleep: Good  Appetite:  Good  Current Medications: Current Facility-Administered Medications  Medication Dose Route Frequency Provider Last Rate Last Admin   acetaminophen (TYLENOL) tablet 650 mg  650 mg Oral Q6H PRN Starkes-Perry, Gayland Curry, FNP       alum & mag hydroxide-simeth (MAALOX/MYLANTA) 200-200-20 MG/5ML suspension 30 mL  30 mL Oral Q4H PRN Starkes-Perry, Gayland Curry, FNP       chlorproMAZINE (THORAZINE) tablet 25 mg  25 mg Oral TID PRN Suella Broad, FNP       escitalopram (LEXAPRO) tablet 20 mg  20 mg Oral Daily Suella Broad, FNP   20 mg at 02/16/22 0749   feeding supplement (ENSURE ENLIVE / ENSURE PLUS) liquid 237 mL  237 mL Oral BID BM Nelda Marseille, Fishel Wamble E, MD   237 mL at 02/16/22 1417   lip balm (CARMEX) ointment   Topical Once Starkes-Perry, Gayland Curry, FNP       LORazepam (ATIVAN) tablet 1 mg  1 mg Oral Daily Nkwenti, Doris, NP       magnesium hydroxide (MILK OF MAGNESIA) suspension 30 mL  30 mL Oral Daily PRN Starkes-Perry, Gayland Curry, FNP       multivitamin with minerals tablet 1 tablet  1 tablet Oral Daily Nicholes Rough, NP   1 tablet at 02/16/22 0749   naltrexone (DEPADE) tablet 50 mg  50 mg Oral Daily Massengill, Ovid Curd, MD   50 mg at 02/16/22 1653   OLANZapine zydis (ZYPREXA) disintegrating tablet 5 mg  5 mg Oral Q8H PRN Nicholes Rough, NP       And   ziprasidone (GEODON) injection 20 mg  20 mg Intramuscular PRN Nkwenti, Doris, NP       ondansetron (ZOFRAN-ODT) disintegrating tablet 4 mg  4  mg Oral Q8H PRN Starkes-Perry, Gayland Curry, FNP       thiamine tablet 100 mg  100 mg Oral Daily Suella Broad, FNP   100 mg at 02/16/22 0749   traZODone (DESYREL) tablet 50 mg  50 mg Oral QHS PRN Prescilla Sours, PA-C   50 mg at 02/16/22 2128    Lab Results:  No results found for this or any previous visit (from the past 48 hour(s)).   Blood Alcohol level:  Lab Results  Component Value Date   ETH 235 (H) 02/12/2022   ETH 283 (H) [id number]    Metabolic Disorder Labs: Lab Results  Component Value Date   HGBA1C 4.6 (L) 02/14/2022   MPG 85.32 02/14/2022   No results  found for: PROLACTIN Lab Results  Component Value Date   CHOL 142 02/14/2022   TRIG 114 02/14/2022   HDL 61 02/14/2022   CHOLHDL 2.3 02/14/2022   VLDL 23 02/14/2022   LDLCALC 58 02/14/2022    Physical Findings: AIMS: Facial and Oral Movements Muscles of Facial Expression: None, normal Lips and Perioral Area: None, normal Jaw: None, normal Tongue: None, normal,Extremity Movements Upper (arms, wrists, hands, fingers): None, normal Lower (legs, knees, ankles, toes): None, normal, Trunk Movements Neck, shoulders, hips: None, normal, Overall Severity Severity of abnormal movements (highest score from questions above): None, normal Incapacitation due to abnormal movements: None, normal Patient's awareness of abnormal movements (rate only patient's report): No Awareness, Dental Status Current problems with teeth and/or dentures?: No Does patient usually wear dentures?: No  CIWA:  CIWA-Ar Total: 2   Musculoskeletal: Strength & Muscle Tone: within normal limits Gait & Station: normal Patient leans: N/A  Psychiatric Specialty Exam:  Presentation  General Appearance: Casual; Fairly Groomed; Appropriate for Environment  Eye Contact:Good  Speech:Normal Rate  Speech Volume:Normal  Mood and Affect  Mood: "Good" - appear euthymic  Affect:Congruent; full range   Thought Process  Thought Processes:  Linear and goal directed  Descriptions of Associations:Intact  Orientation:Full (Time, Place and Person)  Thought Content:Logical - denies SI, HI, AVH or paranoia  Hallucinations:Hallucinations: None  Ideas of Reference:None  Suicidal Thoughts:Suicidal Thoughts: No  Homicidal Thoughts:Homicidal Thoughts: No   Sensorium  Memory:Immediate Good; Recent Good; Remote Good  Judgment: Fair  Insight: Fair   Community education officer  Concentration:Fair  Attention Span:Fair  [place] of Knowledge:Good  Language:Good   Psychomotor Activity  Psychomotor Activity:Psychomotor Activity: Normal   Assets  Assets:Desire for Improvement; Intimacy; Social Support  Physical Exam Vitals reviewed.  Constitutional:      General: He is not in acute distress.    Appearance: He is normal weight. He is not toxic-appearing.  Pulmonary:     Effort: Pulmonary effort is normal.  Neurological:     Mental Status: He is alert.     Motor: No weakness.     Gait: Gait normal.   Review of Systems  Respiratory:  Negative for shortness of breath.   Cardiovascular:  Negative for chest pain and palpitations.  Gastrointestinal:  Negative for diarrhea, nausea and vomiting.  Neurological:  Negative for dizziness.   Blood pressure (!) 119/91, pulse (!) 106, temperature 99.1 F (37.3 C), temperature source Oral, resp. rate 18, height 6' (1.829 m), weight 93.4 kg, SpO2 98 %. Body mass index is 27.94 kg/m.   Treatment Plan Summary:  Daily contact with patient to assess and evaluate symptoms and progress in treatment, Medication management, and Plan     Assessment: Major depressive disorder, severe, recurrent, without psychotic features Alcohol use disorder, severe     PLAN: Safety and Monitoring:             -- Involuntary admission to inpatient psychiatric unit for safety, stabilization and treatment             -- Daily contact with patient to assess and evaluate symptoms and  progress in treatment             -- Patient's case to be discussed in multi-disciplinary team meeting             -- Observation Level : q15 minute checks             -- Vital signs:  q12 hours             --  Precautions: suicide, elopement, and assault   2. Psychiatric Diagnoses and Treatment:            -Continue naltrexone 50 mg once daily-for alcohol use disorder -Continue lexapro 20 mg once daily  -Finishing last scheduled dose of Ativan today and due to start IOP on Monday   - Patient encouraged to have repeat K+ checked by PCP as an outpatient and declines additional labs today     3. Discharge Planning:              -- Discharge today      Harlow Asa, MD, FAPA 02/17/2022, 6:58 AM

## 2022-02-17 NOTE — Progress Notes (Signed)
Pt discharged to lobby. Pt was stable and appreciative at that time. All papers, samples, and prescriptions were given and valuables returned. Verbal understanding expressed. Denies SI/HI and A/VH. Pt given opportunity to express concerns and ask questions. 

## 2022-02-17 NOTE — Progress Notes (Signed)
°  [facility] Adult Case Management Discharge Plan :  Will you be returning to the same living situation after discharge:  Yes,  with girlfriend At discharge, do you have transportation home?: Yes,  girlfriend Do you have the ability to pay for your medications: No.  Is listed as having [employer], but not in effect per pharmacy  Release of information consent forms completed and emailed to Medical Records, then turned in to Medical Records by CSW.   Patient to Follow up at:  Follow-up Information     BEHAVIORAL HEALTH INTENSIVE CHEMICAL DEPENDENCY. Go on 02/19/2022.   Specialty: Behavioral Health Why: You have an assessment appointment for substance abuse intensive outpatient therapy services on 02/19/22 at 9:30 am. ( Medication management will also be available).  This appointment will be held in person, and is for assessment only.  It will last approximately 2 to 2.5 hours.   If you have any questions, please call Myrna Blazer at [phone]. Contact information: [address] [phone] mc [place] [address] [phone]                Next level of care provider has access to [facility] Link:yes  Safety Planning and Suicide Prevention discussed: Yes,  with girlfriend     Has patient been referred to the Quitline?: N/A patient is not a smoker  Patient has been referred for addiction treatment: Yes  Lynnell Chad, LCSW 02/17/2022, 9:31 AM

## 2022-02-19 ENCOUNTER — Ambulatory Visit (INDEPENDENT_AMBULATORY_CARE_PROVIDER_SITE_OTHER): Payer: Commercial Managed Care - PPO | Admitting: Licensed Clinical Social Worker

## 2022-02-19 ENCOUNTER — Ambulatory Visit (HOSPITAL_COMMUNITY): Payer: 59

## 2022-02-19 ENCOUNTER — Other Ambulatory Visit: Payer: Self-pay

## 2022-02-19 DIAGNOSIS — F1020 Alcohol dependence, uncomplicated: Secondary | ICD-10-CM | POA: Diagnosis not present

## 2022-02-19 NOTE — Plan of Care (Deleted)
Client agrees to the Care Plan   Problem: Yanky will abstain from using alcohol as evidenced by having no urine screens testing positive for alcohol.  Goal:  02/19/2022 Outcome: Not Applicable Goal: LTG: Improve quality of life by maintaining ongoing abstinence from all mood-altering substances: Input needed on appropriate metric Outcome: Not Applicable Goal: LTG: Eryn WILL INCREASE COPING SKILLS TO PROMOTE LONG-TERM RECOVERY AND IMPROVE ABILITY TO PERFORM DAILY ACTIVITIES Outcome: Not Applicable Goal: STG: Promote adherence to treatment regimen Outcome: Not Applicable

## 2022-02-19 NOTE — Plan of Care (Deleted)
°  Problem: Keith Miller will abstain from using alcohol as evidenced by having no urine screens testing positive for alcohol.  Goal:  02/19/2022 Outcome: Not Applicable Goal: LTG: Improve quality of life by maintaining ongoing abstinence from all mood-altering substances: Input needed on appropriate metric Outcome: Not Applicable Goal: LTG: Keith Miller WILL INCREASE COPING SKILLS TO PROMOTE LONG-TERM RECOVERY AND IMPROVE ABILITY TO PERFORM DAILY ACTIVITIES Outcome: Not Applicable Goal: STG: Promote adherence to treatment regimen Outcome: Not Applicable

## 2022-02-19 NOTE — Plan of Care (Deleted)
°  Problem: Keith Miller will abstain from using alcohol as evidenced by having no urine screens testing positive for alcohol.  °Goal:  02/19/2022 °Outcome: Not Applicable °Goal: LTG: Improve quality of life by maintaining ongoing abstinence from all mood-altering substances: Input needed on appropriate metric °Outcome: Not Applicable °Goal: LTG: [name] WILL INCREASE COPING SKILLS TO PROMOTE LONG-TERM RECOVERY AND IMPROVE ABILITY TO PERFORM DAILY ACTIVITIES °Outcome: Not Applicable °Goal: STG: Promote adherence to treatment regimen °Outcome: Not Applicable °  °

## 2022-02-19 NOTE — Progress Notes (Signed)
Comprehensive Clinical Assessment (CCA) Note  02/19/2022 Keith Miller [id number]  Chief Complaint:  Chief Complaint  Patient presents with   Addiction Problem    Client indicates that his primary reason for presenting today is a problem with "alcohol."    Visit Diagnosis: 303.90 Alcohol Use Disorder, Severe   CCA Screening, Triage and Referral (STR)  Patient Reported Information How did you hear about [place]? Hospital Discharge  Referral name: [facility]  Whom do you see for routine medical problems? Primary Care  Practice/Facility Name: Dr. Shon Baton  What Is the Reason for Your Visit/Call Today? Keith Miller is a 51 y/o male who presents today as he would like to start the SA IOP. He was recently discharged from [facility] as he was admitted involuntarily due to ETOH abuse and suicidal ideation; however, he denies ever having had thoughts of suicide. Per his PHQ-9 and GAD-7, he is currently experiencing mild depression and anxiety. He denies drug use; however, he has a hx of alcohol abuse. He started drinking sometime in [employer]. His average amount of use for the past 2-3 months are binges. He describes his current use as very sporadic. Last use was yesterday at which time he had two mixed drinks. He reports drinking 2 pints of Vodka. He has received treatment at [facility] in 2017. Longest period of sobriety is 4 yrs. He denies a family hx of substance use other than possibly a maternal grandmother.     How Long Has This Been Causing You Problems? > than 6 months  What Do You Feel Would Help You the Most Today? Alcohol or Drug Use Treatment; Treatment for Depression or other mood problem   Have You Recently Been in Any Inpatient Treatment (Hospital/Detox/Crisis Center/28-Day Program)? Yes  Name/Location of Program/Hospital:[facility] How Long Were You There? Approximately one week When Were You Discharged? 02/16/2022 Have You Ever  Received Services From [employer] Before? Yes  Have You Recently Had Any Thoughts About Hurting Yourself? No  Are You Planning to Commit Suicide/Harm Yourself At This time? No   Have you Recently Had Thoughts About [place]? No  Have You Used Any Alcohol or Drugs in the Past 24 Hours? Yes  How Long Ago Did You Use Drugs or Alcohol? Yesterday What Did You Use and How Much? 2 vodka tonics   Do You Currently Have a Therapist/Psychiatrist? No  Name of Therapist/Psychiatrist: Denies hx of inpatient psychiatric treatment. He does not have a therapist and/or psychiatrist. However, looking for outpatient psychiatric providers due to his recent DWI.   Have You Been Recently Discharged From Any Office Practice or Programs? No    CCA Screening Triage Referral Assessment Type of Contact: Face-to-Face  Is this Initial or Reassessment? Initial Assessment  Date Telepsych consult ordered in [facility]:  02/13/22  Time [facility]psych consult ordered [facility]:  1132   Patient Reported Information Reviewed? Counselor reviewed [employer] notes Patient Left Without Being Seen? No data recorded Reason for Not Completing Assessment: CCA for IOP  Collateral Involvement: No collateral involvement   Does Patient Have a Court Appointed Legal Guardian? No Is CPS involved or ever been involved? Never  Is APS involved or ever been involved? Never   Patient Determined To Be At Risk for Harm To Self or Others Based on Review of Patient Reported Information or Presenting Complaint? No  Do You Have any Outstanding Charges, Pending Court Dates, Parole/Probation? DWI charge earlier this month Location of Assessment: Outpatient  Office   Does Patient Present under Involuntary Commitment? No   [place] of Residence: [place]   Patient Currently Receiving the Following Services: -- (Patient has no psychiatric services in place at this time.)   Determination of Need: Urgent (48  hours)   Options For Referral: Chemical Dependency Intensive Outpatient Therapy (CDIOP)     CCA Biopsychosocial Intake/Chief Complaint:  alcohol  Current Symptoms/Problems: The client says that he is having anxiety, depression, and lack of sleep. He says that he drinks to try and elminate those symptoms. He says that it only makes it worse but that he continues to do it.   Patient Reported Schizophrenia/Schizoaffective Diagnosis in Past: No   Strengths: pt actively seeking help for alcohol use  Preferences: SA IOP  Abilities: Daeshaun likes to exercise and was able to obtain four years of sobriety previously with AA attendance.   Type of Services Patient Feels are Needed: SA IOP  Initial Clinical Notes/Concerns: Recent alcohol use post-discharge.   Mental Health Symptoms Depression:   Change in energy/activity; Fatigue; Hopelessness; Worthlessness; Increase/decrease in appetite; Sleep (too much or little); Weight gain/loss   Duration of Depressive symptoms:  Greater than two weeks   Mania:   Racing thoughts   Anxiety:    Worrying; Sleep; Irritability; Fatigue   Psychosis:   None   Duration of Psychotic symptoms: No data recorded  Trauma:   None   Obsessions:   None   Compulsions:   None   Inattention:   None   Hyperactivity/Impulsivity:   None   Oppositional/Defiant Behaviors:   None   Emotional Irregularity:   None   Other Mood/Personality Symptoms:  No data recorded   Mental Status Exam Appearance and self-care  Stature:   Average   Weight:   Thin   Clothing:   Neat/clean   Grooming:   Normal   Cosmetic use:   None   Posture/gait:   Normal   Motor activity:   Not Remarkable   Sensorium  Attention:   Normal   Concentration:   Normal   Orientation:   X5   Recall/memory:   Normal   Affect and Mood  Affect:   Appropriate   Mood:   Depressed; Anxious   Relating  Eye contact:   Normal   Facial expression:   --  (affect is appropriate)   Attitude toward examiner:   Cooperative   Thought and Language  Speech flow:  Clear and Coherent   Thought content:   Appropriate to Mood and Circumstances   Preoccupation:   Guilt   Hallucinations:   None   Organization:  No data recorded  [employer] of Knowledge:   Good   Intelligence:   Average   Abstraction:   Normal   Judgement:   Fair   Reality Testing:   Variable   Insight:   Gaps   Decision Making:   Impulsive   Social Functioning  Social Maturity:   Impulsive; Irresponsible   Social Judgement:   Heedless   Stress  Stressors:   Family conflict; Grief/losses; Housing; Scientist, research (physical sciences); Work; Relationship; Financial (The client lost his job in September of 2022. He was paid out through last month. He plans on selling his house and move to his girlfriend of 16 years' house in the future.)   Coping Ability:   Overwhelmed; Exhausted   Skill Deficits:   Self-control   Supports:   Friends/Service system (Father and two sisters are supports as are his son and  girlfriend of 16 years.)     Religion: Religion/Spirituality Are You A Religious Person?: No  Leisure/Recreation: Leisure / Recreation Do You Have Hobbies?: Yes Leisure and Hobbies: "excercising and attending athletics...esp ones involving my son. He is a pretty good soccer and football player."  Exercise/Diet: Exercise/Diet Do You Exercise?: Yes How Many Times a Week Do You Exercise?: Daily Have You Gained or Lost A Significant Amount of Weight in the Past Six Months?: No Do You Follow a Special Diet?: No Do You Have Any Trouble Sleeping?: Yes Explanation of Sleeping Difficulties: insomnia most nights--hard to fall asleep and stay asleep   CCA Employment/Education Employment/Work Situation: Employment / Work Situation Employment Situation: Unemployed Patient's Job has Been Impacted by Current Illness: No Describe how Patient's Job has Been  Impacted: pt recently lost his job What is the Longest Time Patient has Held a Job?: 18 years Where was the Patient Employed at that Time?: [employer] Has Patient ever Been in the [employer]?: No  Education: Education Is Patient Currently Attending School?: No Did Teacher, adult education From [employer]?: Yes Did Physicist, medical?: No Did You Have An Individualized Education Program (IIEP): No Did You Have Any Difficulty At [employer]?: No   CCA Family/Childhood History Family and Relationship History: Family history Marital status: Long term relationship Long term relationship, how long?: 16 years What types of issues is patient dealing with in the relationship?: pts long term girlfriend is in the process of moving out due to recently buying her own home Additional relationship information: pt is very close with girlfriends son--"my son" Are you sexually active?: Yes What is your sexual orientation?: heterosexual Does patient have children?: Yes How many children?: 1 How is patient's relationship with their children?: Pt has one step-son via his girlfriend and reports that they have a good relationship  Childhood History:  Childhood History By whom was/is the patient raised?: Both parents Additional childhood history information: Pt reports that he grew up in [place]  Description of patient's relationship with caregiver when they were a child: "amazing" Patient's description of current relationship with people who raised him/her: Pt reports that his mother is deceased and that he is the primary caretaker of his father who has health related issues due to having Polio at a young age How were you disciplined when you got in trouble as a child/adolescent?: "It switched between mom and dad. She was 0-100 and he was more even and they would come together to do it." Does patient have siblings?: Yes Number of Siblings: 2 Description of patient's current relationship with siblings: Pt reports that he  has an "excellent" relationship Did patient suffer any verbal/emotional/physical/sexual abuse as a child?: No Did patient suffer from severe childhood neglect?: No Has patient ever been sexually abused/assaulted/raped as an adolescent or adult?: No Was the patient ever a victim of a crime or a disaster?: No Witnessed domestic violence?: No Has patient been affected by domestic violence as an adult?: No  Child/Adolescent Assessment:     CCA Substance Use Alcohol/Drug Use: Alcohol / Drug Use Pain Medications: SEE MAR Prescriptions: SEE MAR Over the Counter: SEE MAR History of alcohol / drug use?: Yes Longest period of sobriety (when/how long): 4 years; 2017 went to [facility] and was sober from 2017-2021 Negative Consequences of Use: Legal, Personal relationships Withdrawal Symptoms: Sweats, Tachycardia, Irritability, Anorexia Substance #1 Name of Substance 1: ETOH: Patient denies drug use. However, has a hx of alcohol abuse. He started drinking sometime in [employer]. His  average amount of use for the past 2-3 months are binges. He describes his current use as very sporadic. 1 - Age of First Use: High School; first drank age 24; started drinking regularly around age 54 1 - Amount (size/oz): variable amounts--pt reports 3 bottles of wine or 2 pints of vodka; binge drinks 1 - Frequency: "I was drinking on the weekends 1 - Duration: on-going 1 - Last Use / Amount: last night 1 - Method of Aquiring: legal                       ASAM's:  Six Dimensions of Multidimensional Assessment  Dimension 1:  Acute Intoxication and/or Withdrawal Potential:   Dimension 1:  Description of individual's past and current experiences of substance use and withdrawal: recent intoxication; recently discharged from [facility] 2:  Biomedical Conditions and Complications:   Dimension 2:  Description of patient's biomedical conditions and  complications: None  Dimension 3:   Emotional, Behavioral, or Cognitive Conditions and Complications:  Dimension 3:  Description of emotional, behavioral, or cognitive conditions and complications: Client denies having suicidal thoughts prior to going to [employer]  Dimension 4:  Readiness to Change:  Dimension 4:  Description of Readiness to Change criteria: Client rates desire to quit drinking completely as a 10 on a scale of 1-10 with 10 being committed to quit and 1 being not committed.  Dimension 5:  Relapse, Continued use, or Continued Problem Potential:  Dimension 5:  Relapse, continued use, or continued problem potential critiera description: Client drank as recently as yesterday in spite of wanting to quit due to his mind racing and trying to block it out.  Dimension 6:  Recovery/Living Environment:  Dimension 6:  Recovery/Iiving environment criteria description: His girlfriend does not drink and is "more than supportive" of the client's recovery. His girlfriend is moving so client will live alone until he sells his house.  ASAM Severity Score: ASAM's Severity Rating Score: 5  ASAM Recommended Level of Treatment: ASAM Recommended Level of Treatment: Level II Intensive Outpatient Treatment   Substance use Disorder (SUD) Substance Use Disorder (SUD)  Checklist Symptoms of Substance Use: Continued use despite having a persistent/recurrent physical/psychological problem caused/exacerbated by use, Continued use despite persistent or recurrent social, interpersonal problems, caused or exacerbated by use, Persistent desire or unsuccessful efforts to cut down or control use, Recurrent use that results in a failure to fulfill major role obligations (work, school, home), Evidence of tolerance, Evidence of withdrawal (Comment), Large amounts of time spent to obtain, use or recover from the substance(s), Repeated use in physically hazardous situations, Substance(s) often taken in larger amounts or over longer times than was intended,  Presence of craving or strong urge to use (cravings over the last 2 months)  Recommendations for Services/Supports/Treatments: Recommendations for Services/Supports/Treatments Recommendations For Services/Supports/Treatments: CD-IOP Intensive Chemical Dependency Program, Medication Management  DSM5 Diagnoses: Patient Active Problem List   Diagnosis Date Noted   Alcohol use disorder, severe, dependence ([place]) 02/14/2022   MDD (major depressive disorder), recurrent episode, severe ([place]) 02/13/2022   Substance induced mood disorder ([place])    Suicidal ideation    Alcohol withdrawal syndrome with complication, with unspecified complication ([place]) 0000000   Alcohol withdrawal ([place]) 01/02/2022    Patient Centered Plan: Patient is on the following Treatment Plan(s):  Substance Abuse   Referrals to Alternative Service(s): Referred to Alternative Service(s):   Place:   Date:   Time:    Referred to  Alternative Service(s):   Place:   Date:   Time:    Referred to Alternative Service(s):   Place:   Date:   Time:    Referred to Alternative Service(s):   Place:   Date:   Time:      Collaboration of Care: Other no collaboration of care indicated at this time.   Patient/Guardian was advised Release of Information must be obtained prior to any record release in order to collaborate their care with an outside provider. Patient/Guardian was advised if they have not already done so to contact the registration department to sign all necessary forms in order for [place] to release information regarding their care.   Consent: Patient/Guardian gives verbal consent for treatment and assignment of benefits for services provided during this visit. Patient/Guardian expressed understanding and agreed to proceed.   Adam Phenix, MA, LCSW, [place], LCAS

## 2022-02-19 NOTE — Plan of Care (Deleted)
Keith Miller agrees to the Care Plan  Problem: Keith Miller will abstain from using alcohol as evidenced by having no urine screens testing positive for alcohol.  Goal:  02/19/2022 Outcome: Not Applicable Goal: LTG: Improve quality of life by maintaining ongoing abstinence from all mood-altering substances: Input needed on appropriate metric Outcome: Not Applicable Goal: LTG: Keith Miller WILL INCREASE COPING SKILLS TO PROMOTE LONG-TERM RECOVERY AND IMPROVE ABILITY TO PERFORM DAILY ACTIVITIES Outcome: Not Applicable Goal: STG: Promote adherence to treatment regimen Outcome: Not Applicable

## 2022-02-19 NOTE — Plan of Care (Deleted)
Client agrees to Care Plan  Problem: Paras will abstain from using alcohol as evidenced by having no urine screens testing positive for alcohol.  Goal:  02/19/2022 Outcome: Not Applicable Goal: LTG: Improve quality of life by maintaining ongoing abstinence from all mood-altering substances: Input needed on appropriate metric Outcome: Not Applicable Goal: LTG: Sasuke WILL INCREASE COPING SKILLS TO PROMOTE LONG-TERM RECOVERY AND IMPROVE ABILITY TO PERFORM DAILY ACTIVITIES Outcome: Not Applicable Goal: STG: Promote adherence to treatment regimen Outcome: Not Applicable

## 2022-02-19 NOTE — Plan of Care (Deleted)
°  Problem: Keith Miller will abstain from using alcohol as evidenced by having no urine screens testing positive for alcohol.  °Goal:  02/19/2022 °Outcome: Not Applicable °Goal: LTG: Improve quality of life by maintaining ongoing abstinence from all mood-altering substances: Input needed on appropriate metric °Outcome: Not Applicable °Goal: LTG: Keith Miller WILL INCREASE COPING SKILLS TO PROMOTE LONG-TERM RECOVERY AND IMPROVE ABILITY TO PERFORM DAILY ACTIVITIES °Outcome: Not Applicable °Goal: STG: Promote adherence to treatment regimen °Outcome: Not Applicable °  °

## 2022-02-19 NOTE — Plan of Care (Signed)
Client agrees to plan.  Problem: Keith Miller will abstain from using alcohol as evidenced by having no urine screens testing positive for alcohol.  Goal:  02/19/2022 Outcome: Not Applicable Goal: LTG: Improve quality of life by maintaining ongoing abstinence from all mood-altering substances: Input needed on appropriate metric Outcome: Not Applicable Goal: LTG: [name] WILL INCREASE COPING SKILLS TO PROMOTE LONG-TERM RECOVERY AND IMPROVE ABILITY TO PERFORM DAILY ACTIVITIES Outcome: Not Applicable Goal: STG: Promote adherence to treatment regimen Outcome: Not Applicable

## 2022-02-21 ENCOUNTER — Telehealth (HOSPITAL_COMMUNITY): Payer: Self-pay | Admitting: Licensed Clinical Social Worker

## 2022-02-21 ENCOUNTER — Encounter (HOSPITAL_COMMUNITY): Payer: 59

## 2022-02-22 ENCOUNTER — Telehealth (HOSPITAL_COMMUNITY): Payer: Self-pay | Admitting: Licensed Clinical Social Worker

## 2022-02-22 ENCOUNTER — Encounter (HOSPITAL_COMMUNITY): Payer: 59

## 2022-02-22 NOTE — Telephone Encounter (Signed)
As Keith Miller did not call this morning to indicate as to whether or not he would be attending group as he indicated that he would do when the therapist spoke with him yesterday, the therapist calls Keith Miller leaving a HIPAA-compliant voicemail asking him to call the therapist at [phone] if he is planning on attending on 02/26/22.   Myrna Blazer, MA, LCSW, LCMHC, LCAS

## 2022-02-26 ENCOUNTER — Encounter (HOSPITAL_COMMUNITY): Payer: 59

## 2022-02-28 ENCOUNTER — Encounter (HOSPITAL_COMMUNITY): Payer: Self-pay | Admitting: *Deleted

## 2022-02-28 ENCOUNTER — Other Ambulatory Visit: Payer: Self-pay

## 2022-02-28 ENCOUNTER — Encounter (HOSPITAL_COMMUNITY): Payer: 59

## 2022-02-28 ENCOUNTER — Emergency Department (HOSPITAL_COMMUNITY)
Admission: EM | Admit: 2022-02-28 | Discharge: 2022-02-28 | Discharge status: Against Medical Advice | Payer: 59 | Attending: Emergency Medicine | Admitting: Emergency Medicine

## 2022-02-28 DIAGNOSIS — Y908 Blood alcohol level of 240 mg/100 ml or more: Secondary | ICD-10-CM | POA: Diagnosis not present

## 2022-02-28 DIAGNOSIS — F1010 Alcohol abuse, uncomplicated: Secondary | ICD-10-CM | POA: Insufficient documentation

## 2022-02-28 LAB — CBC WITH DIFFERENTIAL/PLATELET
Abs Immature Granulocytes: 0.01 10*3/uL (ref 0.00–0.07)
Basophils Absolute: 0.1 10*3/uL (ref 0.0–0.1)
Basophils Relative: 2 %
Eosinophils Absolute: 0.1 10*3/uL (ref 0.0–0.5)
Eosinophils Relative: 1 %
HCT: 47.4 % (ref 39.0–52.0)
Hemoglobin: 15.8 g/dL (ref 13.0–17.0)
Immature Granulocytes: 0 %
Lymphocytes Relative: 29 %
Lymphs Abs: 1.6 10*3/uL (ref 0.7–4.0)
MCH: 31.9 pg (ref 26.0–34.0)
MCHC: 33.3 g/dL (ref 30.0–36.0)
MCV: 95.6 fL (ref 80.0–100.0)
Monocytes Absolute: 0.3 10*3/uL (ref 0.1–1.0)
Monocytes Relative: 6 %
Neutro Abs: 3.3 10*3/uL (ref 1.7–7.7)
Neutrophils Relative %: 62 %
Platelets: 227 10*3/uL (ref 150–400)
RBC: 4.96 MIL/uL (ref 4.22–5.81)
RDW: 12.9 % (ref 11.5–15.5)
WBC: 5.3 10*3/uL (ref 4.0–10.5)
nRBC: 0 % (ref 0.0–0.2)

## 2022-02-28 LAB — COMPREHENSIVE METABOLIC PANEL
ALT: 23 U/L (ref 0–44)
AST: 37 U/L (ref 15–41)
Albumin: 3.9 g/dL (ref 3.5–5.0)
Alkaline Phosphatase: 37 U/L — ABNORMAL LOW (ref 38–126)
Anion gap: 10 (ref 5–15)
BUN: 5 mg/dL — ABNORMAL LOW (ref 6–20)
CO2: 28 mmol/L (ref 22–32)
Calcium: 8.3 mg/dL — ABNORMAL LOW (ref 8.9–10.3)
Chloride: 103 mmol/L (ref 98–111)
Creatinine, Ser: 1.08 mg/dL (ref 0.61–1.24)
GFR, Estimated: >60 mL/min (ref >60)
Glucose, Bld: 102 mg/dL — ABNORMAL HIGH (ref 70–99)
Potassium: 3.8 mmol/L (ref 3.5–5.1)
Sodium: 141 mmol/L (ref 135–145)
Total Bilirubin: 0.4 mg/dL (ref 0.3–1.2)
Total Protein: 6.7 g/dL (ref 6.5–8.1)

## 2022-02-28 LAB — LIPASE, BLOOD: Lipase: 66 U/L — ABNORMAL HIGH (ref 11–51)

## 2022-02-28 LAB — ETHANOL: Alcohol, Ethyl (B): 341 mg/dL (ref <10)

## 2022-02-28 NOTE — ED Notes (Addendum)
Pt was upsetting he wasn't getting medication at that very moment. Charge nurse and I told him the doctor will come talk to him again. Pt stated he has been waiting for two hours and no one has helped him. Pt said he was leaving and walked out EMS bay. MD was made aware  ?

## 2022-02-28 NOTE — ED Provider Notes (Signed)
[facility]Provider Note ? ? ?CSN: [id number] ?Arrival date & time: 02/28/22  1959 ? ?  ? ?History ? ?Chief Complaint  ?Patient presents with  ? Alcohol Problem  ? ? ?Keith Miller is a 51 y.o. male. ? ?Patient presents with complaint of alcohol abuse.  He states that he wants to try to quit.  His last drink of alcohol was today around 3 to 4 PM.  Denies any hallucinations denies any vomiting denies any seizures.  Denies any headache or pain. ? ? ?  ? ?Home Medications ?Prior to Admission medications   ?Medication Sig Start Date End Date Taking? Authorizing Provider  ?escitalopram (LEXAPRO) 20 MG tablet Take 1 tablet (20 mg total) by mouth daily. 02/16/22   Massengill, Harrold Donath, MD  ?naltrexone (DEPADE) 50 MG tablet Take 1 tablet (50 mg total) by mouth daily. 02/16/22   Massengill, Harrold Donath, MD  ?traZODone (DESYREL) 50 MG tablet Take 1 tablet (50 mg total) by mouth at bedtime as needed for sleep. 02/16/22   Phineas Inches, MD  ?   ? ?Allergies    ?Patient has no known allergies.   ? ?Review of Systems   ?Review of Systems  ?Constitutional:  Negative for fever.  ?HENT:  Negative for ear pain and sore throat.   ?Eyes:  Negative for pain.  ?Respiratory:  Negative for cough.   ?Cardiovascular:  Negative for chest pain.  ?Gastrointestinal:  Negative for abdominal pain.  ?Genitourinary:  Negative for flank pain.  ?Musculoskeletal:  Negative for back pain.  ?Skin:  Negative for color change and rash.  ?Neurological:  Negative for syncope.  ?All other systems reviewed and are negative. ? ?Physical Exam ?Updated Vital Signs ?BP 117/90   Pulse 65   Temp 97.9 ?F (36.6 ?C) (Oral)   Resp 18   Ht 6' (1.829 m)   Wt 95.3 kg   SpO2 99%   BMI 28.48 kg/m?  ?Physical Exam ?Constitutional:   ?   Appearance: He is well-developed.  ?HENT:  ?   Head: Normocephalic.  ?   Nose: Nose normal.  ?Eyes:  ?   Extraocular Movements: Extraocular movements intact.  ?Cardiovascular:  ?   Rate and Rhythm:  Normal rate.  ?Pulmonary:  ?   Effort: Pulmonary effort is normal.  ?Skin: ?   Coloration: Skin is not jaundiced.  ?Neurological:  ?   General: No focal deficit present.  ?   Mental Status: He is alert and oriented to person, place, and time. Mental status is at baseline.  ?   Cranial Nerves: No cranial nerve deficit.  ?   Motor: No weakness.  ?   Gait: Gait normal.  ? ? ?ED Results / Procedures / Treatments   ?Labs ?(all labs ordered are listed, but only abnormal results are displayed) ?Labs Reviewed  ?COMPREHENSIVE METABOLIC PANEL - Abnormal; Notable for the following components:  ?    Result Value  ? Glucose, Bld 102 (*)   ? BUN 5 (*)   ? Calcium 8.3 (*)   ? Alkaline Phosphatase 37 (*)   ? All other components within normal limits  ?LIPASE, BLOOD - Abnormal; Notable for the following components:  ? Lipase 66 (*)   ? All other components within normal limits  ?ETHANOL - Abnormal; Notable for the following components:  ? Alcohol, Ethyl (B) 341 (*)   ? All other components within normal limits  ?CBC WITH DIFFERENTIAL/PLATELET  ? ? ?EKG ?None ? ?Radiology ?No results found. ? ?  Procedures ?Procedures  ? ? ?Medications Ordered in ED ?Medications - No data to display ? ?ED Course/ Medical Decision Making/ A&P ?  ?                        ?Medical Decision Making ?Amount and/or Complexity of Data Reviewed ?Labs: ordered. ? ? ?Review of records shows prior visits for alcohol abuse. ? ?  ?  ?Co morbidities that complicate the patient evaluation ?  ?Chronic alcohol abuse ?  ?  ? ?  ?Lab Tests: ?  ?I Ordered, and personally interpreted labs.  The pertinent results include: CBC CMP chemistry.  Alcohol level elevated greater than 300.  Chemistry otherwise unremarkable. ?  ?  ?  ?Social Determinants of Health: ?  ?Chronic alcohol abuse ?  ? ?Patient grew tired of waiting for his labs.  Ultimately eloped prior to completion of evaluation.  Per nursing staff he had a steady gait despite elevated blood alcohol levels.  I suspect  these levels are not unusual for the patient given his history. ? ? ? ? ? ? ? ?Final Clinical Impression(s) / ED Diagnoses ?Final diagnoses:  ?Alcohol abuse  ? ? ?Rx / DC Orders ?ED Discharge Orders   ? ? None  ? ?  ? ? ?  ?Cheryll Cockayne, MD ?02/28/22 2154 ? ?

## 2022-02-28 NOTE — ED Triage Notes (Signed)
Pt come in requesting ETOH Detox, last drink about 4 pm today. ?

## 2022-03-01 ENCOUNTER — Encounter (HOSPITAL_COMMUNITY): Payer: 59

## 2022-03-03 ENCOUNTER — Emergency Department (HOSPITAL_COMMUNITY)
Admission: EM | Admit: 2022-03-03 | Discharge: 2022-03-04 | Disposition: A | Discharge status: Other Acute Inpatient Hospital | Payer: 59 | Source: Home / Self Care | Attending: Emergency Medicine | Admitting: Emergency Medicine

## 2022-03-03 DIAGNOSIS — F332 Major depressive disorder, recurrent severe without psychotic features: Secondary | ICD-10-CM | POA: Diagnosis not present

## 2022-03-03 DIAGNOSIS — F10239 Alcohol dependence with withdrawal, unspecified: Secondary | ICD-10-CM | POA: Diagnosis not present

## 2022-03-03 DIAGNOSIS — F1094 Alcohol use, unspecified with alcohol-induced mood disorder: Secondary | ICD-10-CM

## 2022-03-03 DIAGNOSIS — Z20822 Contact with and (suspected) exposure to covid-19: Secondary | ICD-10-CM | POA: Insufficient documentation

## 2022-03-03 DIAGNOSIS — R7309 Other abnormal glucose: Secondary | ICD-10-CM | POA: Insufficient documentation

## 2022-03-03 DIAGNOSIS — F1020 Alcohol dependence, uncomplicated: Secondary | ICD-10-CM

## 2022-03-03 DIAGNOSIS — Z8659 Personal history of other mental and behavioral disorders: Secondary | ICD-10-CM

## 2022-03-03 DIAGNOSIS — F10920 Alcohol use, unspecified with intoxication, uncomplicated: Secondary | ICD-10-CM

## 2022-03-03 LAB — CBC
HCT: 43.5 % (ref 39.0–52.0)
Hemoglobin: 14.6 g/dL (ref 13.0–17.0)
MCH: 32.4 pg (ref 26.0–34.0)
MCHC: 33.6 g/dL (ref 30.0–36.0)
MCV: 96.5 fL (ref 80.0–100.0)
Platelets: 151 10*3/uL (ref 150–400)
RBC: 4.51 MIL/uL (ref 4.22–5.81)
RDW: 13.2 % (ref 11.5–15.5)
WBC: 3.8 10*3/uL — ABNORMAL LOW (ref 4.0–10.5)
nRBC: 0 % (ref 0.0–0.2)

## 2022-03-03 LAB — COMPREHENSIVE METABOLIC PANEL
ALT: 21 U/L (ref 0–44)
AST: 35 U/L (ref 15–41)
Albumin: 3.5 g/dL (ref 3.5–5.0)
Alkaline Phosphatase: 32 U/L — ABNORMAL LOW (ref 38–126)
Anion gap: 10 (ref 5–15)
BUN: 5 mg/dL — ABNORMAL LOW (ref 6–20)
CO2: 24 mmol/L (ref 22–32)
Calcium: 7.5 mg/dL — ABNORMAL LOW (ref 8.9–10.3)
Chloride: 111 mmol/L (ref 98–111)
Creatinine, Ser: 0.92 mg/dL (ref 0.61–1.24)
GFR, Estimated: >60 mL/min (ref >60)
Glucose, Bld: 100 mg/dL — ABNORMAL HIGH (ref 70–99)
Potassium: 3.6 mmol/L (ref 3.5–5.1)
Sodium: 145 mmol/L (ref 135–145)
Total Bilirubin: 0.5 mg/dL (ref 0.3–1.2)
Total Protein: 5.5 g/dL — ABNORMAL LOW (ref 6.5–8.1)

## 2022-03-03 LAB — SALICYLATE LEVEL: Salicylate Lvl: <7 mg/dL — ABNORMAL LOW (ref 7.0–30.0)

## 2022-03-03 LAB — RAPID URINE DRUG SCREEN, HOSP PERFORMED
Amphetamines: NOT DETECTED
Barbiturates: NOT DETECTED
Benzodiazepines: POSITIVE — AB
Cocaine: NOT DETECTED
Opiates: NOT DETECTED
Tetrahydrocannabinol: NOT DETECTED

## 2022-03-03 LAB — ETHANOL: Alcohol, Ethyl (B): 457 mg/dL (ref <10)

## 2022-03-03 LAB — RESP PANEL BY RT-PCR (FLU A&B, COVID) ARPGX2
Influenza A by PCR: NEGATIVE
Influenza B by PCR: NEGATIVE
SARS Coronavirus 2 by RT PCR: NEGATIVE

## 2022-03-03 LAB — ACETAMINOPHEN LEVEL: Acetaminophen (Tylenol), Serum: <10 ug/mL — ABNORMAL LOW (ref 10–30)

## 2022-03-03 LAB — CBG MONITORING, ED: Glucose-Capillary: 113 mg/dL — ABNORMAL HIGH (ref 70–99)

## 2022-03-03 MED ORDER — THIAMINE HCL 100 MG PO TABS
100.0000 mg | ORAL_TABLET | Freq: Every day | ORAL | Status: DC
  Administered 2022-03-04: 100 mg via ORAL
  Filled 2022-03-03: qty 1

## 2022-03-03 MED ORDER — THIAMINE HCL 100 MG PO TABS: 100.0000 mg | ORAL_TABLET | Freq: Every day | ORAL | Status: DC

## 2022-03-03 MED ORDER — THIAMINE HCL 100 MG/ML IJ SOLN: 100.0000 mg | Freq: Every day | INTRAMUSCULAR | Status: DC

## 2022-03-03 MED ORDER — LORAZEPAM 1 MG PO TABS
0.0000 mg | ORAL_TABLET | Freq: Four times a day (QID) | ORAL | Status: DC
  Administered 2022-03-03: 2 mg via ORAL
  Filled 2022-03-03: qty 2

## 2022-03-03 MED ORDER — LORAZEPAM 1 MG PO TABS
0.0000 mg | ORAL_TABLET | Freq: Four times a day (QID) | ORAL | Status: DC
  Administered 2022-03-04: 2 mg via ORAL
  Filled 2022-03-03: qty 2

## 2022-03-03 MED ORDER — LORAZEPAM 1 MG PO TABS: 0.0000 mg | ORAL_TABLET | Freq: Two times a day (BID) | ORAL | Status: DC

## 2022-03-03 MED ORDER — ADULT MULTIVITAMIN W/MINERALS CH
1.0000 | ORAL_TABLET | Freq: Every day | ORAL | Status: DC
  Administered 2022-03-04: 1 via ORAL
  Filled 2022-03-03: qty 1

## 2022-03-03 MED ORDER — SODIUM CHLORIDE 0.9 % IV BOLUS
1000.0000 mL | Freq: Once | INTRAVENOUS | Status: AC
  Administered 2022-03-03: 1000 mL via INTRAVENOUS

## 2022-03-03 MED ORDER — LORAZEPAM 2 MG/ML IJ SOLN: 1.0000 mg | INTRAMUSCULAR | Status: DC | PRN

## 2022-03-03 MED ORDER — THIAMINE HCL 100 MG/ML IJ SOLN
100.0000 mg | Freq: Every day | INTRAMUSCULAR | Status: DC
  Administered 2022-03-03: 100 mg via INTRAVENOUS
  Filled 2022-03-03: qty 2

## 2022-03-03 MED ORDER — ZIPRASIDONE MESYLATE 20 MG IM SOLR
20.0000 mg | Freq: Once | INTRAMUSCULAR | Status: AC
  Administered 2022-03-03: 20 mg via INTRAMUSCULAR
  Filled 2022-03-03: qty 20

## 2022-03-03 MED ORDER — LORAZEPAM 2 MG/ML IJ SOLN: 0.0000 mg | Freq: Two times a day (BID) | INTRAMUSCULAR | Status: DC

## 2022-03-03 MED ORDER — STERILE WATER FOR INJECTION IJ SOLN
INTRAMUSCULAR | Status: AC
  Filled 2022-03-03: qty 10

## 2022-03-03 MED ORDER — HALOPERIDOL LACTATE 5 MG/ML IJ SOLN
5.0000 mg | Freq: Once | INTRAMUSCULAR | Status: AC
Start: 2022-03-03 — End: 2022-03-03

## 2022-03-03 MED ORDER — FOLIC ACID 1 MG PO TABS
1.0000 mg | ORAL_TABLET | Freq: Every day | ORAL | Status: DC
  Administered 2022-03-04: 1 mg via ORAL
  Filled 2022-03-03: qty 1

## 2022-03-03 MED ORDER — LORAZEPAM 2 MG/ML IJ SOLN: 0.0000 mg | Freq: Four times a day (QID) | INTRAMUSCULAR | Status: DC

## 2022-03-03 MED ORDER — LORAZEPAM 1 MG PO TABS: 1.0000 mg | ORAL_TABLET | ORAL | Status: DC | PRN

## 2022-03-03 MED ORDER — HALOPERIDOL LACTATE 5 MG/ML IJ SOLN
INTRAMUSCULAR | Status: AC
  Administered 2022-03-03: 5 mg via INTRAMUSCULAR
  Filled 2022-03-03: qty 1

## 2022-03-03 NOTE — ED Notes (Addendum)
Pt up and out of bed. Pt attempted to leave the department through the EMS door. Security and [employer] at bedside. Pt directed back into room. Placed in into scrubs. ?

## 2022-03-03 NOTE — ED Notes (Signed)
Pt again got out of the bed and walked out the EMS doors, redirected inside by RNs and [facility]. ?

## 2022-03-03 NOTE — Discharge Instructions (Addendum)
It was our pleasure to provide your ER care today - we hope that you feel better. ? ?Avoid alcohol use. Drink plenty of fluids/stay well hydrated.  Follow up with AA, and use resource guide provided for additional alcohol use disorder treatment options.   ? ?You may take librium as need if withdrawal symptoms. No driving when taking, or any time when drinking alcohol.  ? ?Also follow up with primary care doctor in the next 1-2 weeks.  ? ?Return to ER if worse, new symptoms, chest pain, trouble breathing, or other concern.  ? ? ?               Intensive Outpatient Programs ? ?[facility]    [facility] ?[address]     [address] ?[place],  [place]     [place], [place] ?[phone]      [phone] ? ?[facility]   [facility]  ?(Inpatient and outpatient)  [phone] (Suboxone and Methadone) ?[address]           ?[phone]          ? ?[facility]    [facility] - Intensive Outpatient ?[address]     [address] ?[place], [place] [address]     [place], [place]  ?[phone]      [phone] ? ?[facility] (Outpatient, Inpatient, [facility] (Groups and Residental) ?(insurance only) [phone]    [place], [place]   ?       [phone] ? ?     ?[facility] (for caregivers and family) ?[address] [facility]     [address] ?[place]y [place]     [place], [place] ?[phone]      [phone] ? ?Residential Treatment Programs ? ?[facility]  ?[facility](2 years) Residential: 90 days)  [facility] ?[address] [place]      [address] ?[place], [place]     [place], [place] ?[phone]      [phone] or [phone] ? ?[facility]    [facility] ?[address]      [address] ?Jacky Kindle     [place],  [place] ?[phone]      [phone] ? ?[facility]   [facility] ?[address]     [address] ?[place], [place] [address]     [place], [place] ?[phone]      [phone] ?Admissions: 8am-3pm M-F ? ?[facility]: Residential Program [phone] Days)              [facility]: [facility]  ?[place], [place]     [place], [place]  ?[phone] or [phone]    (Walk in Hours over the weekend or by referral) ? ? ?[facility]: [facility]:[phone] (for crisis response 24 hours a day) ?

## 2022-03-03 NOTE — Care Management (Signed)
Patient presented for acute ETOH intoxication. Substance Abuse outpatient resources on patent instruction sheet ?

## 2022-03-03 NOTE — ED Notes (Signed)
Pt resting quietly in bed at this time. No S/S of distress noted.  ?

## 2022-03-03 NOTE — ED Triage Notes (Signed)
Pt here via EMS from a residence. Per EMS friends saw pt at [facility] store buying liquor around 0900. Friends went to pts father house and found pt slummed over unresponsive around 1030. History of heavy ETOH abuse and depression. Pt unresponsive with EMS.  ?On arrival pt responsive to voice and pain. Pt oriented to self only. Pt endorsed drinking liquor today.  ? ?146/98 ?HR 66 ?RR 24 ?SpO2 90% RA. Placed on 2 liters [place] with increase to 99% ?

## 2022-03-03 NOTE — BH Assessment (Signed)
3/4 @ 1600 Patient is not medically cleared - 3/4 @1236  BAL 457.  Due to current level of intoxication, patient to be assessed once more sober and able to engage in assessment.   ? ?

## 2022-03-03 NOTE — ED Provider Notes (Signed)
?[facility] EMERGENCY DEPARTMENT ?Provider Note ? ? ?CSN: [id number] ?Arrival date & time: 03/03/22  1103 ? ?  ? ?History ? ?Chief Complaint  ?Patient presents with  ? AMS  ? ? ?CRISTOFHER Miller is a 51 y.o. male. ? ?Pt with etoh use disorder, and heavy etoh consumption this AM.  Pt noted to appear intoxicated/altered, EMS was called. Pt limited historian - level 5 caveat. Denies depression or thoughts of self harm. Pt unable to quantify amount of etoh use earlier today. Denies trauma/fall. No headache. No chest pain or sob.  ? ?The history is provided by the patient, medical records and the EMS personnel. The history is limited by the condition of the patient.  ? ?  ? ?Home Medications ?Prior to Admission medications   ?Medication Sig Start Date End Date Taking? Authorizing Provider  ?escitalopram (LEXAPRO) 20 MG tablet Take 1 tablet (20 mg total) by mouth daily. 02/16/22   Massengill, Harrold Donath, MD  ?naltrexone (DEPADE) 50 MG tablet Take 1 tablet (50 mg total) by mouth daily. 02/16/22   Massengill, Harrold Donath, MD  ?traZODone (DESYREL) 50 MG tablet Take 1 tablet (50 mg total) by mouth at bedtime as needed for sleep. 02/16/22   Phineas Inches, MD  ?   ? ?Allergies    ?Patient has no known allergies.   ? ?Review of Systems   ?Review of Systems  ?Constitutional:  Negative for fever.  ?Respiratory:  Negative for shortness of breath.   ?Cardiovascular:  Negative for chest pain.  ?Gastrointestinal:  Negative for abdominal pain and vomiting.  ?Neurological:  Negative for headaches.  ? ?Physical Exam ?Updated Vital Signs ?BP 126/74 (BP Location: Left Arm)   Pulse 65   Temp 98.3 ?F (36.8 ?C) (Oral)   Resp (!) 22   SpO2 97%  ?Physical Exam ?Vitals and nursing note reviewed.  ?Constitutional:   ?   Appearance: Normal appearance. He is well-developed.  ?HENT:  ?   Head: Atraumatic.  ?   Nose: Nose normal.  ?   Mouth/Throat:  ?   Mouth: Mucous membranes are moist.  ?   Pharynx: Oropharynx is clear.  ?Eyes:  ?    General: No scleral icterus. ?   Conjunctiva/sclera: Conjunctivae normal.  ?   Pupils: Pupils are equal, round, and reactive to light.  ?Neck:  ?   Trachea: No tracheal deviation.  ?Cardiovascular:  ?   Rate and Rhythm: Normal rate and regular rhythm.  ?   Pulses: Normal pulses.  ?   Heart sounds: Normal heart sounds. No murmur heard. ?  No friction rub. No gallop.  ?Pulmonary:  ?   Effort: Pulmonary effort is normal. No accessory muscle usage or respiratory distress.  ?   Breath sounds: Normal breath sounds.  ?Abdominal:  ?   General: Bowel sounds are normal. There is no distension.  ?   Palpations: Abdomen is soft.  ?   Tenderness: There is no abdominal tenderness.  ?Genitourinary: ?   Comments: No cva tenderness. ?Musculoskeletal:     ?   General: No swelling or tenderness.  ?   Cervical back: Normal range of motion and neck supple. No rigidity.  ?Skin: ?   General: Skin is warm and dry.  ?   Findings: No rash.  ?Neurological:  ?   Mental Status: He is alert.  ?   Comments: Resting. Easily aroused. Moves bil extremities purposefully.   ?Psychiatric:  ?   Comments: ?intoxicated.   ? ? ?ED  Results / Procedures / Treatments   ?Labs ?(all labs ordered are listed, but only abnormal results are displayed) ?Results for orders placed or performed during the hospital encounter of 03/03/22  ?Resp Panel by RT-PCR (Flu A&B, Covid) Nasopharyngeal Swab  ? Specimen: Nasopharyngeal Swab; Nasopharyngeal(NP) swabs in vial transport medium  ?Result Value Ref Range  ? SARS Coronavirus 2 by RT PCR NEGATIVE NEGATIVE  ? Influenza A by PCR NEGATIVE NEGATIVE  ? Influenza B by PCR NEGATIVE NEGATIVE  ?Ethanol  ?Result Value Ref Range  ? Alcohol, Ethyl (B) 457 (HH) <10 mg/dL  ?Rapid urine drug screen (hospital performed)  ?Result Value Ref Range  ? Opiates NONE DETECTED NONE DETECTED  ? Cocaine NONE DETECTED NONE DETECTED  ? Benzodiazepines POSITIVE (A) NONE DETECTED  ? Amphetamines NONE DETECTED NONE DETECTED  ? Tetrahydrocannabinol NONE  DETECTED NONE DETECTED  ? Barbiturates NONE DETECTED NONE DETECTED  ?Acetaminophen level  ?Result Value Ref Range  ? Acetaminophen (Tylenol), Serum <10 (L) 10 - 30 ug/mL  ?Salicylate level  ?Result Value Ref Range  ? Salicylate Lvl <7.0 (L) 7.0 - 30.0 mg/dL  ?Comprehensive metabolic panel  ?Result Value Ref Range  ? Sodium 145 135 - 145 mmol/L  ? Potassium 3.6 3.5 - 5.1 mmol/L  ? Chloride 111 98 - 111 mmol/L  ? CO2 24 22 - 32 mmol/L  ? Glucose, Bld 100 (H) 70 - 99 mg/dL  ? BUN 5 (L) 6 - 20 mg/dL  ? Creatinine, Ser 0.92 0.61 - 1.24 mg/dL  ? Calcium 7.5 (L) 8.9 - 10.3 mg/dL  ? Total Protein 5.5 (L) 6.5 - 8.1 g/dL  ? Albumin 3.5 3.5 - 5.0 g/dL  ? AST 35 15 - 41 U/L  ? ALT 21 0 - 44 U/L  ? Alkaline Phosphatase 32 (L) 38 - 126 U/L  ? Total Bilirubin 0.5 0.3 - 1.2 mg/dL  ? GFR, Estimated >60 >60 mL/min  ? Anion gap 10 5 - 15  ?CBC  ?Result Value Ref Range  ? WBC 3.8 (L) 4.0 - 10.5 K/uL  ? RBC 4.51 4.22 - 5.81 MIL/uL  ? Hemoglobin 14.6 13.0 - 17.0 g/dL  ? HCT 43.5 39.0 - 52.0 %  ? MCV 96.5 80.0 - 100.0 fL  ? MCH 32.4 26.0 - 34.0 pg  ? MCHC 33.6 30.0 - 36.0 g/dL  ? RDW 13.2 11.5 - 15.5 %  ? Platelets 151 150 - 400 K/uL  ? nRBC 0.0 0.0 - 0.2 %  ?CBG monitoring, ED  ?Result Value Ref Range  ? Glucose-Capillary 113 (H) 70 - 99 mg/dL  ? ? ? ?EKG ?EKG Interpretation ? ?Date/Time:  Saturday March 03 2022 11:12:33 EST ?Ventricular Rate:  67 ?PR Interval:  147 ?QRS Duration: 88 ?QT Interval:  381 ?QTC Calculation: 403 ?R Axis:   22 ?Text Interpretation: Sinus rhythm Confirmed by Cathren Laine ([id number]) on 03/03/2022 12:22:54 PM ? ?Radiology ?No results found. ? ?Procedures ?Procedures  ? ? ?Medications Ordered in ED ?Medications - No data to display ? ?ED Course/ Medical Decision Making/ A&P ?  ?                        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 ?Amount and/or Complexity of Data Reviewed ?Independent Historian: EMS ?   Details: additional hx ?External Data Reviewed: labs and notes. ?Labs: ordered. ?ECG/medicine tests: ordered and independent interpretation performed. Decision-making details documented in ED Course. ? ?Risk ?OTC drugs. ?Prescription drug management. ?Decision regarding hospitalization. ? ? ?Iv ns. Continuous pulse ox and cardiac monitoring. Labs ordered/sent.  ? ?Reviewed nursing notes and prior charts for additional history.  External reports reviewed. Additional hx from: EMS ? ?Labs reviewed/interpreted by me - glucose ok. Etoh v high.  ? ?Cardiac monitor: sinus rhythm, rate 70. ? ?Po fluids/food provided.  ? ?Family/[employer] presented ivc papers from family alleging that patient had recently been feeling depressed and expressed suicidal sounding thoughts.  Additional labs added, and will get [facility] health consult (when pt sober).  ? ?The patient has been placed in psychiatric observation due to the need to provide a safe environment for the patient while obtaining psychiatric consultation and evaluation, as well as ongoing medical and medication management to treat the patient's condition.  The patient has been placed under full IVC at this time. ? ?Will start ciwa protocol after patient metabolizes significant amount of etoh on board.  ? ?Disposition per [facility] team.  ? ?1505, signed out to Dr Silverio Lay to f/u with [facility] recs, recheck pt, and dispo appropriately.  ? ? ? ? ? ? ? ? ?Final Clinical Impression(s) / ED Diagnoses ?Final diagnoses:  ?None  ? ? ?Rx / DC Orders ?ED Discharge Orders   ? ? None  ? ?  ? ? ?  ?Cathren Laine, MD ?03/03/22 1507 ? ?

## 2022-03-03 NOTE — ED Notes (Signed)
Pt at nursing station using phone.  

## 2022-03-03 NOTE — ED Provider Notes (Signed)
?  Physical Exam  ?BP 112/73   Pulse 76   Temp 98.3 ?F (36.8 ?C) (Oral)   Resp (!) 21   SpO2 96%  ? ? ?The patient is currently under IVC by family for suicidal ideation.  He is currently intoxicated with an alcohol level of 457.  He had previously been administered 5 mg of Haldol by Dr. Darl Householder.  He has now attempted to flee the emergency department 3 times.  He was moved back to the behavioral health monitoring section of the emergency department and continued to leave his room, was not complying with safety protocols.  20 mg of IM Geodon was ordered. ? ? ? ? ?  ?Regan Lemming, MD ?03/03/22 1650 ? ?

## 2022-03-03 NOTE — ED Notes (Signed)
Pt out of bed, removed IV, walking towards EMS doors, unable to redirect pt. Pt left building, stopped by [employer] and brought back to room. EDP aware, IM haldol ordered and given. ?

## 2022-03-04 ENCOUNTER — Inpatient Hospital Stay (HOSPITAL_COMMUNITY)
Admit: 2022-03-04 | Discharge: 2022-03-07 | DRG: 897 | Disposition: A | Discharge status: Home / Self Care | Payer: 59 | Source: Intra-hospital | Attending: Emergency Medicine | Admitting: Emergency Medicine

## 2022-03-04 ENCOUNTER — Encounter (HOSPITAL_COMMUNITY): Payer: Self-pay | Admitting: Urology

## 2022-03-04 ENCOUNTER — Other Ambulatory Visit: Payer: Self-pay

## 2022-03-04 DIAGNOSIS — F10239 Alcohol dependence with withdrawal, unspecified: Principal | ICD-10-CM | POA: Diagnosis present

## 2022-03-04 DIAGNOSIS — Z79899 Other long term (current) drug therapy: Secondary | ICD-10-CM

## 2022-03-04 DIAGNOSIS — Z818 Family history of other mental and behavioral disorders: Secondary | ICD-10-CM

## 2022-03-04 DIAGNOSIS — F10939 Alcohol use, unspecified with withdrawal, unspecified: Secondary | ICD-10-CM | POA: Diagnosis present

## 2022-03-04 DIAGNOSIS — F419 Anxiety disorder, unspecified: Secondary | ICD-10-CM | POA: Diagnosis present

## 2022-03-04 DIAGNOSIS — Y908 Blood alcohol level of 240 mg/100 ml or more: Secondary | ICD-10-CM | POA: Diagnosis present

## 2022-03-04 DIAGNOSIS — R45851 Suicidal ideations: Secondary | ICD-10-CM | POA: Diagnosis present

## 2022-03-04 DIAGNOSIS — Z20822 Contact with and (suspected) exposure to covid-19: Secondary | ICD-10-CM | POA: Diagnosis present

## 2022-03-04 DIAGNOSIS — G4700 Insomnia, unspecified: Secondary | ICD-10-CM | POA: Diagnosis present

## 2022-03-04 DIAGNOSIS — F332 Major depressive disorder, recurrent severe without psychotic features: Secondary | ICD-10-CM | POA: Diagnosis present

## 2022-03-04 DIAGNOSIS — F1020 Alcohol dependence, uncomplicated: Secondary | ICD-10-CM | POA: Diagnosis present

## 2022-03-04 DIAGNOSIS — F339 Major depressive disorder, recurrent, unspecified: Secondary | ICD-10-CM | POA: Diagnosis present

## 2022-03-04 LAB — LIPASE, BLOOD: Lipase: 55 U/L — ABNORMAL HIGH (ref 11–51)

## 2022-03-04 LAB — CBC WITH DIFFERENTIAL/PLATELET
Abs Immature Granulocytes: 0.02 10*3/uL (ref 0.00–0.07)
Basophils Absolute: 0 10*3/uL (ref 0.0–0.1)
Basophils Relative: 1 %
Eosinophils Absolute: 0.1 10*3/uL (ref 0.0–0.5)
Eosinophils Relative: 2 %
HCT: 42.5 % (ref 39.0–52.0)
Hemoglobin: 14.7 g/dL (ref 13.0–17.0)
Immature Granulocytes: 0 %
Lymphocytes Relative: 17 %
Lymphs Abs: 0.9 10*3/uL (ref 0.7–4.0)
MCH: 32.6 pg (ref 26.0–34.0)
MCHC: 34.6 g/dL (ref 30.0–36.0)
MCV: 94.2 fL (ref 80.0–100.0)
Monocytes Absolute: 0.4 10*3/uL (ref 0.1–1.0)
Monocytes Relative: 7 %
Neutro Abs: 3.8 10*3/uL (ref 1.7–7.7)
Neutrophils Relative %: 73 %
Platelets: 158 10*3/uL (ref 150–400)
RBC: 4.51 MIL/uL (ref 4.22–5.81)
RDW: 12.6 % (ref 11.5–15.5)
WBC: 5.2 10*3/uL (ref 4.0–10.5)
nRBC: 0 % (ref 0.0–0.2)

## 2022-03-04 LAB — ETHANOL: Alcohol, Ethyl (B): <10 mg/dL (ref <10)

## 2022-03-04 MED ORDER — HYDROXYZINE HCL 25 MG PO TABS
25.0000 mg | ORAL_TABLET | Freq: Four times a day (QID) | ORAL | Status: DC | PRN
  Administered 2022-03-04: 25 mg via ORAL
  Filled 2022-03-04 (×4): qty 1

## 2022-03-04 MED ORDER — GABAPENTIN 100 MG PO CAPS
100.0000 mg | ORAL_CAPSULE | Freq: Three times a day (TID) | ORAL | Status: DC
  Administered 2022-03-04 – 2022-03-07 (×8): 100 mg via ORAL
  Filled 2022-03-04 (×15): qty 1

## 2022-03-04 MED ORDER — ONDANSETRON 4 MG PO TBDP
4.0000 mg | ORAL_TABLET | Freq: Four times a day (QID) | ORAL | Status: DC | PRN
  Filled 2022-03-04: qty 1

## 2022-03-04 MED ORDER — LORAZEPAM 1 MG PO TABS: 1.0000 mg | ORAL_TABLET | Freq: Four times a day (QID) | ORAL | Status: DC | PRN

## 2022-03-04 MED ORDER — LORAZEPAM 1 MG PO TABS
1.0000 mg | ORAL_TABLET | Freq: Every day | ORAL | Status: AC
  Administered 2022-03-07: 1 mg via ORAL
  Filled 2022-03-04: qty 1

## 2022-03-04 MED ORDER — ACETAMINOPHEN 325 MG PO TABS
650.0000 mg | ORAL_TABLET | Freq: Four times a day (QID) | ORAL | Status: DC | PRN
  Administered 2022-03-04: 650 mg via ORAL
  Filled 2022-03-04 (×2): qty 2

## 2022-03-04 MED ORDER — ENSURE ENLIVE PO LIQD
237.0000 mL | Freq: Two times a day (BID) | ORAL | Status: DC
  Administered 2022-03-04 – 2022-03-07 (×6): 237 mL via ORAL
  Filled 2022-03-04 (×8): qty 237

## 2022-03-04 MED ORDER — ONDANSETRON 4 MG PO TBDP: 4.0000 mg | ORAL_TABLET | Freq: Four times a day (QID) | ORAL | Status: DC | PRN

## 2022-03-04 MED ORDER — ADULT MULTIVITAMIN W/MINERALS CH
1.0000 | ORAL_TABLET | Freq: Every day | ORAL | Status: DC
  Administered 2022-03-05 – 2022-03-07 (×3): 1 via ORAL
  Filled 2022-03-04 (×6): qty 1

## 2022-03-04 MED ORDER — HYDROXYZINE HCL 25 MG PO TABS
25.0000 mg | ORAL_TABLET | Freq: Four times a day (QID) | ORAL | Status: DC | PRN
  Administered 2022-03-04 – 2022-03-06 (×3): 25 mg via ORAL

## 2022-03-04 MED ORDER — TRAZODONE HCL 50 MG PO TABS
50.0000 mg | ORAL_TABLET | Freq: Every evening | ORAL | Status: DC | PRN
  Administered 2022-03-04 – 2022-03-06 (×3): 50 mg via ORAL
  Filled 2022-03-04 (×3): qty 1

## 2022-03-04 MED ORDER — ALUM & MAG HYDROXIDE-SIMETH 200-200-20 MG/5ML PO SUSP: 30.0000 mL | ORAL | Status: DC | PRN

## 2022-03-04 MED ORDER — ADULT MULTIVITAMIN W/MINERALS CH
1.0000 | ORAL_TABLET | Freq: Every day | ORAL | Status: DC
  Filled 2022-03-04 (×2): qty 1

## 2022-03-04 MED ORDER — LORAZEPAM 1 MG PO TABS
1.0000 mg | ORAL_TABLET | Freq: Four times a day (QID) | ORAL | Status: AC
  Administered 2022-03-04 (×3): 1 mg via ORAL
  Filled 2022-03-04 (×3): qty 1

## 2022-03-04 MED ORDER — THIAMINE HCL 100 MG PO TABS
100.0000 mg | ORAL_TABLET | Freq: Every day | ORAL | Status: DC
  Administered 2022-03-05 – 2022-03-07 (×3): 100 mg via ORAL
  Filled 2022-03-04 (×4): qty 1

## 2022-03-04 MED ORDER — LORAZEPAM 1 MG PO TABS
1.0000 mg | ORAL_TABLET | Freq: Two times a day (BID) | ORAL | Status: AC
  Administered 2022-03-06 (×2): 1 mg via ORAL
  Filled 2022-03-04 (×2): qty 1

## 2022-03-04 MED ORDER — MAGNESIUM HYDROXIDE 400 MG/5ML PO SUSP
30.0000 mL | Freq: Every day | ORAL | Status: DC | PRN
Start: 2022-03-04 — End: 2022-03-07

## 2022-03-04 MED ORDER — LORAZEPAM 1 MG PO TABS
1.0000 mg | ORAL_TABLET | Freq: Three times a day (TID) | ORAL | Status: AC
  Administered 2022-03-05 (×3): 1 mg via ORAL
  Filled 2022-03-04 (×3): qty 1

## 2022-03-04 MED ORDER — LOPERAMIDE HCL 2 MG PO CAPS: 2.0000 mg | ORAL_CAPSULE | ORAL | Status: DC | PRN

## 2022-03-04 MED ORDER — WHITE PETROLATUM EX OINT
TOPICAL_OINTMENT | CUTANEOUS | Status: AC
  Filled 2022-03-04: qty 5

## 2022-03-04 MED ORDER — THIAMINE HCL 100 MG/ML IJ SOLN: 100.0000 mg | Freq: Once | INTRAMUSCULAR | Status: AC

## 2022-03-04 MED ORDER — THIAMINE HCL 100 MG/ML IJ SOLN
100.0000 mg | Freq: Once | INTRAMUSCULAR | Status: AC
  Administered 2022-03-04: 100 mg via INTRAMUSCULAR
  Filled 2022-03-04: qty 2

## 2022-03-04 MED ORDER — THIAMINE HCL 100 MG PO TABS
100.0000 mg | ORAL_TABLET | Freq: Every day | ORAL | Status: DC
  Filled 2022-03-04: qty 1

## 2022-03-04 MED ORDER — ESCITALOPRAM OXALATE 10 MG PO TABS
10.0000 mg | ORAL_TABLET | Freq: Every day | ORAL | Status: DC
  Administered 2022-03-05 – 2022-03-07 (×3): 10 mg via ORAL
  Filled 2022-03-04 (×5): qty 1

## 2022-03-04 NOTE — ED Notes (Signed)
Attempted report x1. 

## 2022-03-04 NOTE — [facility] Suicide Risk Assessment (Signed)
[facility] INPATIENT:  Family/Significant Other Suicide Prevention Education ? ?Suicide Prevention Education:  ?Education Completed; CSW completed SPE with [name] Markham Jordan, girlfriend [phone] has been identified by the patient as the family member/significant other with whom the patient will be residing, and identified as the person(s) who will aid the patient in the event of a mental health crisis (suicidal ideations/suicide attempt).  With written consent from the patient, the family member/significant other has been provided the following suicide prevention education, prior to the and/or following the discharge of the patient. ? ?The suicide prevention education provided includes the following: ?Suicide risk factors ?Suicide prevention and interventions ?National Suicide Hotline telephone number ?[facility] assessment telephone number ?[facility] Emergency Assistance 911 ?[place] and/or Residential Mobile Crisis Unit telephone number ? ?Request made of family/significant other to: ?Remove weapons (e.g., guns, rifles, knives), all items previously/currently identified as safety concern.   ?Remove drugs/medications (over-the-counter, prescriptions, illicit drugs), all items previously/currently identified as a safety concern. ? ?The family member/significant other verbalizes understanding of the suicide prevention education information provided.  The family member/significant other agrees to remove the items of safety concern listed above. ? ?CSW spoke with Forrest Moron and she stated that [facility] is holding a bed for him and she would prefer a bed to bed transfer to another inpatient facility. [name] also stated that there are no guns in the home at this time.  ? ?Veva Holes, MSW, LCSW-A ?03/04/2022, 3:31 PM ?

## 2022-03-04 NOTE — ED Notes (Signed)
Pt left w/ [facility] to [facility]. Pt was agreeable to transfer. 1 valuable envelope and 1 pt belongings bag given to [employer] for transfer . No belongings remain w/ security office or in lockers. IVC paperwork original and copies given to [employer].  ?

## 2022-03-04 NOTE — H&P (Addendum)
Psychiatric Admission Assessment Adult  Patient Identification: Keith Miller MRN:  [id number] Date of Evaluation:  03/04/2022 Chief Complaint:  MDD (major depressive disorder), recurrent severe, without psychosis (HCC) [F33.2] Principal Diagnosis: MDD (major depressive disorder), recurrent severe, without psychosis (HCC) Diagnosis:  Principal Problem:   MDD (major depressive disorder), recurrent severe, without psychosis (HCC) Active Problems:   Alcohol withdrawal (HCC)   Alcohol use disorder, severe, dependence (HCC)  History of Present Illness:  Keith Miller is a 51 yo patient w/ PPH of MDD and EtoH use disorder ([facility]) who [facility] presenting intoxicated and per EMR endorsing depression. Per EMR patient presented to the ED with IVC in place w/ family reporting that patient had been endorsing SI w/ plan and sending out text saying "I love you very much."  Based on review of shadow chart it appears that patient was not IVC'd by family/ GPD but instead by ED physicians.   On assessment patient is Aox4. Patient reports that he recalls that he went to the store and bought alcohol and proceeded to drink "3 1/2 pints of Vodka" on Saturday. Patient reports that [facility] until yesterday he had been sober. Patient reports that he is not really sure he had a trigger. Patient reports that he did have a friend come into town, but the friend had actually discouraged EtoH. Patient reports he has also been feeling more stressed lately as he has a pending DWI case on 3/8 and his GF and her son live in a different household, despite having lived together for years. Patient reports that the change in households has been difficult, but his GF is supportive of him. Patient reports that he has not been very compliant with his Naltrexone or Lexapro since discharge. Patient reports he wants to go to rehab.  Patient reports that since his  last discharge he has not been sleeping well as he stays up worrying about his legal matters and finances. Patient endorses significant feelings of guilt related to how his addiction impacts the people around him. Patient also endorses decreased appetite. Patient denies anhedonia, change in energy, concentration or SI. Patient reports that he thinks his mood has been ok since discharge. On assessment today patient denies SI, HI, and AVH. Patient does not endorse any symptoms of paranoia.   Patient does not endorse any hx of symptoms concerning for hx of manic or hypomanic episodes.   Patient endorses that he mostly finds himself anxious in certain situations. Patient denies hx of panic attacks but endorses during assessment "I feel like I may have one right now."   Patient denies any symptoms concerning for PTSD.  Associated Signs/Symptoms: Depression Symptoms:  insomnia, feelings of worthlessness/guilt, anxiety, disturbed sleep, weight loss, decreased appetite, Duration of Depression Symptoms: Greater than two weeks  (Hypo) Manic Symptoms:   Denies Anxiety Symptoms:  Excessive Worry, Psychotic Symptoms:   Denies PTSD Symptoms: NA Total Time spent with patient: 1 hour  Past Psychiatric History:  [facility] for MDD and Etoh Use disorder Meds: Lexapro 20mg  , Naltrexone 50mg , Trazodone 50mg   Is the patient at risk to self? Yes.    Has the patient been a risk to self in the past 6 months? No.  Has the patient been a risk to self within the distant past? No.  Is the patient a risk to others? No.  Has the patient been a risk to others in the past 6  months? Yes.    Has the patient been a risk to others within the distant past? Yes.     Alcohol Screening: 1. How often do you have a drink containing alcohol?: 4 or more times a week 2. How many drinks containing alcohol do you have on a typical day when you are drinking?: 5 or 6 3. How often do you have six or more drinks on one  occasion?: Weekly AUDIT-C Score: 9 4. How often during the last year have you found that you were not able to stop drinking once you had started?: Weekly 5. How often during the last year have you failed to do what was normally expected from you because of drinking?: Weekly 6. How often during the last year have you needed a first drink in the morning to get yourself going after a heavy drinking session?: Less than monthly 7. How often during the last year have you had a feeling of guilt of remorse after drinking?: Weekly 8. How often during the last year have you been unable to remember what happened the night before because you had been drinking?: Weekly 9. Have you or someone else been injured as a result of your drinking?: No 10. Has a relative or friend or a doctor or another health worker been concerned about your drinking or suggested you cut down?: Yes, during the last year Alcohol Use Disorder Identification Test Final Score (AUDIT): 26 Alcohol Brief Interventions/Follow-up: Alcohol education/Brief advice  Substance Abuse History in the last 12 months:  Yes.   Hx of Rehab: [facility]- [facility] Sobriety after this rehab was 4.5years Consequences of Substance Abuse: Medical Consequences:  12/2021 hospitalized for Etoh withdrawal, non complicated was sent home after inpatient on Librium taper Legal Consequences:  2 DWI's, court date for 2nd on 03/07/2022  Previous Psychotropic Medications: Yes  Psychological Evaluations: No   Past Medical History:  Past Medical History:  Diagnosis Date   ETOH abuse     Past Surgical History:  Procedure Laterality Date   ANKLE SURGERY     KNEE SURGERY     Family History: History reviewed. No pertinent family history.  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 [employer], played soccer, has been a Teacher, adult education - Never married but has been in long term relationship and helped raise partner's son -Denies access to guns -Heterosexual, never married, no biologic children -Christian faith -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  Ethanol     Status: Abnormal  Collection Time: 03/03/22 12:36 PM  Result Value Ref Range   Alcohol, Ethyl (B) 457 (HH) <10 mg/dL    Comment: CRITICAL RESULT CALLED TO, READ BACK BY AND VERIFIED WITH: L.MEEKS,RN 03/03/2022 AT 1317 A.HUGHES (NOTE) Lowest detectable limit for serum alcohol is 10 mg/dL.  For medical purposes only. Performed at [facility], [address], [place], [place] [address]   Acetaminophen level     Status: Abnormal   Collection Time: 03/03/22 12:36 PM  Result Value Ref Range   Acetaminophen (Tylenol), Serum <10 (L) 10 - 30 ug/mL    Comment: (NOTE) Therapeutic concentrations vary significantly. A range of 10-30 ug/mL  may be an effective concentration for many patients. However, some  are best treated at concentrations outside of this range. Acetaminophen concentrations >150 ug/mL at 4 hours after ingestion  and >50 ug/mL at 12 hours after ingestion are often associated with  toxic reactions.  Performed  at [facility], [address] [place], [place] [address]   Salicylate level     Status: Abnormal   Collection Time: 03/03/22 12:36 PM  Result Value Ref Range   Salicylate Lvl <7.0 (L) 7.0 - 30.0 mg/dL    Comment: Performed at [facility], [address] [place], [place] [address]  Rapid urine drug screen (hospital performed)     Status: Abnormal   Collection Time: 03/03/22  1:40 PM  Result Value Ref Range   Opiates NONE DETECTED NONE DETECTED   Cocaine NONE DETECTED NONE DETECTED   Benzodiazepines POSITIVE (A) NONE DETECTED   Amphetamines NONE DETECTED NONE DETECTED   Tetrahydrocannabinol NONE DETECTED NONE DETECTED   Barbiturates NONE DETECTED NONE DETECTED    Comment: (NOTE) DRUG SCREEN FOR MEDICAL PURPOSES ONLY.  IF CONFIRMATION IS NEEDED FOR ANY PURPOSE, NOTIFY LAB WITHIN 5 DAYS.  LOWEST DETECTABLE LIMITS FOR URINE DRUG SCREEN Drug Class                     Cutoff (ng/mL) Amphetamine and metabolites    1000 Barbiturate and metabolites    200 Benzodiazepine                 200 Tricyclics and metabolites     300 Opiates and metabolites        300 Cocaine and metabolites        300 THC                            50 Performed at [facility], [address] [place], [place] [address]   Comprehensive metabolic panel     Status: Abnormal   Collection Time: 03/03/22  1:45 PM  Result Value Ref Range   Sodium 145 135 - 145 mmol/L   Potassium 3.6 3.5 - 5.1 mmol/L   Chloride 111 98 - 111 mmol/L   CO2 24 22 - 32 mmol/L   Glucose, Bld 100 (H) 70 - 99 mg/dL    Comment: Glucose reference range applies only to samples taken after fasting for at least 8 hours.   BUN 5 (L) 6 - 20 mg/dL   Creatinine, Ser 2.95 0.61 - 1.24 mg/dL   Calcium 7.5 (L) 8.9 - 10.3 mg/dL   Total Protein 5.5 (L) 6.5 - 8.1 g/dL   Albumin 3.5 3.5 - 5.0 g/dL   AST 35 15 - 41 U/L   ALT 21 0 - 44 U/L   Alkaline  Phosphatase 32 (L) 38 - 126 U/L   Total Bilirubin 0.5 0.3 - 1.2 mg/dL   GFR,  Estimated >70 >17 mL/min    Comment: (NOTE) Calculated using the CKD-EPI Creatinine Equation (2021)    Anion gap 10 5 - 15    Comment: Performed at [facility], [address], [place], [place] [address]  CBC     Status: Abnormal   Collection Time: 03/03/22  1:45 PM  Result Value Ref Range   WBC 3.8 (L) 4.0 - 10.5 K/uL   RBC 4.51 4.22 - 5.81 MIL/uL   Hemoglobin 14.6 13.0 - 17.0 g/dL   HCT 30.0 92.3 - 30.0 %   MCV 96.5 80.0 - 100.0 fL   MCH 32.4 26.0 - 34.0 pg   MCHC 33.6 30.0 - 36.0 g/dL   RDW 76.2 26.3 - 33.5 %   Platelets 151 150 - 400 K/uL   nRBC 0.0 0.0 - 0.2 %    Comment: Performed at [facility], [address], [place], [place] [address]  Resp Panel by RT-PCR (Flu A&B, Covid) Nasopharyngeal Swab     Status: None   Collection Time: 03/03/22  2:00 PM   Specimen: Nasopharyngeal Swab; Nasopharyngeal(NP) swabs in vial transport medium  Result Value Ref Range   SARS Coronavirus 2 by RT PCR NEGATIVE NEGATIVE    Comment: (NOTE) SARS-CoV-2 target nucleic acids are NOT DETECTED.  The SARS-CoV-2 RNA is generally detectable in upper respiratory specimens during the acute phase of infection. The lowest concentration of SARS-CoV-2 viral copies this assay can detect is 138 copies/mL. A negative result does not preclude SARS-Cov-2 infection and should not be used as the sole basis for treatment or other patient management decisions. A negative result may occur with  improper specimen collection/handling, submission of specimen other than nasopharyngeal swab, presence of viral mutation(s) within the areas targeted by this assay, and inadequate number of viral copies(<138 copies/mL). A negative result must be combined with clinical observations, patient history, and epidemiological information. The expected result is Negative.  Fact Sheet for Patients:  BloggerCourse.com  Fact Sheet for Healthcare Providers:   SeriousBroker.it  This test is no t yet approved or cleared by the [place] FDA and  has been authorized for detection and/or diagnosis of SARS-CoV-2 by FDA under an Emergency Use Authorization (EUA). This EUA will remain  in effect (meaning this test can be used) for the duration of the COVID-19 declaration under Section 564(b)(1) of the Act, 21 U.S.C.section 360bbb-3(b)(1), unless the authorization is terminated  or revoked sooner.       Influenza A by PCR NEGATIVE NEGATIVE   Influenza B by PCR NEGATIVE NEGATIVE    Comment: (NOTE) The Xpert Xpress SARS-CoV-2/FLU/RSV plus assay is intended as an aid in the diagnosis of influenza from Nasopharyngeal swab specimens and should not be used as a sole basis for treatment. Nasal washings and aspirates are unacceptable for Xpert Xpress SARS-CoV-2/FLU/RSV testing.  Fact Sheet for Patients: BloggerCourse.com  Fact Sheet for Healthcare Providers: SeriousBroker.it  This test is not yet approved or cleared by the [place] FDA and has been authorized for detection and/or diagnosis of SARS-CoV-2 by FDA under an Emergency Use Authorization (EUA). This EUA will remain in effect (meaning this test can be used) for the duration of the COVID-19 declaration under Section 564(b)(1) of the Act, 21 U.S.C. section 360bbb-3(b)(1), unless the authorization is terminated or revoked.  Performed at [facility], [address] [place],  [place] [address]     Blood Alcohol level:  Lab Results  Component Value Date   ETH 457 (HH) 03/03/2022   ETH 341 (HH) 02/28/2022    Metabolic Disorder Labs:  Lab Results  Component Value Date   HGBA1C 4.6 (L) 02/14/2022   MPG 85.32 02/14/2022   No results found for: PROLACTIN Lab Results  Component Value Date   CHOL 142 02/14/2022   TRIG 114 02/14/2022   HDL 61 02/14/2022   CHOLHDL 2.3 02/14/2022   VLDL 23  02/14/2022   LDLCALC 58 02/14/2022    Current Medications: Current Facility-Administered Medications  Medication Dose Route Frequency Provider Last Rate Last Admin   acetaminophen (TYLENOL) tablet 650 mg  650 mg Oral Q6H PRN Ajibola, Ene A, NP   650 mg at 03/04/22 1302   alum & mag hydroxide-simeth (MAALOX/MYLANTA) 200-200-20 MG/5ML suspension 30 mL  30 mL Oral Q4H PRN Ajibola, Ene A, NP       [START ON 03/05/2022] escitalopram (LEXAPRO) tablet 10 mg  10 mg Oral Daily McQuilla, Gerlean Ren B, MD       feeding supplement (ENSURE ENLIVE / ENSURE PLUS) liquid 237 mL  237 mL Oral BID BM Eliseo Gum B, MD   237 mL at 03/04/22 1612   gabapentin (NEURONTIN) capsule 100 mg  100 mg Oral TID Eliseo Gum B, MD   100 mg at 03/04/22 1613   hydrOXYzine (ATARAX) tablet 25 mg  25 mg Oral Q6H PRN Eliseo Gum B, MD   25 mg at 03/04/22 1302   hydrOXYzine (ATARAX) tablet 25 mg  25 mg Oral Q6H PRN Eliseo Gum B, MD       loperamide (IMODIUM) capsule 2-4 mg  2-4 mg Oral PRN Eliseo Gum B, MD       loperamide (IMODIUM) capsule 2-4 mg  2-4 mg Oral PRN Bobbye Morton, MD       LORazepam (ATIVAN) tablet 1 mg  1 mg Oral Q6H PRN Bobbye Morton, MD       LORazepam (ATIVAN) tablet 1 mg  1 mg Oral QID Eliseo Gum B, MD   1 mg at 03/04/22 1352   Followed by   Melene Muller ON 03/05/2022] LORazepam (ATIVAN) tablet 1 mg  1 mg Oral TID Bobbye Morton, MD       Followed by   Melene Muller ON 03/06/2022] LORazepam (ATIVAN) tablet 1 mg  1 mg Oral BID Eliseo Gum B, MD       Followed by   Melene Muller ON 03/07/2022] LORazepam (ATIVAN) tablet 1 mg  1 mg Oral Daily McQuilla, Jai B, MD       magnesium hydroxide (MILK OF MAGNESIA) suspension 30 mL  30 mL Oral Daily PRN Ajibola, Ene A, NP       multivitamin with minerals tablet 1 tablet  1 tablet Oral Daily Eliseo Gum B, MD       ondansetron (ZOFRAN-ODT) disintegrating tablet 4 mg  4 mg Oral Q6H PRN Eliseo Gum B, MD       ondansetron (ZOFRAN-ODT) disintegrating tablet 4 mg  4 mg Oral Q6H PRN  Bobbye Morton, MD       [START ON 03/05/2022] thiamine tablet 100 mg  100 mg Oral Daily Eliseo Gum B, MD       traZODone (DESYREL) tablet 50 mg  50 mg Oral QHS PRN Ajibola, Ene A, NP       PTA Medications: Medications Prior to Admission  Medication Sig Dispense Refill Last Dose   escitalopram (  LEXAPRO) 20 MG tablet Take 1 tablet (20 mg total) by mouth daily. 30 tablet 0    naltrexone (DEPADE) 50 MG tablet Take 1 tablet (50 mg total) by mouth daily. 30 tablet 0    traZODone (DESYREL) 50 MG tablet Take 1 tablet (50 mg total) by mouth at bedtime as needed for sleep. 30 tablet 0     Musculoskeletal: Strength & Muscle Tone: within normal limits Gait & Station: normal Patient leans: N/A   Psychiatric Specialty Exam:  Presentation  General Appearance: -- (wearing appropriate attire, but visibly with full body  tremor, eyes erythematous, picking at skin on legs, restless in chair)  Eye Contact:Fair  Speech:Slow (trying to speak over obvious tremor)  Speech Volume:Normal  Handedness:Right   Mood and Affect  Mood:-- ("fine")  Affect:Restricted   Thought Process  Thought Processes: Coherent but ruminative about desire for discharge  Past Diagnosis of Schizophrenia or Psychoactive disorder: No  Descriptions of Associations:Intact  Orientation:Full (Time, Place and Person)  Thought Content: Denies SI, HI, AVH, paranoia or delusions and is not grossly responding to internal or external stimuli on exam  Hallucinations:Hallucinations: None  Ideas of Reference:None  Suicidal Thoughts: Concern by family about expressed SI prior to admission but denies current SI, intent or plan and verbally contracts for safety on the unit  Homicidal Thoughts:Homicidal Thoughts: No   Sensorium  Memory: Fair  Judgment:Impaired  Insight:Shallow   Executive Functions  Concentration:Fair  Attention Span:Fair  Recall: Fis[employer]Fair  Fund of Knowledge:Fair  Language: Fair   Psychomotor  Activity  Psychomotor Activity: Frequently shifting in his chair, picking at his skin, and restless on exam   Assets  Assets:Communication Skills; Resilience; Housing; Social Support; Desire for Improvement  Physical Exam Constitutional:      Comments: Obvious full body tremor, restless  HENT:     Head: Normocephalic and atraumatic.  Pulmonary:     Effort: Pulmonary effort is normal.  Neurological:     Mental Status: He is alert and oriented to person, place, and time.   Review of Systems  Constitutional:  Negative for chills, diaphoresis and fever.  Respiratory:  Negative for shortness of breath.   Cardiovascular:  Negative for chest pain.  Gastrointestinal:  Negative for abdominal pain, constipation, diarrhea, nausea and vomiting.  Musculoskeletal:  Negative for myalgias.  Neurological:  Negative for dizziness and headaches.  Psychiatric/Behavioral:  Negative for depression, hallucinations and suicidal ideas.   Blood pressure (!) 154/88, pulse (!) 102, temperature 97.9 F (36.6 C), temperature source Oral, resp. rate 18, height 6' (1.829 m), weight 94 kg, SpO2 99 %. Body mass index is 28.1 kg/m.  Treatment Plan Summary: Daily contact with patient to assess and evaluate symptoms and progress in treatment and Medication management  Based on review of shadow chart it appears that patient was not IVC'd by family/ GPD but instead by ED physicians.   Keith AmberScott Shevchenko is a 51 yo male with PPH of MDD and Etoh Use disorder, who presented with elevated EtoH level of agitated behavior. Patient reports he has been sober and compliant with his medication's however this is unlikely as he has a 02/28/2022 ED visit with elevated EtoH level, from which he eloped and did not receive further care. Patient's behavior during this 02/28/2022 ED visit is concerning for poor insight and judgement impacting his medical care.  Based on this patient has been drinking more than on one occasion since his d'c  01/2022. On assessment today patient denied belief that he was currently  in withdrawal, despite obvious being in withdrawal. During past hospitalization patient was placed on Scheduled Ativan taper, and appears he will need it this time as well. Patient will also benefit from Gabapentin to help with his cravings, anxiety, and decrease chance of seizures.    Alcohol Intoxication Hx of Etoh use disorder - Repeat BAL this PM - Repeat Lipase - Hold Naltrexone at this time - Start CIWA with Ativan 1mg  for CIWA >10 - Start Scheduled Ativan taper - Start Gabapentin 100mg  TID - Given reluctance for continued admission and concern for self-care deficits and potential SI prior to admission, will continue IVC - can sign in for voluntary treatment at a later date if he agrees to care  Hx of MDD - Restart Lexapro at 10mg , can titrate back to 20mg  as tolerated  Labs Reviewed:  EtOH: 457, Acetaminophen lvl: (-), Salicyalte lvl: (-),  UDS(+) BZDs, CMP: Ca 2+ 7.5/ T protein 5.5, CBC: WBC 3.8, Lipase: 66  EKG: QTC 403  Physician Treatment Plan for Primary Diagnosis: MDD (major depressive disorder), recurrent severe, without psychosis (HCC) Long Term Goal(s): Improvement in symptoms so as ready for discharge  Short Term Goals: Ability to identify changes in lifestyle to reduce recurrence of condition will improve, Ability to verbalize feelings will improve, Ability to demonstrate self-control will improve, Compliance with prescribed medications will improve, and Ability to identify triggers associated with substance abuse/mental health issues will improve  Physician Treatment Plan for Secondary Diagnosis: Principal Problem:   MDD (major depressive disorder), recurrent severe, without psychosis (HCC) Active Problems:   Alcohol withdrawal (HCC)   Alcohol use disorder, severe, dependence (HCC)  Long Term Goal(s): Improvement in symptoms so as ready for discharge  Short Term Goals: Ability to identify and  develop effective coping behaviors will improve, Compliance with prescribed medications will improve, and Ability to identify triggers associated with substance abuse/mental health issues will improve  I certify that inpatient services furnished can reasonably be expected to improve the patient's condition.    PGY-2 Eliseo Gum, MD 3/5/20234:36 PM

## 2022-03-04 NOTE — [facility] Group Notes (Signed)
[facility] Group Notes:  (Nursing/MHT/Case Management/Adjunct) ? ?Date:  03/04/2022  ?Time:  8:27 PM ? ?Type of Therapy:   Wrap-up  ? ?Participation Level:  Active ? ?Participation Quality:  Appropriate and Attentive ? ?Affect:  Appropriate ? ?Cognitive:  Alert and Appropriate ? ?Insight:  Appropriate, Good, and Improving ? ?Engagement in Group:  Developing/Improving ? ?Modes of Intervention:  Discussion ? ?Summary of Progress/Problems: PT attended group and had a positive attitude. PT said he is working on getting his feet beneath him and is trying to become more familiar with what is going on. ? ?Maxine Glenn ?03/04/2022, 8:27 PM ?

## 2022-03-04 NOTE — Tx Team (Signed)
Initial Treatment Plan ?03/04/2022 ?12:51 PM ?Keith Miller ?[id number] ? ? ? ?PATIENT STRESSORS: ?Financial difficulties   ?Marital or family conflict   ?Occupational concerns   ?Substance abuse   ? ? ?PATIENT STRENGTHS: ?Capable of independent living  ?Motivation for treatment/growth  ?Supportive family/friends  ?Work skills  ? ? ?PATIENT IDENTIFIED PROBLEMS: ?Substance abuse- alcohol  ?anxiety  ?No job/finances   ?Girlfriend and her son moved out   ?DUI court date (on the 6th)  ?  ?  ?  ?  ?  ? ?DISCHARGE CRITERIA:  ?Improved stabilization in mood, thinking, and/or behavior ?Motivation to continue treatment in a less acute level of care ?Verbal commitment to aftercare and medication compliance ?Withdrawal symptoms are absent or subacute and managed without 24-hour nursing intervention ? ?PRELIMINARY DISCHARGE PLAN: ?Attend 12-step recovery group ?Outpatient therapy ? ?PATIENT/FAMILY INVOLVEMENT: ?This treatment plan has been presented to and reviewed with the patient, [name].  The patient has been given the opportunity to ask questions and make suggestions. ? ?Sofie Hartigan, RN ?03/04/2022, 12:51 PM ?

## 2022-03-04 NOTE — ED Notes (Signed)
Attempted report to [facility] x 2. Advised to "call back at 10" ?

## 2022-03-04 NOTE — ED Notes (Signed)
Breakfast tray delivered to pt on bedside table. Pt verbalizes no further needs and began eating tray. In NAD.  ?

## 2022-03-04 NOTE — ED Notes (Signed)
Called for transport of pt to [facility], eta "[facility]" ?

## 2022-03-04 NOTE — Progress Notes (Signed)
Admission note: ?Pt was here at [facility] a month ago and has been here multiple times. Presented to the [facility] with a 457 BAL. Pt stated that his stressors was losing his job, his girlfriend and her son moving out into her own house "we are working it out," and a court date on the 6th for a DWI. Pt states he drinks a couple of pints of vodka a day this past week but stated that on the normal, he would go a couple weeks or drink 4-6 a week. RN looked at results review and pt has had multiple BALs over 200. Pt seemed to be guarded and not go into a lot of detail. Pt asked for ativan twice once he was on the unit. Pt stated that he was just here and that he had a [facility] IOP appointment last week but missed it and his next one is on Monday, the 6th. His goal is to stop drinking. Consents signed, handbook detailing the patient's rights, responsibilities, and visitor guidelines provided. Skin/belongings search completed and patient oriented to unit. Patient stable at this time. Patient given the opportunity to express concerns and ask questions. Patient given toiletries. Will continue to monitor.   ? 03/04/22 1115  ?Psych Admission Type (Psych Patients Only)  ?Admission Status Involuntary  ?Psychosocial Assessment  ?Patient Complaints Appetite decrease;Loneliness;Insomnia;Anxiety  ?Eye Contact Brief  ?Facial Expression Anxious  ?Affect Anxious;Appropriate to circumstance  ?Speech Logical/coherent  ?Interaction Assertive;Guarded  ?Motor Activity Tremors  ?Appearance/Hygiene Disheveled  ?Behavior Characteristics Cooperative;Appropriate to situation;Anxious  ?Mood Anxious;Sad;Pleasant  ?Aggressive Behavior  ?Effect No apparent injury  ?Thought Process  ?Coherency Circumstantial  ?Content Confabulation  ?Delusions None reported or observed  ?Perception WDL  ?Hallucination None reported or observed  ?Judgment Poor  ?Confusion None  ?Danger to Self  ?Current suicidal ideation? Denies  ?Danger to Others  ?Danger to Others None reported  or observed  ? ? ?

## 2022-03-04 NOTE — Progress Notes (Signed)
?   03/04/22 2302  ?Psych Admission Type (Psych Patients Only)  ?Admission Status Involuntary  ?Psychosocial Assessment  ?Patient Complaints Anxiety;Substance abuse  ?Eye Contact Brief  ?Facial Expression Anxious  ?Affect Appropriate to circumstance  ?Speech Logical/coherent  ?Interaction Assertive  ?Motor Activity Slow  ?Appearance/Hygiene Improved  ?Behavior Characteristics Appropriate to situation  ?Mood Anxious;Pleasant  ?Thought Process  ?Coherency Circumstantial  ?Content WDL  ?Delusions None reported or observed  ?Perception WDL  ?Hallucination None reported or observed  ?Judgment Impaired  ?Confusion None  ?Danger to Self  ?Current suicidal ideation? Denies  ?Danger to Others  ?Danger to Others None reported or observed  ? ? ?

## 2022-03-04 NOTE — ED Notes (Signed)
TTS in process 

## 2022-03-04 NOTE — ED Notes (Signed)
Spoke with [facility] re: challenges giving report.  ?

## 2022-03-04 NOTE — Group Note (Signed)
?  [facility] LCSW Group Therapy Note ? ?Date/Time:  03/04/2022 10:00AM-11:00AM ? ?Type of Therapy and Topic:  Group Therapy:  Self-Care after Hospitalization ? ?Participation Level:  Did Not Attend  ? ?Description of Group ?This process group involved patients discussing how they plan to take care of themselves in a better manner when they get home from the hospital.  The group started with patients listing one healthy and one unhealthy way they took care of themselves prior to hospitalization.  A discussion ensued about the differences in healthy and unhealthy coping skills.  Group members shared ideas about making changes when they return home so that they can stay well and in recovery.  The white board was used to list ideas so that patients can continue to see these ideas throughout the day. ? ?Therapeutic Goals ?Patient will identify and describe one healthy and one unhealthy coping technique used prior to hospitalization ?Patient will participate in generating ideas about healthy self-care options when they return to the community ?Patients will be supportive of one another and receive said support from others ?Patient will identify one healthy self-care activity to add to his/her post-hospitalization life that can help in recovery ? ?Summary of Patient Progress:  The patient was invited to group. Patient did not attend. ? ? ?Therapeutic Modalities ?Brief Solution-Focused Therapy ?Motivational Interviewing ?Psychoeducation ? ? ? ?Read Drivers, LCSWA ?03/04/2022  12:46 PM   ? ?

## 2022-03-04 NOTE — [facility] Counselor (Signed)
Adult Comprehensive Assessment ?  ?Patient ID: Keith Miller, male   DOB: 27-Feb-1971, 51 y.o.   MRN: [id number] ?  ?Information Source: ?Information source: Patient ?  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 ?Financial / Lack of resources (include bankruptcy): No income ?Housing / Lack of housing: Denies - potentially will put house on market) ?Physical health (include injuries & life threatening diseases): Denies ?Social relationships: Girlfriend has bought another house and moved into it without him. ?Substance abuse: Pt reports that he got a DUI Feb 10th  He was here at [facility] on 2/15 for admission and planned to go to [facility], but they did not accept him. ?Bereavement / Loss: Denies ?  ?Living/Environment/Situation:  ?Living Arrangements: Alone ?Living conditions (as described by patient or guardian): Pt reports that he has his own home but that his girlfriend just bought her own and he would like to move in with her post-rehab ?Who else lives in the home?: alone ?How long has patient lived in current situation?:  2 weeks ?What is atmosphere in current home: temporary ?  ?Family History:  ?Long term relationship, how long?: 16 years ?What types of issues is patient dealing with in the relationship?: pts long term girlfriend wants him to get treatment before their relationship can resume ?Are you sexually active?: Yes ?What is your sexual orientation?: heterosexual ?Does patient have children?: Yes ?How many children?: 1 ?How is patient's relationship  with their children?: Pt has one step-son via his girlfriend and reports that they have a good relationship ?  ?Childhood History:  ?By whom was/is the patient raised?: Both parents ?Additional childhood history information: Pt reports that he grew up in [place] ?Description of patient's relationship with caregiver when they were a child: "amazing" ?Patient's description of current relationship with people who raised him/her: Pt reports that his mother is deceased and that he is the primary caretaker of his father who has health related issues due to having Polio at a young age ?How were you disciplined when you got in trouble as a child/adolescent?: "It switched between mom and dad. She was 0-100 and he was more even and they would come together to do it." ?Does patient have siblings?: Yes ?Number of Siblings: 2 (sisters) ?Description of patient's current relationship with siblings: Pt reports that he has an "excellent" relationship ?Did patient suffer any verbal/emotional/physical/sexual abuse as a child?: No ?Did patient suffer from severe childhood neglect?: No ?Has patient ever been sexually abused/assaulted/raped as an adolescent or adult?: No ?Was the patient ever a victim of a crime or a disaster?: No ?Witnessed domestic violence?: No ?Has patient been affected by domestic violence as an adult?: No ?  ?Education:  ?Highest grade of school patient has completed: Bachelor's Degree ?Currently a student?: No ?Learning disability?: No ?  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  ?Financial Resources:   ?Psychologist, prison and probation services, Income from spouse ?Does patient have a representative payee or guardian?: No ?  ?Alcohol/Substance  Abuse:   ?What has been your use of drugs/alcohol within the last 12 months?: Pt reports prior to relapsing he had 4 years of sobriety. Pt reports that for 6 months he was only drinking casually on the weekends. Pt reports that over the last 6 months he has began to binge more where he will go through periods where he drinks daily and then not drink for a week or more at a time. Pt states that when he does drink it is "all over the place"  Since he left the hospital he binge-drank on Friday and Saturday. ?If attempted suicide, did drugs/alcohol play a role in this?: No ?Alcohol/Substance Abuse Treatment Hx: Past Tx, Inpatient, Past Tx, Outpatient ?If yes, describe treatment: Pt reports completing inpatient treatment at [facility] in 2017 and then completing their IOP program following that ?Has alcohol/substance abuse ever caused legal problems?: Yes (Pt got a DUI on Feb 09 2022) ?  ?Social Support System:   ?Patient's Community Support System: Good ?Describe Community Support System: "girlfriend, son, dad, sisters and 3-4 friends" ?Type of faith/religion: Denies ?  ?Leisure/Recreation:   ?Do You Have Hobbies?: Yes ?Leisure and Hobbies: "excercising and attending athletics...esp ones involving my son. He is a pretty good soccer and football player." ?  ?Strengths/Needs:   ?What is the patient's perception of their strengths?: "My ability to relate to others and communicate ?  ?Discharge Plan:   ?Currently receiving community mental health services: No (did meet his individual counselor last week and was supposed to start ?Patient states concerns and preferences for aftercare planning are:  Patient wants to go to rehab - he is looking for a 30-day program. ?Does patient have access to transportation?: Yes (family/girlfriend) ?Does patient have financial barriers related to discharge medications?: No (private insurance and family support) ?Plan for living situation after discharge: Pt plans to go to substance use  treatment upon discharge ?Will patient be returning to same living situation after discharge?: No ?  ?Summary/Recommendations:  Patient is a 51yo male who is admitted for detox from alcohol, having recently been hospitalized for the same reason.  His BAL at presentation to the ED was 457.  He got a DUI recently and has an upcoming court date this week, needs to obtain a continuance.  He has given his sister his Power of Attorney and hopes she can take care of it, but also was informed a letter about being in the hospital can be provided.  The patient started the [facility] program this week with an appointment with his individual counselor; however, he got sick and did not make it to his starting date for the group sessions.  He is now asking to go to rehab for 30 days then step down from there to a SAIOP.  He had wanted to go to [facility] at his last discharge but was apparently declined from there.  Primary stressors are reported to be the DUI, loss of his job, his long-time girlfriend and her son moving out of his house into their own home, and being caretaker for his elderly father.  During this hospital stay, the patient would benefit from crisis stabilization, milieu participation, group therapy, psychoeducation, medication management, and discharge planning.  At discharge it is recommended that he adhere to the established aftercare plan.  ?  ?  ?  ? ?

## 2022-03-04 NOTE — BH Assessment (Signed)
Comprehensive Clinical Assessment (CCA) Note  03/04/2022 Keith Miller [id number]  DISPOSITION: Gave clinical report to Keith AsperEne Ajibola, NP who determined Pt meets criteria for inpatient psychiatric treatment. Binnie RailJoAnn Miller, [facility], confirmed bed availability. Pt is accepted to the service of Dr. Mason Miller, bed 301-2, and can be transferred after 0800. Notified Keith SouLogan Joldersma, PA-C and Keith ShellingShorbyshawron Davies, RN of acceptance via secure message.   The patient demonstrates the following risk factors for suicide: Chronic risk factors for suicide include: psychiatric disorder of major depressive disorder and substance use disorder. Acute risk factors for suicide include: unemployment and loss (financial, interpersonal, professional). Protective factors for this patient include: positive social support and responsibility to others (children, family). Considering these factors, the overall suicide risk at this point appears to be moderate. Patient is not appropriate for outpatient follow up.  Flowsheet Row ED [facility] from [facility]2022 [facility] from 02/19/2022 in [facility] OUTPATIENT THERAPY [place]  C-SSRS RISK CATEGORY Error: Q3, 4, or 5 should not be populated when Q2 is No Error: Q3, 4, or 5 should not be populated when Q2 is No Error: Q3, 4, or 5 should not be populated when Q2 is No      Pt is a 51 year old male who [facility] after being found intoxicated and unresponsive. Pt was petitioned for involuntary commitment by his friend, Keith Miller [phone]. Affidavit and petition states: "Respondent has been diagnosed with depression and anxiety. Not sure if taking prescribed medications. Respondent is not sleeping well, not eating, and not taking care of his personal hygiene. Respondent sent text messages to friends stating "things  are not good. I love you very much but this has to be it" "I want to end my life" "I love you guys, sorry I have to do this." Also told someone that he has a good plan in place and has been calling people he loves and his dad. Drinking non-stop until passes out. Was found on front porch of his dad's non-responsive and was [facility]."  [facility] a blood alcohol level of 457. He says he cannot remember what happened prior to coming to the ED. [facility]20[facility] and then [facility]. Pt says he returned to drinking alcohol approximately one week ago. He says he estimates drinking 2 pints of vodka daily. He acknowledges feeling depressed and anxious. He says he has poor sleep, averaging 4-5 hours per night and reports he has not been eating regularly. He denies current suicidal ideation or history of suicide attempts. He denies current homicidal ideation or history of violence. He denies psychotic symptoms. He denies use of substances other than alcohol and reports a history of binge drinking.  Pt identifies several stressors. He says he is unemployed, has debt, and financial stress. He says he has been charged with a DWI and has a court date 03/09/2022. He says he has good support including two sisters, his father, and friends. He denies history of abuse. He denies access to firearms. In [facility], [facility].  Pt is dressed in hospital scrubs, alert and oriented x4. Pt speaks in a clear tone, at moderate volume and normal pace. Motor behavior appears normal. Eye contact is good. Pt's mood is depressed and anxious, affect is  congruent with mood. Thought process is coherent and relevant. There is no indication Pt is currently responding to internal stimuli or experiencing delusional thought content. Pt was calm and cooperative throughout assessment.   Chief Complaint:  Chief  Complaint  Patient presents with   AMS   Visit Diagnosis:  F33.2 Major depressive disorder, Recurrent episode, Severe F10.20 Alcohol use disorder, Severe   CCA Screening, Triage and Referral (STR)  Patient Reported Information How did you hear about [place]? Family/Friend  What Is the Reason for Your Visit/Call Today? Pt has diagnosis of major depressive disorder and alcohol use disorder. He was discharged from [facility] on 02/17/2022 and participating in CDIOP. He reports resuming alcohol use one week ago. Pt was found intoxicated and unresponsive. A friend petitioned for involuntary commitment stating Pt is not eating, sleeping well, or caring for hygiene. Friend also reports Pt sent text messages saying he was going to end his life. Pt's blood alcohol level was 457.  How Long Has This Been Causing You Problems? > than 6 months  What Do You Feel Would Help You the Most Today? Alcohol or Drug Use Treatment; Treatment for Depression or other mood problem   Have You Recently Had Any Thoughts About Hurting Yourself? Yes  Are You Planning to Commit Suicide/Harm Yourself At This time? No   Have you Recently Had Thoughts About Hurting Someone Keith Miller? No  Are You Planning to Harm Someone at This Time? No  Explanation: No data recorded  Have You Used Any Alcohol or Drugs in the Past 24 Hours? Yes  How Long Ago Did You Use Drugs or Alcohol? No data recorded What Did You Use and How Much? Pt reports drinking 2.5 pints of vodka prior to being admitted to [facility].   Do You Currently Have a Therapist/Psychiatrist? Yes  Name of Therapist/Psychiatrist: Pt participating [facility]   Have You Been Recently Discharged From Any Office Practice or Programs? Yes  Explanation of Discharge From Practice/Program: Discharged from [facility] 02/17/2022     CCA Screening Triage Referral Assessment Type of Contact: Tele-Assessment  Telemedicine Service Delivery: Telemedicine  service delivery: This service was provided via telemedicine using a 2-way, interactive audio and video technology  Is this Initial or Reassessment? Initial Assessment  Date Telepsych consult ordered in [facility]:  03/03/22  Time Telepsych consult ordered in [facility]:  1503  Location of Assessment: [facility] ED  Provider Location: [facility] Assessment Services   Collateral Involvement: FriendKandis Mannan Miller [phone]   Does Patient Have a Automotive engineer Guardian? No data recorded Name and Contact of Legal Guardian: No data recorded If Minor and Not Living with Parent(s), Who has Custody? NA  Is CPS involved or ever been involved? Never  Is APS involved or ever been involved? Never   Patient Determined To Be At Risk for Harm To Self or Others Based on Review of Patient Reported Information or Presenting Complaint? Yes, for Self-Harm  Method: No data recorded Availability of Means: No data recorded Intent: No data recorded Notification Required: No data recorded Additional Information for Danger to Others Potential: No data recorded Additional Comments for Danger to Others Potential: No data recorded Are There Guns or Other Weapons in Your Home? No data recorded Types of Guns/Weapons: No data recorded Are These Weapons Safely Secured?  No data recorded Who Could Verify You Are Able To Have These Secured: No data recorded Do You Have any Outstanding Charges, Pending Court Dates, Parole/Probation? No data recorded Contacted To Inform of Risk of Harm To Self or Others: No data recorded   Does Patient Present under Involuntary Commitment? Yes  IVC Papers Initial File Date: 03/03/22   [place] of Residence: [place]   Patient Currently Receiving the Following Services: CD--IOP (Intensive Chemical Dependency Program)   Determination of Need: Emergent (2 hours)   Options For Referral: Inpatient Hospitalization; Chemical Dependency Intensive  Outpatient Therapy (CDIOP)     CCA Biopsychosocial Patient Reported Schizophrenia/Schizoaffective Diagnosis in Past: No   Strengths: pt actively seeking help for alcohol use   Mental Health Symptoms Depression:   Difficulty Concentrating; Fatigue; Increase/decrease in appetite; Sleep (too much or little)   Duration of Depressive symptoms:  Duration of Depressive Symptoms: Greater than two weeks   Mania:   Change in energy/activity; Recklessness   Anxiety:    Worrying; Sleep; Irritability; Fatigue; Difficulty concentrating   Psychosis:   None   Duration of Psychotic symptoms:    Trauma:   None   Obsessions:   None   Compulsions:   None   Inattention:   None   Hyperactivity/Impulsivity:   None   Oppositional/Defiant Behaviors:   None   Emotional Irregularity:   None   Other Mood/Personality Symptoms:   None    Mental Status Exam Appearance and self-care  Stature:   Average   Weight:   Average weight   Clothing:   -- (Scrubs)   Grooming:   Neglected   Cosmetic use:   None   Posture/gait:   Normal   Motor activity:   Not Remarkable   Sensorium  Attention:   Normal   Concentration:   Normal   Orientation:   X5   Recall/memory:   Defective in Recent   Affect and Mood  Affect:   Appropriate   Mood:   Anxious; Depressed   Relating  Eye contact:   Normal   Facial expression:   Depressed   Attitude toward examiner:   Cooperative   Thought and Language  Speech flow:  Clear and Coherent   Thought content:   Appropriate to Mood and Circumstances   Preoccupation:   Guilt   Hallucinations:   None   Organization:  No data recorded  [employer] of Knowledge:   Good   Intelligence:   Average   Abstraction:   Normal   Judgement:   Fair   Dance movement psychotherapist:   Realistic   Insight:   Gaps   Decision Making:   Impulsive   Social Functioning  Social Maturity:   Impulsive;  Irresponsible   Social Judgement:   Heedless   Stress  Stressors:   Family conflict; Grief/losses; Housing; Armed forces operational officer; Work; Relationship; Financial   Coping Ability:   Overwhelmed; Exhausted   Skill Deficits:   Self-control   Supports:   Friends/Service system     Religion: Religion/Spirituality Are You A Religious Person?: No How Might This Affect Treatment?: NA  Leisure/Recreation: Leisure / Recreation Do You Have Hobbies?: Yes Leisure and Hobbies: "excercising and attending athletics...esp ones involving my son. He is a pretty good soccer and football player."  Exercise/Diet: Exercise/Diet Do You Exercise?: Yes How Many Times a Week Do You Exercise?: Daily Have You Gained or Lost A Significant Amount of Weight in the Past Six Months?: No Do You Follow a Special Diet?: No  Explanation of Sleeping Difficulties: insomnia most nights--hard to fall asleep and stay asleep. Averaging 4-5 hours sleep per night.   CCA Employment/Education Employment/Work Situation: Employment / Work Situation Employment Situation: Unemployed Patient's Job has Been Impacted by Current Illness: No Describe how Patient's Job has Been Impacted: pt recently lost his job Has Patient ever Been in Equities trader?: No  Education: Education Is Patient Currently Attending School?: No Did Theme park manager?: No Did You Have An Individualized Education Program (IIEP): No Did You Have Any Difficulty At [employer]?: No Patient's Education Has Been Impacted by Current Illness: No   CCA Family/Childhood History Family and Relationship History: Family history Marital status: Long term relationship Long term relationship, how long?: 16 years What types of issues is patient dealing with in the relationship?: pts long term girlfriend is in the process of moving out due to recently buying her own home Additional relationship information: pt is very close with girlfriends son--"my son" Does patient have  children?: Yes How many children?: 1 How is patient's relationship with their children?: Pt has one step-son via his girlfriend and reports that they have a good relationship  Childhood History:  Childhood History By whom was/is the patient raised?: Both parents Description of patient's current relationship with siblings: Pt reports that he has an "excellent" relationship Did patient suffer any verbal/emotional/physical/sexual abuse as a child?: No Did patient suffer from severe childhood neglect?: No Has patient ever been sexually abused/assaulted/raped as an adolescent or adult?: No Was the patient ever a victim of a crime or a disaster?: No Witnessed domestic violence?: No Has patient been affected by domestic violence as an adult?: No  Child/Adolescent Assessment:     CCA Substance Use Alcohol/Drug Use: Alcohol / Drug Use Pain Medications: SEE MAR Prescriptions: SEE MAR Over the Counter: SEE MAR History of alcohol / drug use?: Yes Longest period of sobriety (when/how long): 4 years; 2017 [facility] to [facility] and was sober from 2017-2021 Negative Consequences of Use: Legal, Personal relationships Withdrawal Symptoms: Sweats, Tachycardia, Irritability, Anorexia Substance #1 Name of Substance 1: Alcohol 1 - Age of First Use: High School; first drank age 43; started drinking regularly around age 70 1 - Amount (size/oz): Approximately 2 pints of vodka daily 1 - Frequency: Daily for the past week 1 - Duration: Ongoing 1 - Last Use / Amount: 03/03/2022 1 - Method of Aquiring: Store 1- Route of Use: Oral ingestion                       ASAM's:  Six Dimensions of Multidimensional Assessment  Dimension 1:  Acute Intoxication and/or Withdrawal Potential:   Dimension 1:  Description of individual's past and current experiences of substance use and withdrawal: Arrived with BAL=457  Dimension 2:  Biomedical Conditions and Complications:   Dimension 2:  Description of  patient's biomedical conditions and  complications: None  Dimension 3:  Emotional, Behavioral, or Cognitive Conditions and Complications:  Dimension 3:  Description of emotional, behavioral, or cognitive conditions and complications: Pt has diagnosis of major depressive disorder and sent text messages stating he was going to end his life  Dimension 4:  Readiness to Change:  Dimension 4:  Description of Readiness to Change criteria: Pt says he is motivated to stop drinking alcohol  Dimension 5:  Relapse, Continued use, or Continued Problem Potential:  Dimension 5:  Relapse, continued use, or continued problem potential critiera description: Pt has brief periods of sobriety  Dimension 6:  Recovery/Living  Environment:  Dimension 6:  Recovery/Iiving environment criteria description: His girlfriend does not drink and is "more than supportive" of the client's recovery. His girlfriend is moving so client will live alone until he sells his house.  ASAM Severity Score: ASAM's Severity Rating Score: 7  ASAM Recommended Level of Treatment: ASAM Recommended Level of Treatment: Level II Intensive Outpatient Treatment   Substance use Disorder (SUD) Substance Use Disorder (SUD)  Checklist Symptoms of Substance Use: Continued use despite having a persistent/recurrent physical/psychological problem caused/exacerbated by use, Continued use despite persistent or recurrent social, interpersonal problems, caused or exacerbated by use, Persistent desire or unsuccessful efforts to cut down or control use, Recurrent use that results in a failure to fulfill major role obligations (work, school, home), Evidence of tolerance, Evidence of withdrawal (Comment), Large amounts of time spent to obtain, use or recover from the substance(s), Repeated use in physically hazardous situations, Substance(s) often taken in larger amounts or over longer times than was intended, Presence of craving or strong urge to use  Recommendations for  Services/Supports/Treatments: Recommendations for Services/Supports/Treatments Recommendations For Services/Supports/Treatments: CD-IOP Intensive Chemical Dependency Program, Medication Management, Inpatient Hospitalization  Discharge Disposition: Discharge Disposition Medical Exam completed: Yes Disposition of Patient: Admit  DSM5 Diagnoses: Patient Active Problem List   Diagnosis Date Noted   Alcohol use disorder, severe, dependence (HCC) 02/14/2022   MDD (major depressive disorder), recurrent episode, severe (HCC) 02/13/2022   Substance induced mood disorder (HCC)    Suicidal ideation    Alcohol withdrawal syndrome with complication, with unspecified complication (HCC) 01/02/2022   Alcohol withdrawal (HCC) 01/02/2022     Referrals to Alternative Service(s): Referred to Alternative Service(s):   Place:   Date:   Time:    Referred to Alternative Service(s):   Place:   Date:   Time:    Referred to Alternative Service(s):   Place:   Date:   Time:    Referred to Alternative Service(s):   Place:   Date:   Time:     Pamalee LeydenWarrick Jr, Chayton Murata Ellis, [facility]

## 2022-03-04 NOTE — BHH Suicide Risk Assessment (Signed)
Suicide Risk Assessment ? ?Admission Assessment    ?[facility] Admission Suicide Risk Assessment ? ? ?Nursing information obtained from:    ?Demographic factors:  Male, Unemployed, Living alone ?Current Mental Status:  NA ?Loss Factors:  Financial problems / change in socioeconomic status ?Historical Factors:  NA ?Risk Reduction Factors:  Positive social support ? ?Total Time spent with patient: 1 hour ?Principal Problem: MDD (major depressive disorder), recurrent severe, without psychosis (HCC) ?Diagnosis:  Principal Problem: ?  MDD (major depressive disorder), recurrent severe, without psychosis (HCC) ? ?Subjective Data: Keith Miller is a 51 yo patient w/ PPH of MDD and EtoH use disorder (recent hospitalization at [facility] 01/2022) who presented to [facility] after transfer from [facility] presenting intoxicated and per EMR endorsing depression.Patient placed under IVC in ED.On assessment patient is Aox4. Patient reports that he recalls that he went to the store and bought alcohol and proceeded to drink "3 1/2 pints of Vodka" on Saturday.Patient reports that since his last discharge he has not been sleeping well as he stays up worrying about his legal matters and finances. Patient endorses significant feelings of guilt related to how his addiction impacts the people around him. Patient also endorses decreased appetite. Patient denies anhedonia, change in energy, concentration or SI. Patient reports that he thinks his mood has been ok since discharge. On assessment today patient denies SI, HI, and AVH. Patient does not endorse any symptoms of paranoia. Patient reports he wants to go to rehab. ? ?Continued Clinical Symptoms:  ?Alcohol Use Disorder Identification Test Final Score (AUDIT): 26 ?The "Alcohol Use Disorders Identification Test", Guidelines for Use in Primary Care, Second Edition.  World Science writer [facility]). ?Score between 0-7:  no or low risk or alcohol related problems. ?Score between 8-15:  moderate risk of alcohol  related problems. ?Score between 16-19:  high risk of alcohol related problems. ?Score 20 or above:  warrants further diagnostic evaluation for alcohol dependence and treatment. ? ? ?CLINICAL FACTORS:  ? Alcohol/Substance Abuse/Dependencies ? ? ?Musculoskeletal: ?Strength & Muscle Tone: within normal limits ?Gait & Station: normal ?Patient leans: N/A ? ?Psychiatric Specialty Exam: ? ?Presentation  ?General Appearance: -- (wearing appropriate attire, but visibly with full body  tremor, eyes erythematous, picking at skin on legs, restless in chair) ? ?Eye Contact:Fair ? ?Speech:Slow (trying to speak over obvious tremor) ? ?Speech Volume:Normal ? ?Handedness:Right ? ? ?Mood and Affect  ?Mood:-- ("fine") ? ?Affect:Restricted ? ? ?Thought Process  ?Thought Processes:Coherent ? ?Descriptions of Associations:Intact ? ?Orientation:Full (Time, Place and Person) ? ?Thought Content:Logical ? ?History of Schizophrenia/Schizoaffective disorder:No ? ?Duration of Psychotic Symptoms:No data recorded ?Hallucinations:Hallucinations: None ? ?Ideas of Reference:None ? ?Suicidal Thoughts:Suicidal Thoughts: No ? ?Homicidal Thoughts:Homicidal Thoughts: No ? ? ?Sensorium  ?Memory:Immediate Good; Recent Good ? ?Judgment:Impaired ? ?Insight:Shallow ? ? ?Executive Functions  ?Concentration:Fair ? ?Attention Span:Fair ? ?Recall:Good ? ?Fund of Knowledge:Fair ? ?Language:Good ? ? ?Psychomotor Activity  ?Psychomotor Activity:Psychomotor Activity: Tremor ? ? ?Assets  ?Assets:Communication Skills; Resilience; Housing; Social Support; Desire for Improvement ? ? ?Sleep  ?Sleep:Sleep: Poor ? ? ? ?Physical Exam: ?Physical Exam ?HENT:  ?   Head: Normocephalic and atraumatic.  ?Pulmonary:  ?   Effort: Pulmonary effort is normal.  ?Skin: ?   General: Skin is dry.  ?Neurological:  ?   Mental Status: He is alert and oriented to person, place, and time.  ?   Comments: Tremor full body, but normal gait, restless ?  ? ?Review of Systems  ?Gastrointestinal:   Negative for abdominal pain.  ?Neurological:  Negative for headaches.  ?Psychiatric/Behavioral:  Negative for depression, hallucinations and suicidal ideas.   ?Blood pressure (!) 154/88, pulse (!) 102, temperature 97.9 ?F (36.6 ?C), temperature source Oral, resp. rate 18, height 6' (1.829 m), weight 94 kg, SpO2 99 %. Body mass index is 28.1 kg/m?. ? ? ?COGNITIVE FEATURES THAT CONTRIBUTE TO RISK:  ?None   ? ?SUICIDE RISK:  ? Moderate:  Frequent suicidal ideation with limited intensity, and duration, some specificity in terms of plans, no associated intent, good self-control, limited dysphoria/symptomatology, some risk factors present, and identifiable protective factors, including available and accessible social support. ? ?PLAN OF CARE:  He needs crisis stabilization, safety monitoring and medication management.  ?   ? ?I certify that inpatient services furnished can reasonably be expected to improve the patient's condition.  ? ?PGY-2 ?Bobbye Morton, MD ?03/04/2022, 2:03 PM ?

## 2022-03-04 NOTE — ED Notes (Signed)
All IVC paperwork faxed to [facility] [phone]. Confirmation receipt paper received through fax system. ?

## 2022-03-04 NOTE — ED Notes (Signed)
Pt up at nursing station making first phone call of day. Pt spoke to father stating "you're my family you've gotta be able to do something to take this back". Pt informed this RN that father was going to "cancel the IVC". Educated pt that because IVC was upheld by our physicians, his father would not be able to rescind the IVC and that would have to be done by our psychiatric care team once at [facility]. Pt advised he will be transferred there this AM ?

## 2022-03-04 NOTE — ED Notes (Signed)
Report given to Valerie RN

## 2022-03-04 NOTE — ED Notes (Signed)
Ambulated to BR w/ steady gait. No distress, ?

## 2022-03-05 ENCOUNTER — Encounter (HOSPITAL_COMMUNITY): Payer: Self-pay

## 2022-03-05 ENCOUNTER — Encounter (HOSPITAL_COMMUNITY): Payer: 59

## 2022-03-05 DIAGNOSIS — F332 Major depressive disorder, recurrent severe without psychotic features: Secondary | ICD-10-CM

## 2022-03-05 DIAGNOSIS — F339 Major depressive disorder, recurrent, unspecified: Secondary | ICD-10-CM | POA: Diagnosis present

## 2022-03-05 NOTE — Plan of Care (Signed)
Nurse discussed coping skills with patient.  

## 2022-03-05 NOTE — Progress Notes (Signed)
NUTRITION ASSESSMENT ? ?Pt identified as at risk on the Malnutrition Screen Tool ? ?INTERVENTION: ?1. Supplements: Ensure Plus High Protein po BID, each supplement provides 350 kcal and 20 grams of protein.  ?2. Continue vitamin supplements (MVI, thiamine) ?3. Consider folic acid ? ?NUTRITION DIAGNOSIS: ?Unintentional weight loss related to sub-optimal intake as evidenced by pt report.  ? ?Goal: ?Pt to meet >/= 90% of their estimated nutrition needs. ? ?Monitor:  ?PO intake ? ?Assessment:  ?Pt admitted for alcohol withdrawal and depression. Pt drinks vodka daily. Reports good appetite.  ?Pt is receiving daily MVI and thiamine. ?Ensure supplements have been ordered. ? ?Height: ?Ht Readings from Last 1 Encounters:  ?03/04/22 6' (1.829 m)  ? ? ?Weight: ?Wt Readings from Last 1 Encounters:  ?03/04/22 94 kg  ? ? ?Weight Hx: ?Wt Readings from Last 10 Encounters:  ?03/04/22 94 kg  ?02/28/22 95.3 kg  ?02/13/22 93.4 kg  ?02/12/22 90.7 kg  ?02/10/22 90.7 kg  ?01/21/22 95.3 kg  ?11/09/21 90.7 kg  ? ? ?BMI:  Body mass index is 28.1 kg/m?Marland Kitchen ?Pt meets criteria for overweight based on current BMI. ? ?Estimated Nutritional Needs: ?Kcal: 25-30 kcal/kg ?Protein: > 1 gram protein/kg ?Fluid: 1 ml/kcal ? ?Diet Order:  ?Diet Order   ? ?       ?  Diet regular Room service appropriate? Yes; Fluid consistency: Thin  Diet effective now       ?  ? ?  ?  ? ?  ? ?Pt is also offered choice of unit snacks mid-morning and mid-afternoon.  ?Pt is eating as desired.  ? ?Lab results and medications reviewed.  ? ?Clayton Bibles, MS, RD, LDN ?Inpatient Clinical Dietitian ?Contact information available via Amion ? ? ?

## 2022-03-05 NOTE — Progress Notes (Signed)
D:  Patient 's self inventory sheet, patient has fair sleep, sleep medication not helpful.  Good appetite, high energy level, good concentration.  Rated depression 4, hopeless 1, anxiety 2.  Denied withdrawals.  Denied SI.  Denied physical problems.  Goal is revise alcohol strategy.  Discuss with MDs and SWs.  Does have discharge plans. ?A:  Medications administered per MD orders.  Emotional support and encouragement given patient. ?R:  Denied SI and HI, contracts for safety.  Denied A/V hallucinations.  Safety maintained with 15 minute checks. ? ?

## 2022-03-05 NOTE — Group Note (Signed)
LCSW Group Therapy Note ? ? ?Group Date: 03/05/2022 ?Start Time: 1300 ?End Time: 1400 ? ?Type of Therapy and Topic: Group Therapy: Control ? ?Participation Level: Active ? ?Description of Group: ?In this group patients will discuss what is out of their control, what is somewhat in their control, and what is within their control.  They will be encouraged to explore what issues they can control and what issues are out of their control within their daily lives. They will be guided to discuss their thoughts, feelings, and behaviors related to these issues. The group will process together ways to better control things that are well within our own control and how to notice and accept the things that are not within our control. This group will be process-oriented, with patients participating in exploration of their own experiences as well as giving and receiving support and challenge from other group members.  During this group 2 worksheets will be provided to each patient to follow along and fill out.  ? ?Therapeutic Goals: ?1. Patient will identify what is within their control and what is not within their control. ?2. Patient will identify their thoughts and feelings about having control over their own lives. ?3. Patient will identify their thoughts and feelings about not having control over everything in their lives.Marland Kitchen ?4. Patient will identify ways that they can have more control over their own lives. ?5. Patient will identify areas were they can allow others to help them or provide assistance. ? ?Summary of Patient Progress:  The Pt attended the group and remained there the entire time.  The Pt accepted the worksheets that were provided and followed along throughout the group.  The Pt participated in the group discussion and was appropriate with their peers.  The Pt shared openly and demonstrated understanding of the topics being discussed.  ? ?Aram Beecham, LCSWA ?03/05/2022  2:54 PM   ? ?

## 2022-03-05 NOTE — BHH Group Notes (Signed)
Adult Psychoeducational Group Note ? ?Date:  03/05/2022 ?Time:  9:03 AM ? ?Group Topic/Focus:  ?Goals Group:   The focus of this group is to help patients establish daily goals to achieve during treatment and discuss how the patient can incorporate goal setting into their daily lives to aide in recovery. ? ?Participation Level:  Active ? ?Participation Quality:  Appropriate ? ?Affect:  Appropriate ? ?Cognitive:  Appropriate ? ?Insight: Appropriate ? ?Engagement in Group:  Engaged ? ?Modes of Intervention:  Discussion ? ?Additional Comments:  Pt's goal is to talk with Dr. And Social Worker about his discharge plan. ? ?Donell Beers ?03/05/2022, 9:03 AM ?

## 2022-03-05 NOTE — B[facility] Group Notes (Signed)
PsychoEducational Group Note- ?The patients were educated on positive reframing techniques, and ways that negative thinking can impact mental health. A poem was read by the Dali Lama in regards to thought patterns. The patients were then asked to participate in relaxation and mindfulness techniques and ended group with deep breathing practice. The patient attended and was appropriate.  ?

## 2022-03-05 NOTE — Progress Notes (Signed)
?   03/05/22 1945  ?Psych Admission Type (Psych Patients Only)  ?Admission Status Involuntary  ?Psychosocial Assessment  ?Patient Complaints None  ?Eye Contact Fair  ?Facial Expression Anxious  ?Affect Appropriate to circumstance  ?Speech Logical/coherent  ?Interaction Assertive  ?Motor Activity Slow  ?Appearance/Hygiene Unremarkable  ?Behavior Characteristics Cooperative;Appropriate to situation  ?Mood Anxious;Pleasant  ?Thought Process  ?Coherency WDL;Circumstantial  ?Content WDL  ?Delusions None reported or observed  ?Perception WDL  ?Hallucination None reported or observed  ?Judgment Impaired  ?Confusion None  ?Danger to Self  ?Current suicidal ideation? Denies  ?Danger to Others  ?Danger to Others None reported or observed  ? ?Pt states that he has no complaints today. Denies SI, HI, AVH and pain. Pt denies anxiety and depression. "I feel good today. We went outside for an hour and I got a good workout." Pt's plan is to go to a 30-day program for detox after discharge. "My girlfriend is a travel Marine scientist and she leaves for [employer]. I'm gonna go home and get a few things together and then find a program. There's one in [place] and one in [place] that I talked to the social worker about." Pt states that he wants to stop drinking. "My son is 28 and about to go to college. He'll be playing sports. I want to be there for him. I don't want to let him down." Pt denies any withdrawal symptoms from alcohol. ?

## 2022-03-05 NOTE — BHH Counselor (Signed)
CSW spoke with patient about concerns on the unit. Patient reports that he has been worried about some of the other patients and their behaviors.  He was encouraged to come and talk with staff if any additional concerns come up.  CSW able to process some of the behaviors and he ws able to calm down.   ? ?Patient discussed wanting to go to a residential treatment but that he wanted to go home first and go to [place] on Monday for a bed at [facility].  CSW informed patient of girlfriend, [name], wanting him to go bed to bed to treatment. Patient discussed needing to tie up loose ends here before going to treatment including caring for his father and an upcoming court date.  CSW encouraged him to call girlfriend so everyone can get on the same discharge plan.  Patient agreed.  ? ? ?Sharissa Brierley, LCSW, LCAS ?Clincal Social Worker  ?[facility] ? ?

## 2022-03-05 NOTE — BH IP Treatment Plan (Signed)
Interdisciplinary Treatment and Diagnostic Plan Update  03/05/2022 Time of Session: 2:05pm Keith Miller MRN: [id number]  Principal Diagnosis: Alcohol use disorder, severe, dependence (HCC)  Secondary Diagnoses: Principal Problem:   Alcohol use disorder, severe, dependence (HCC) Active Problems:   Alcohol withdrawal (HCC)   MDD (major depressive disorder), recurrent episode (HCC)   Current Medications:  Current Facility-Administered Medications  Medication Dose Route Frequency Provider Last Rate Last Admin   acetaminophen (TYLENOL) tablet 650 mg  650 mg Oral Q6H PRN Ajibola, Ene A, NP   650 mg at 03/04/22 1302   alum & mag hydroxide-simeth (MAALOX/MYLANTA) 200-200-20 MG/5ML suspension 30 mL  30 mL Oral Q4H PRN Ajibola, Ene A, NP       escitalopram (LEXAPRO) tablet 10 mg  10 mg Oral Daily Eliseo Gum B, MD   10 mg at 03/05/22 0749   feeding supplement (ENSURE ENLIVE / ENSURE PLUS) liquid 237 mL  237 mL Oral BID BM McQuilla, Gerlean Ren B, MD   237 mL at 03/05/22 1100   gabapentin (NEURONTIN) capsule 100 mg  100 mg Oral TID Eliseo Gum B, MD   100 mg at 03/05/22 1215   hydrOXYzine (ATARAX) tablet 25 mg  25 mg Oral Q6H PRN Eliseo Gum B, MD   25 mg at 03/04/22 1302   hydrOXYzine (ATARAX) tablet 25 mg  25 mg Oral Q6H PRN Eliseo Gum B, MD   25 mg at 03/04/22 2109   loperamide (IMODIUM) capsule 2-4 mg  2-4 mg Oral PRN Bobbye Morton, MD       loperamide (IMODIUM) capsule 2-4 mg  2-4 mg Oral PRN Bobbye Morton, MD       LORazepam (ATIVAN) tablet 1 mg  1 mg Oral Q6H PRN Bobbye Morton, MD       LORazepam (ATIVAN) tablet 1 mg  1 mg Oral TID Eliseo Gum B, MD   1 mg at 03/05/22 1215   Followed by   Melene Muller ON 03/06/2022] LORazepam (ATIVAN) tablet 1 mg  1 mg Oral BID Eliseo Gum B, MD       Followed by   Melene Muller ON 03/07/2022] LORazepam (ATIVAN) tablet 1 mg  1 mg Oral Daily McQuilla, Jai B, MD       magnesium hydroxide (MILK OF MAGNESIA) suspension 30 mL  30 mL Oral Daily PRN Ajibola, Ene A,  NP       multivitamin with minerals tablet 1 tablet  1 tablet Oral Daily Eliseo Gum B, MD   1 tablet at 03/05/22 0745   ondansetron (ZOFRAN-ODT) disintegrating tablet 4 mg  4 mg Oral Q6H PRN Eliseo Gum B, MD       ondansetron (ZOFRAN-ODT) disintegrating tablet 4 mg  4 mg Oral Q6H PRN Eliseo Gum B, MD       thiamine tablet 100 mg  100 mg Oral Daily Eliseo Gum B, MD   100 mg at 03/05/22 0746   traZODone (DESYREL) tablet 50 mg  50 mg Oral QHS PRN Ajibola, Ene A, NP   50 mg at 03/04/22 2109   PTA Medications: Medications Prior to Admission  Medication Sig Dispense Refill Last Dose   escitalopram (LEXAPRO) 20 MG tablet Take 1 tablet (20 mg total) by mouth daily. 30 tablet 0    naltrexone (DEPADE) 50 MG tablet Take 1 tablet (50 mg total) by mouth daily. 30 tablet 0    traZODone (DESYREL) 50 MG tablet Take 1 tablet (50 mg total) by mouth at bedtime as needed for sleep. 30  tablet 0     Patient Stressors: Financial difficulties   Marital or family conflict   Occupational concerns   Substance abuse    Patient Strengths: Capable of independent living  Motivation for treatment/growth  Supportive family/friends  Work skills   Treatment Modalities: Medication Management, Group therapy, Case management,  1 to 1 session with clinician, Psychoeducation, Recreational therapy.   Physician Treatment Plan for Primary Diagnosis: Alcohol use disorder, severe, dependence (HCC) Long Term Goal(s): Improvement in symptoms so as ready for discharge   Short Term Goals: Ability to identify and develop effective coping behaviors will improve Compliance with prescribed medications will improve Ability to identify triggers associated with substance abuse/mental health issues will improve Ability to identify changes in lifestyle to reduce recurrence of condition will improve Ability to verbalize feelings will improve Ability to demonstrate self-control will improve  Medication Management: Evaluate  patient's response, side effects, and tolerance of medication regimen.  Therapeutic Interventions: 1 to 1 sessions, Unit Group sessions and Medication administration.  Evaluation of Outcomes: Progressing  Physician Treatment Plan for Secondary Diagnosis: Principal Problem:   Alcohol use disorder, severe, dependence (HCC) Active Problems:   Alcohol withdrawal (HCC)   MDD (major depressive disorder), recurrent episode (HCC)  Long Term Goal(s): Improvement in symptoms so as ready for discharge   Short Term Goals: Ability to identify and develop effective coping behaviors will improve Compliance with prescribed medications will improve Ability to identify triggers associated with substance abuse/mental health issues will improve Ability to identify changes in lifestyle to reduce recurrence of condition will improve Ability to verbalize feelings will improve Ability to demonstrate self-control will improve     Medication Management: Evaluate patient's response, side effects, and tolerance of medication regimen.  Therapeutic Interventions: 1 to 1 sessions, Unit Group sessions and Medication administration.  Evaluation of Outcomes: Progressing   RN Treatment Plan for Primary Diagnosis: Alcohol use disorder, severe, dependence (HCC) Long Term Goal(s): Knowledge of disease and therapeutic regimen to maintain health will improve  Short Term Goals: Ability to remain free from injury will improve, Ability to verbalize frustration and anger appropriately will improve, Ability to demonstrate self-control, Ability to identify and develop effective coping behaviors will improve, and Compliance with prescribed medications will improve  Medication Management: RN will administer medications as ordered by provider, will assess and evaluate patient's response and provide education to patient for prescribed medication. RN will report any adverse and/or side effects to prescribing provider.  Therapeutic  Interventions: 1 on 1 counseling sessions, Psychoeducation, Medication administration, Evaluate responses to treatment, Monitor vital signs and CBGs as ordered, Perform/monitor CIWA, COWS, AIMS and Fall Risk screenings as ordered, Perform wound care treatments as ordered.  Evaluation of Outcomes: Progressing   LCSW Treatment Plan for Primary Diagnosis: Alcohol use disorder, severe, dependence (HCC) Long Term Goal(s): Safe transition to appropriate next level of care at discharge, Engage patient in therapeutic group addressing interpersonal concerns.  Short Term Goals: Engage patient in aftercare planning with referrals and resources, Increase social support, Increase ability to appropriately verbalize feelings, Facilitate patient progression through stages of change regarding substance use diagnoses and concerns, Identify triggers associated with mental health/substance abuse issues, and Increase skills for wellness and recovery  Therapeutic Interventions: Assess for all discharge needs, 1 to 1 time with Social worker, Explore available resources and support systems, Assess for adequacy in community support network, Educate family and significant other(s) on suicide prevention, Complete Psychosocial Assessment, Interpersonal group therapy.  Evaluation of Outcomes: Progressing   Progress  in Treatment: Attending groups: Yes. Participating in groups: Yes. Taking medication as prescribed: Yes. Toleration medication: Yes. Family/Significant other contact made: Yes, individual(s) contacted:  girlfriend Patient understands diagnosis: Yes. Discussing patient identified problems/goals with staff: Yes. Medical problems stabilized or resolved: Yes. Denies suicidal/homicidal ideation: Yes. Issues/concerns per patient self-inventory: No.   New problem(s) identified: No, Describe:  none  New Short Term/Long Term Goal(s): detox, medication management for mood stabilization; elimination of SI thoughts;  development of comprehensive mental wellness/sobriety plan  Patient Goals:  "To get out of here and go to rehab"  Discharge Plan or Barriers: Patient is interested in discharging to [facility] for residential substance use treatment  Reason for Continuation of Hospitalization: Medication stabilization Withdrawal symptoms  Estimated Length of Stay: 1-3 days   Scribe for Treatment Team: Otelia Santee, LCSW 03/05/2022 2:21 PM

## 2022-03-05 NOTE — Group Note (Signed)
Recreation Therapy Group Note ? ? ?Group Topic:Stress Management  ?Group Date: 03/05/2022 ?Start Time: 47 ?End Time: 0950 ?Facilitators: Victorino Sparrow, LRT,CTRS ?Location: [place] ? ? ?Goal Area(s) Addresses:  ?Patient will identify positive stress management techniques. ?Patient will identify benefits of using stress management post d/c. ? ?Group Description:  Meditation.  LRT played a meditation that focused on using your day to restore, forgive and show grace to yourself and others.  The meditation also focused on loving every part of yourself flaws and all.  Patients were to listen and follow along with the meditation as it played to fully engage in activity.        ? ? ?Affect/Mood: Appropriate ?  ?Participation Level: Active ?  ?Participation Quality: Independent ?  ?Behavior: Attentive  ?  ?Speech/Thought Process: Focused ?  ?Insight: Good ?  ?Judgement: Good ?  ?Modes of Intervention: Meditation ?  ?Patient Response to Interventions:  Attentive and Engaged ?  ?Education Outcome: ? Acknowledges education and In group clarification offered   ? ?Clinical Observations/Individualized Feedback: Pt attended and participated in group session.  ?  ? ?Plan: Continue to engage patient in RT group sessions 2-3x/week. ? ? ?Victorino Sparrow, LRT,CTRS ?03/05/2022 12:56 PM ?

## 2022-03-05 NOTE — Progress Notes (Signed)
[facility]  03/05/2022 12:28 PM Keith Miller  MRN:  [id number]  Chief Complaint: alcohol abuse, questionable SI  Reason for Admission:  Keith ConferScott H Miller is a 51 y.o. male with a history of MDD and alcohol use d/o, who was initially admitted for inpatient psychiatric hospitalization on 03/04/2022 for management of reported self-care deficits and SI in the context of alcohol relapse. The patient is currently on Hospital Day 1.   Chart Review from last 24 hours:  The patient's chart was reviewed and nursing notes were reviewed. The patient's case was discussed in multidisciplinary team meeting.  Per nursing he attended groups and had no acute safety or behavioral issues noted.  [facility] was compliant with scheduled medications and did receive Vistaril x2 for anxiety, Tylenol x1 for pain and trazodone x1 for sleep.  Information Obtained Today During Patient Interview: The patient was seen and evaluated on the unit. On assessment today the patient reports that he is feeling physically better today.  He states his sleep was restless overnight and he states he does feel somewhat fatigued today.  He denies any current signs of withdrawal or cravings.  He denies SI, HI, AVH or paranoia.  He states he does recall texting friends telling them he loved them but states that when he drinks he often gets "sappy like that."  He states he does not recall texting friends that he was suicidal nor telling them goodbye.  He confirms that his last drink was around 11 AM on Saturday, and we discussed again his elevated alcohol level on admission and concerns for potential withdrawal.  He is tolerating Ativan taper and is currently tolerating the start of Neurontin and restart of Lexapro without noted side-effects.  He states he does not currently feel depressed but does feel anxious in the context of psychosocial stressors.  He is very ruminative about his pending court date and states he feels he needs to  discharge to work on trying to get his house ready for sale and to work with his attorney about his court hearing.  He understands that family and his girlfriend want him to go door to door for a direct admit to a rehab facility, but he is unsure he wants to proceed with this plan.  He states he would prefer to go home and then go to rehab sometime next week.  We discussed the fact that his white blood cell count was low on admission and his lipase was elevated.  He physically states he is not having any nausea, vomiting, diarrhea, constipation, abdominal pain, chest pain or shortness of breath and reports good appetite.  Principal Problem: Alcohol use disorder, severe, dependence (HCC) Diagnosis: Principal Problem:   Alcohol use disorder, severe, dependence (HCC) Active Problems:   Alcohol withdrawal (HCC)   MDD (major depressive disorder), recurrent episode (HCC)  Total Time Spent in Direct Patient Care:  I personally spent 30 minutes on the unit in direct patient care. The direct patient care time included face-to-face time with the patient, reviewing the patient's chart, communicating with other professionals, and coordinating care. Greater than 50% of this time was spent in counseling or coordinating care with the patient regarding goals of hospitalization, psycho-education, and discharge planning needs.  Past Psychiatric History: see H&P  Past Medical History:  Past Medical History:  Diagnosis Date   ETOH abuse     Past Surgical History:  Procedure Laterality Date   ANKLE SURGERY     KNEE SURGERY  Family History: see H&P  Family Psychiatric  History: see H&P  Social History:  Social History   Substance and Sexual Activity  Alcohol Use Yes   Comment: 3-4 half pints of vodka QD     Social History   Substance and Sexual Activity  Drug Use Not Currently    Social History   Socioeconomic History   Marital status: Single    Spouse name: Not on file   Number of  children: Not on file   Years of education: Not on file   Highest education level: Not on file  Occupational History   Not on file  Tobacco Use   Smoking status: Never   Smokeless tobacco: Never  Vaping Use   Vaping Use: Never used  Substance and Sexual Activity   Alcohol use: Yes    Comment: 3-4 half pints of vodka QD   Drug use: Not Currently   Sexual activity: Yes    Birth control/protection: None  Other Topics Concern   Not on file  Social History Narrative   Not on file   Social Determinants of Health   Financial Resource Strain: Not on file  Food Insecurity: Not on file  Transportation Needs: Not on file  Physical Activity: Not on file  Stress: Not on file  Social Connections: Not on file    Sleep: Fair  Appetite:  Good  Current Medications: Current Facility-Administered Medications  Medication Dose Route Frequency Provider Last Rate Last Admin   acetaminophen (TYLENOL) tablet 650 mg  650 mg Oral Q6H PRN Ajibola, Ene A, NP   650 mg at 03/04/22 1302   alum & mag hydroxide-simeth (MAALOX/MYLANTA) 200-200-20 MG/5ML suspension 30 mL  30 mL Oral Q4H PRN Ajibola, Ene A, NP       escitalopram (LEXAPRO) tablet 10 mg  10 mg Oral Daily Eliseo Gum B, MD   10 mg at 03/05/22 0749   feeding supplement (ENSURE ENLIVE / ENSURE PLUS) liquid 237 mL  237 mL Oral BID BM Eliseo Gum B, MD   237 mL at 03/04/22 1612   gabapentin (NEURONTIN) capsule 100 mg  100 mg Oral TID Eliseo Gum B, MD   100 mg at 03/05/22 1215   hydrOXYzine (ATARAX) tablet 25 mg  25 mg Oral Q6H PRN Eliseo Gum B, MD   25 mg at 03/04/22 1302   hydrOXYzine (ATARAX) tablet 25 mg  25 mg Oral Q6H PRN Eliseo Gum B, MD   25 mg at 03/04/22 2109   loperamide (IMODIUM) capsule 2-4 mg  2-4 mg Oral PRN Bobbye Morton, MD       loperamide (IMODIUM) capsule 2-4 mg  2-4 mg Oral PRN Bobbye Morton, MD       LORazepam (ATIVAN) tablet 1 mg  1 mg Oral Q6H PRN Bobbye Morton, MD       LORazepam (ATIVAN) tablet 1 mg  1  mg Oral TID Eliseo Gum B, MD   1 mg at 03/05/22 1215   Followed by   Melene Muller ON 03/06/2022] LORazepam (ATIVAN) tablet 1 mg  1 mg Oral BID Eliseo Gum B, MD       Followed by   Melene Muller ON 03/07/2022] LORazepam (ATIVAN) tablet 1 mg  1 mg Oral Daily McQuilla, Jai B, MD       magnesium hydroxide (MILK OF MAGNESIA) suspension 30 mL  30 mL Oral Daily PRN Ajibola, Ene A, NP       multivitamin with minerals tablet 1 tablet  1 tablet  Oral Daily Eliseo Gum B, MD   1 tablet at 03/05/22 0745   ondansetron (ZOFRAN-ODT) disintegrating tablet 4 mg  4 mg Oral Q6H PRN Eliseo Gum B, MD       ondansetron (ZOFRAN-ODT) disintegrating tablet 4 mg  4 mg Oral Q6H PRN Eliseo Gum B, MD       thiamine tablet 100 mg  100 mg Oral Daily Eliseo Gum B, MD   100 mg at 03/05/22 0746   traZODone (DESYREL) tablet 50 mg  50 mg Oral QHS PRN Ajibola, Ene A, NP   50 mg at 03/04/22 2109    Lab Results:  Results for orders placed or performed during the hospital encounter of 03/04/22 (from the past 48 hour(s))  Ethanol     Status: None   Collection Time: 03/04/22  7:30 PM  Result Value Ref Range   Alcohol, Ethyl (B) <10 <10 mg/dL    Comment: (NOTE) Lowest detectable limit for serum alcohol is 10 mg/dL.  For medical purposes only. Performed at [facility], [address], [place], [place] [address]   Lipase, blood     Status: Abnormal   Collection Time: 03/04/22  7:30 PM  Result Value Ref Range   Lipase 55 (H) 11 - 51 U/L    Comment: Performed at [facility], [address], [place], [place] [address]  CBC with Differential/Platelet     Status: None   Collection Time: 03/04/22  7:30 PM  Result Value Ref Range   WBC 5.2 4.0 - 10.5 K/uL   RBC 4.51 4.22 - 5.81 MIL/uL   Hemoglobin 14.7 13.0 - 17.0 g/dL   HCT 68.6 16.8 - 37.2 %   MCV 94.2 80.0 - 100.0 fL   MCH 32.6 26.0 - 34.0 pg   MCHC 34.6 30.0 - 36.0 g/dL   RDW 90.2 11.1 - 55.2 %   Platelets 158 150 - 400 K/uL   nRBC  0.0 0.0 - 0.2 %   Neutrophils Relative % 73 %   Neutro Abs 3.8 1.7 - 7.7 K/uL   Lymphocytes Relative 17 %   Lymphs Abs 0.9 0.7 - 4.0 K/uL   Monocytes Relative 7 %   Monocytes Absolute 0.4 0.1 - 1.0 K/uL   Eosinophils Relative 2 %   Eosinophils Absolute 0.1 0.0 - 0.5 K/uL   Basophils Relative 1 %   Basophils Absolute 0.0 0.0 - 0.1 K/uL   Immature Granulocytes 0 %   Abs Immature Granulocytes 0.02 0.00 - 0.07 K/uL    Comment: Performed at [facility], [address] [place], [place] [address]    Blood Alcohol level:  Lab Results  Component Value Date   ETH <10 03/04/2022   ETH 457 (HH) 03/03/2022    Metabolic Disorder Labs: Lab Results  Component Value Date   HGBA1C 4.6 (L) 02/14/2022   MPG 85.32 02/14/2022   No results found for: PROLACTIN Lab Results  Component Value Date   CHOL 142 02/14/2022   TRIG 114 02/14/2022   HDL 61 02/14/2022   CHOLHDL 2.3 02/14/2022   VLDL 23 02/14/2022   LDLCALC 58 02/14/2022    Physical Findings: AIMS: Facial and Oral Movements Muscles of Facial Expression: None, normal Lips and Perioral Area: None, normal Jaw: None, normal Tongue: None, normal,Extremity Movements Upper (arms, wrists, hands, fingers): None, normal Lower (legs, knees, ankles, toes): None, normal, Trunk Movements Neck, shoulders, hips: None, normal, Overall Severity Severity of abnormal movements (highest score from questions above):  None, normal Incapacitation due to abnormal movements: None, normal Patient's awareness of abnormal movements (rate only patient's report): No Awareness, Dental Status Current problems with teeth and/or dentures?: No Does patient usually wear dentures?: No  CIWA:  CIWA-Ar Total: 4   Musculoskeletal: Strength & Muscle Tone: within normal limits Gait & Station: normal Patient leans: N/A  Psychiatric Specialty Exam:  Presentation  General Appearance: - casually dressed, fair hygiene  Eye Contact:Good  Speech:  normal rate and fluency  Speech Volume:Normal  Mood and Affect  Mood: described as "fine" - appears mildly anxious  Affect:anxious   Thought Process  Thought Processes:linear but ruminative at times about psychosocial stressors  Descriptions of Associations:Intact  Orientation:Full (Time, Place and Person)  Thought Content: Logical - denies AVH,paranoia, or delusions, denies SI or HI  Hallucinations:Hallucinations: None  Ideas of Reference:None  Suicidal Thoughts:Suicidal Thoughts: No  Homicidal Thoughts:Homicidal Thoughts: No   Sensorium  Memory:Fair  Judgment:Fair  Insight:Shallow   Executive Functions  Concentration:Fair  Attention Span:Fair  Recall: Fair  Fund of Knowledge:Good  Language:Good   Psychomotor Activity  Psychomotor Activity:Normal - no tremor noted   Assets  Assets:Communication Skills; Resilience; Housing; Social Support; Desire for Improvement   Sleep  6.5 hours  Physical Exam Vitals and nursing note reviewed.  HENT:     Head: Normocephalic.  Pulmonary:     Effort: Pulmonary effort is normal.  Abdominal:     General: Abdomen is flat.     Palpations: Abdomen is soft.     Tenderness: There is no abdominal tenderness. There is no guarding or rebound.  Neurological:     General: No focal deficit present.     Mental Status: He is alert.   Review of Systems  Respiratory:  Negative for shortness of breath.   Cardiovascular:  Negative for chest pain.  Gastrointestinal:  Negative for abdominal pain, constipation, diarrhea, nausea and vomiting.  Neurological:  Negative for headaches.  Blood pressure (!) 136/98, pulse (!) 111, temperature 97.7 F (36.5 C), resp. rate 17, height 6' (1.829 m), weight 94 kg, SpO2 99 %. Body mass index is 28.1 kg/m.   Treatment Plan Summary:  Diagnoses / Active Problems: MDD recurrent  by hx (r/o alcohol induce depressive d/o) Alcohol use d/o - severe  PLAN: Safety and Monitoring:  --  Involuntary admission to inpatient psychiatric unit for safety, stabilization and treatment  -- Daily contact with patient to assess and evaluate symptoms and progress in treatment  -- Patient's case to be discussed in multi-disciplinary team meeting  -- Observation Level : q15 minute checks  -- Vital signs:  q12 hours  -- Precautions: suicide, elopement, and assault  2. Psychiatric Diagnoses and Treatment:   MDD recurrent by hx (r/o alcohol induce depressive d/o) -- Continue Lexapro 10mg  daily -- Continue Neurontin 100mg  tid for anxiety and withdrawal    Alcohol use d/o - severe  -- Continue Ativan scheduled taper with additional Ativan 1 mg for CIWA scores greater than 10 with multivitamin and thiamine oral replacement  -- Continue Neurontin 100 mg 3 times daily for anxiety and potential withdrawal --patient encouraged to consider door-to-door transfer for residential rehab and social work looking into residential rehab options.  -- Counseled extensively on the need to abstain from alcohol use   3. Medical Issues Being Addressed:   Elevated lipase  -- Repeat lipase trending down to 55 and patient is currently asymptomatic- we will continue to monitor   Leukopenia - resolved  -- Repeat WBC 5.2  4. Discharge Planning:   -- Social work and case management to assist with discharge planning and identification of hospital follow-up needs prior to discharge  -- Estimated LOS: 5-7 days  -- Discharge Concerns: Need to establish a safety plan; Medication compliance and effectiveness  -- Discharge Goals: Return home with outpatient referrals for mental health follow-up including medication management/psychotherapy   Comer Locket, MD, FAPA 03/05/2022, 12:28 PM

## 2022-03-06 NOTE — Progress Notes (Signed)
[facility] MD Progress Note  03/06/2022 5:28 PM Keith Miller  MRN:  [id number]  Chief Complaint: alcohol abuse, questionable SI  Reason for Admission:  Keith Miller is a 51 y.o. male with a history of MDD and alcohol use d/o, who was initially admitted for inpatient psychiatric hospitalization on 03/04/2022 for management of reported self-care deficits and SI in the context of alcohol relapse. The patient is currently on Hospital Day 2.   Chart Review from last 24 hours:  The patient's chart was reviewed and nursing notes were reviewed. The patient's case was discussed in multidisciplinary team meeting.  Per nursing he attended groups and had no acute safety or behavioral issues noted.  Per [facility] he was compliant with scheduled medications and did receive Vistaril x1 for anxiety  Information Obtained Today During Patient Interview: The patient was seen and evaluated on the unit. On assessment today the patient reports that he is feeling "really well" overall. He feels that he is not currently having any withdrawal symptoms from alcohol. He talks about his 4.5 year period of sobriety, and then relapse. He has guilt about his relapse. He is aware that his GF, [name], wants him to go directly to rehab from the hospital but he does not want to do this, as he has several other obligations that he wants to attend to first. He denies current thoughts of harm to self or others and denies hallucinations. He feels that he is able to manage his medications on his own and follow up with contacting rehabs as well. He expresses gratitude to the staff for help. He plans to be discharged and feels that he will be ready  Principal Problem: Alcohol use disorder, severe, dependence (HCC) Diagnosis: Principal Problem:   Alcohol use disorder, severe, dependence (HCC) Active Problems:   Alcohol withdrawal (HCC)   MDD (major depressive disorder), recurrent episode (HCC)   Past Psychiatric History: see H&P  Past Medical  History:  Past Medical History:  Diagnosis Date   ETOH abuse     Past Surgical History:  Procedure Laterality Date   ANKLE SURGERY     KNEE SURGERY     Family History: see H&P  Family Psychiatric  History: see H&P  Social History:  Social History   Substance and Sexual Activity  Alcohol Use Yes   Comment: 3-4 half pints of vodka QD     Social History   Substance and Sexual Activity  Drug Use Not Currently    Social History   Socioeconomic History   Marital status: Single    Spouse name: Not on file   Number of children: Not on file   Years of education: Not on file   Highest education level: Not on file  Occupational History   Not on file  Tobacco Use   Smoking status: Never   Smokeless tobacco: Never  Vaping Use   Vaping Use: Never used  Substance and Sexual Activity   Alcohol use: Yes    Comment: 3-4 half pints of vodka QD   Drug use: Not Currently   Sexual activity: Yes    Birth control/protection: None  Other Topics Concern   Not on file  Social History Narrative   Not on file   Social Determinants of Health   Financial Resource Strain: Not on file  Food Insecurity: Not on file  Transportation Needs: Not on file  Physical Activity: Not on file  Stress: Not on file  Social Connections: Not on file  Sleep: Fair  Appetite:  Good  Current Medications: Current Facility-Administered Medications  Medication Dose Route Frequency Provider Last Rate Last Admin   acetaminophen (TYLENOL) tablet 650 mg  650 mg Oral Q6H PRN Ajibola, Ene A, NP   650 mg at 03/04/22 1302   alum & mag hydroxide-simeth (MAALOX/MYLANTA) 200-200-20 MG/5ML suspension 30 mL  30 mL Oral Q4H PRN Ajibola, Ene A, NP       escitalopram (LEXAPRO) tablet 10 mg  10 mg Oral Daily Eliseo GumMcQuilla, Jai B, MD   10 mg at 03/06/22 0816   feeding supplement (ENSURE ENLIVE / ENSURE PLUS) liquid 237 mL  237 mL Oral BID BM Eliseo GumMcQuilla, Jai B, MD   237 mL at 03/06/22 1600   gabapentin (NEURONTIN) capsule  100 mg  100 mg Oral TID Eliseo GumMcQuilla, Jai B, MD   100 mg at 03/06/22 1629   hydrOXYzine (ATARAX) tablet 25 mg  25 mg Oral Q6H PRN Eliseo GumMcQuilla, Jai B, MD   25 mg at 03/04/22 1302   hydrOXYzine (ATARAX) tablet 25 mg  25 mg Oral Q6H PRN Eliseo GumMcQuilla, Jai B, MD   25 mg at 03/05/22 2128   loperamide (IMODIUM) capsule 2-4 mg  2-4 mg Oral PRN Bobbye MortonMcQuilla, Jai B, MD       loperamide (IMODIUM) capsule 2-4 mg  2-4 mg Oral PRN Bobbye MortonMcQuilla, Jai B, MD       LORazepam (ATIVAN) tablet 1 mg  1 mg Oral Q6H PRN Bobbye MortonMcQuilla, Jai B, MD       [START ON 03/07/2022] LORazepam (ATIVAN) tablet 1 mg  1 mg Oral Daily McQuilla, Jai B, MD       magnesium hydroxide (MILK OF MAGNESIA) suspension 30 mL  30 mL Oral Daily PRN Ajibola, Ene A, NP       multivitamin with minerals tablet 1 tablet  1 tablet Oral Daily Eliseo GumMcQuilla, Jai B, MD   1 tablet at 03/06/22 0816   ondansetron (ZOFRAN-ODT) disintegrating tablet 4 mg  4 mg Oral Q6H PRN Eliseo GumMcQuilla, Jai B, MD       ondansetron (ZOFRAN-ODT) disintegrating tablet 4 mg  4 mg Oral Q6H PRN Eliseo GumMcQuilla, Jai B, MD       thiamine tablet 100 mg  100 mg Oral Daily Eliseo GumMcQuilla, Jai B, MD   100 mg at 03/06/22 0816   traZODone (DESYREL) tablet 50 mg  50 mg Oral QHS PRN Ajibola, Ene A, NP   50 mg at 03/05/22 2128    Lab Results:  Results for orders placed or performed during the hospital encounter of 03/04/22 (from the past 48 hour(s))  Ethanol     Status: None   Collection Time: 03/04/22  7:30 PM  Result Value Ref Range   Alcohol, Ethyl (B) <10 <10 mg/dL    Comment: (NOTE) Lowest detectable limit for serum alcohol is 10 mg/dL.  For medical purposes only. Performed at [facility], [address] [place], [place] [address]   Lipase, blood     Status: Abnormal   Collection Time: 03/04/22  7:30 PM  Result Value Ref Range   Lipase 55 (H) 11 - 51 U/L    Comment: Performed at [facility], [address] [place], [place] [address]  CBC with Differential/Platelet     Status: None    Collection Time: 03/04/22  7:30 PM  Result Value Ref Range   WBC 5.2 4.0 - 10.5 K/uL   RBC 4.51 4.22 - 5.81 MIL/uL   Hemoglobin 14.7 13.0 - 17.0 g/dL  HCT 42.5 39.0 - 52.0 %   MCV 94.2 80.0 - 100.0 fL   MCH 32.6 26.0 - 34.0 pg   MCHC 34.6 30.0 - 36.0 g/dL   RDW 97.5 30.0 - 51.1 %   Platelets 158 150 - 400 K/uL   nRBC 0.0 0.0 - 0.2 %   Neutrophils Relative % 73 %   Neutro Abs 3.8 1.7 - 7.7 K/uL   Lymphocytes Relative 17 %   Lymphs Abs 0.9 0.7 - 4.0 K/uL   Monocytes Relative 7 %   Monocytes Absolute 0.4 0.1 - 1.0 K/uL   Eosinophils Relative 2 %   Eosinophils Absolute 0.1 0.0 - 0.5 K/uL   Basophils Relative 1 %   Basophils Absolute 0.0 0.0 - 0.1 K/uL   Immature Granulocytes 0 %   Abs Immature Granulocytes 0.02 0.00 - 0.07 K/uL    Comment: Performed at [facility], [address] [place], [place] [address]    Blood Alcohol level:  Lab Results  Component Value Date   ETH <10 03/04/2022   ETH 457 (HH) 03/03/2022    Metabolic Disorder Labs: Lab Results  Component Value Date   HGBA1C 4.6 (L) 02/14/2022   MPG 85.32 02/14/2022   No results found for: PROLACTIN Lab Results  Component Value Date   CHOL 142 02/14/2022   TRIG 114 02/14/2022   HDL 61 02/14/2022   CHOLHDL 2.3 02/14/2022   VLDL 23 02/14/2022   LDLCALC 58 02/14/2022    Physical Findings: AIMS: Facial and Oral Movements Muscles of Facial Expression: None, normal Lips and Perioral Area: None, normal Jaw: None, normal Tongue: None, normal,Extremity Movements Upper (arms, wrists, hands, fingers): None, normal Lower (legs, knees, ankles, toes): None, normal, Trunk Movements Neck, shoulders, hips: None, normal, Overall Severity Severity of abnormal movements (highest score from questions above): None, normal Incapacitation due to abnormal movements: None, normal Patient's awareness of abnormal movements (rate only patient's report): No Awareness, Dental Status Current problems with teeth  and/or dentures?: No Does patient usually wear dentures?: No  CIWA:  CIWA-Ar Total: 1   Musculoskeletal: Strength & Muscle Tone: within normal limits Gait & Station: normal Patient leans: N/A  Psychiatric Specialty Exam:  Presentation  General Appearance: - casually dressed, fair hygiene  Eye Contact:Good  Speech: normal rate and fluency  Speech Volume:Normal  Mood and Affect  Mood: anxious  Affect:anxious   Thought Process  Thought Processes:linear but ruminative at times about psychosocial stressors  Descriptions of Associations:Intact  Orientation:Full (Time, Place and Person)  Thought Content: Logical - denies AVH,paranoia, or delusions, denies SI or HI  Hallucinations:No data recorded  Ideas of Reference:None  Suicidal Thoughts:No data recorded  Homicidal Thoughts:No data recorded   Sensorium  Memory:Fair  Judgment:Fair  Insight:Shallow   Executive Functions  Concentration:Fair  Attention Span:Fair  Recall: Fair  Fund of Knowledge:Good  Language:Good   Psychomotor Activity  Psychomotor Activity:Normal - no tremor noted   Assets  Assets:Communication Skills; Resilience; Housing; Social Support; Desire for Improvement   Sleep  6.5 hours  Physical Exam Vitals and nursing note reviewed.  HENT:     Head: Normocephalic.  Eyes:     Extraocular Movements: Extraocular movements intact.  Pulmonary:     Effort: Pulmonary effort is normal.  Abdominal:     Tenderness: There is no abdominal tenderness. There is no guarding or rebound.  Neurological:     General: No focal deficit present.     Mental Status: He is alert.   Review of  Systems  Respiratory:  Negative for cough and shortness of breath.   Cardiovascular:  Negative for chest pain.  Gastrointestinal:  Negative for constipation, diarrhea, nausea and vomiting.  Neurological:  Negative for headaches.  Blood pressure (!) 140/95, pulse (!) 113, temperature 98.1 F (36.7 C),  temperature source Oral, resp. rate 18, height 6' (1.829 m), weight 94 kg, SpO2 96 %. Body mass index is 28.1 kg/m.   Treatment Plan Summary:  Diagnoses / Active Problems: MDD recurrent  by hx (r/o alcohol induce depressive d/o) Alcohol use d/o - severe  PLAN: Safety and Monitoring:  -- Involuntary admission to inpatient psychiatric unit for safety, stabilization and treatment  -- Daily contact with patient to assess and evaluate symptoms and progress in treatment  -- Patient's case to be discussed in multi-disciplinary team meeting  -- Observation Level : q15 minute checks  -- Vital signs:  q12 hours  -- Precautions: suicide, elopement, and assault  2. Psychiatric Diagnoses and Treatment:   MDD recurrent by hx (r/o alcohol induce depressive d/o) -- Continue Lexapro 10mg  daily -- Continue Neurontin 100mg  tid for anxiety and withdrawal    Alcohol use d/o - severe  -- Continue Ativan scheduled taper with additional Ativan 1 mg for CIWA scores greater than 10 with multivitamin and thiamine oral replacement  -- Continue Neurontin 100 mg 3 times daily for anxiety and potential withdrawal --patient encouraged to consider door-to-door transfer for residential rehab and social work looking into residential rehab options.  -- Counseled extensively on the need to abstain from alcohol use   3. Medical Issues Being Addressed:   Elevated lipase  -- Repeat lipase trending down to 55 and patient is currently asymptomatic- we will continue to monitor   Leukopenia - resolved  -- Repeat WBC 5.2   4. Discharge Planning:   -- Social work and case management to assist with discharge planning and identification of hospital follow-up needs prior to discharge  -- Estimated LOS: 5-7 days  -- Discharge Concerns: Need to establish a safety plan; Medication compliance and effectiveness  -- Discharge Goals: Return home with outpatient referrals for mental health follow-up including medication  management/psychotherapy   , MD, 03/06/2022, 5:28 PM

## 2022-03-06 NOTE — Progress Notes (Signed)
?   03/06/22 2035  ?Psych Admission Type (Psych Patients Only)  ?Admission Status Involuntary  ?Psychosocial Assessment  ?Patient Complaints None  ?Eye Contact Fair  ?Facial Expression Anxious  ?Affect Appropriate to circumstance  ?Speech Logical/coherent  ?Interaction Assertive  ?Motor Activity Other (Comment) ?(wnl)  ?Appearance/Hygiene Unremarkable  ?Behavior Characteristics Cooperative  ?Mood Pleasant  ?Thought Process  ?Coherency WDL  ?Content WDL  ?Delusions None reported or observed  ?Perception WDL  ?Hallucination None reported or observed  ?Judgment Impaired  ?Confusion None  ?Danger to Self  ?Current suicidal ideation? Denies  ?Danger to Others  ?Danger to Others None reported or observed  ? ?Pt seen in dayroom. Pt denies SI, HI, AVH and pain. Pt denies anxiety and depression. Pt states that he will be discharged tomorrow morning. Pt said he was given a comprehensive list of rehab programs to choose from. Pt continues to deny any withdrawal symptoms. Pt asks for trazodone and vistaril for sleep tonight.  ?

## 2022-03-06 NOTE — Plan of Care (Signed)
Nurse discussed coping skills with patient.  

## 2022-03-06 NOTE — BHH Counselor (Signed)
CSW spoke with patient girlfriend, [name], who is advocating for patient to go door to door for substance use treatment. Girlfriend reports that a friend was assisting with trying to find bed placement and [facility] accepted him but his insurance would not cover the entirety of the stay.  Patient girlfriend requested that patient be referred to other places to see what his insurance would cover and would not.  CSW discussed patient plans to not do a door to door but how patient has said that he would rather go after he "ties up loose ends." CSW also let girlfriend know that patient would have to agree for this CSW to send in referrals.   ? ? ?CSW spoke with patient who agreed that this CSW could put in paperwork referrals but that he would follow up after he was discharged.  Patient continues to advocate for a next day discharge after he finishes an ativan taper.  CSW agreed to put in paperwork referrals and educated him that he would need to reach out to finish screeners and find out what insurance will cover and not cover. Patient agreed to this.  ? ? ?Samel Bruna, LCSW, LCAS ?Clincal Social Worker  ?[facility] ? ?

## 2022-03-06 NOTE — Progress Notes (Signed)
D: Data from patient's self inventory sheet. Patient reports good sleep last night. He reports his sleep medication was helpful last night. Good appetite, high energy, good concentration, rates his depression on a scale of 0. Reports his hopelessness being a 0 and anxiety being a 1. Denies any physical problems today. Patient's goal for today is to work on his discharge plan and meet with SW and MD's regarding discharge.  ? ?A: Medications administered per MD orders. Emotional support and encouragement provided to patient.  ? ?R: Denies SI, HI and contracts for safety. Denies A/V hallucinations. Safety checks continue q19mins.  ?

## 2022-03-06 NOTE — BHH Group Notes (Signed)
Spiritual care group on grief and loss facilitated by chaplain Janne Napoleon, [facility]  ? ?Group Goal:  ? ?Support / Education around grief and loss  ? ?Members engage in facilitated group support and psycho-social education.  ? ?Group Description:  ? ?Following introductions and group rules, group members engaged in facilitated group dialog and support around topic of loss, with particular support around experiences of loss in their lives. Group Identified types of loss (relationships / self / things) and identified patterns, circumstances, and changes that precipitate losses. Reflected on thoughts / feelings around loss, normalized grief responses, and recognized variety in grief experience. Group noted Worden's four tasks of grief in discussion.  ? ?Group drew on Adlerian / Rogerian, narrative, MI,  ? ?Patient Progress: Keith Miller attended group and participated actively in group conversation. He shared about some significant losses in his life and about his coping strategies.  Following group, he was anxious about an interaction with a patient.  Chaplain provided listening presence. ? ?[employer], Bcc ?Pager, [phone] ?10:44 AM ? ?

## 2022-03-06 NOTE — Group Note (Signed)
Date:  03/06/2022 ?Time:  11:23 AM ? ?Group Topic/Focus:  ?Goals Group:   The focus of this group is to help patients establish daily goals to achieve during treatment and discuss how the patient can incorporate goal setting into their daily lives to aide in recovery. ?Orientation:   The focus of this group is to educate the patient on the purpose and policies of crisis stabilization and provide a format to answer questions about their admission.  The group details unit policies and expectations of patients while admitted. ? ? ? ?Participation Level:  Active ? ?Participation Quality:  Appropriate ? ?Affect:  Appropriate ? ?Cognitive:  Appropriate ? ?Insight: Appropriate ? ?Engagement in Group:  Engaged ? ?Modes of Intervention:  Discussion ? ?Additional Comments:   ? ?Jaquita Rector ?03/06/2022, 11:23 AM ? ?

## 2022-03-06 NOTE — BHH Group Notes (Signed)
Adult Psychoeducational Group Note ? ?Date:  03/06/2022 ?Time:  10:17 PM ? ?Group Topic/Focus:  ?Wrap-Up Group:   The focus of this group is to help patients review their daily goal of treatment and discuss progress on daily workbooks. ? ?Participation Level:  Active ? ?Participation Quality:  Attentive ? ?Affect:  Appropriate ? ?Cognitive:  Alert ? ?Insight: Improving ? ?Engagement in Group:  Engaged ? ?Modes of Intervention:  Discussion ? ?Additional Comments:  Pt rated his day as 8/10 felt good, had the opportunity to go outside with he feels helps him mentally. He was able to work with SW and Dr to come up with a plan for discharge. It is a plan that him and his family is agreeable to in order to get the treatment that he feels that he needs. Discussed the importance of discharge planning, the purpose of following the plan and how communicating with family and service providers will assist with maintaining stability from a mental health standpoint. ? ?Maura Crandall Cassandra ?03/06/2022, 10:17 PM ?

## 2022-03-07 ENCOUNTER — Encounter (HOSPITAL_COMMUNITY): Payer: 59

## 2022-03-07 DIAGNOSIS — F1020 Alcohol dependence, uncomplicated: Secondary | ICD-10-CM

## 2022-03-07 MED ORDER — GABAPENTIN 100 MG PO CAPS: 100.0000 mg | ORAL_CAPSULE | Freq: Three times a day (TID) | ORAL | 0 refills | Status: DC

## 2022-03-07 MED ORDER — HYDROXYZINE HCL 25 MG PO TABS: 25.0000 mg | ORAL_TABLET | Freq: Four times a day (QID) | ORAL | 0 refills | Status: DC | PRN

## 2022-03-07 MED ORDER — ESCITALOPRAM OXALATE 10 MG PO TABS: 10.0000 mg | ORAL_TABLET | Freq: Every day | ORAL | 0 refills | Status: DC

## 2022-03-07 MED ORDER — STRESS FORMULA/ZINC PO TABS: 1.0000 | ORAL_TABLET | Freq: Every day | ORAL | 0 refills | Status: DC

## 2022-03-07 NOTE — Progress Notes (Signed)
?  [facility] Adult Case Management Discharge Plan : ? ?Will you be returning to the same living situation after discharge:  Yes,  patient is returning home and plans to go to a residential facility ?At discharge, do you have transportation home?: Yes,  yes, patient states that his sister will be picking him up ?Do you have the ability to pay for your medications: Yes,  insurance ? ?Release of information consent forms completed and in the chart;  Patient's signature needed at discharge. ? ?Patient to Follow up at: ? Follow-up Information   ? ? BEH[facility]IORAL [facility]ALTH [facility]. Go on 03/14/2022.   ?Specialty: Behavioral Health ?Why: You have an appointment to start  substance abuse intensive outpatient therapy services on 03/14/22 at 9:00 am.  This appointment will be held in person.  Medication management services are also available when enrolled in this program. If you end up going to a 30 day residential program, you can call and cancel this appointment. ?Contact information: ?[address] ?[id number] ?[place] [address] ?[phone] ? ?  ?  ? ?  ?  ? ?  ? ? ?Next level of care provider has access to [facility] Link:yes ? ?Safety Planning and Suicide Prevention discussed: Yes,  wife, Keith Miller ? ?  ? ?Has patient been referred to the Quitline?: N/A patient is not a smoker ? ?Patient has been referred for addiction treatment: Yes ? ?Keith Miller E Dixie Coppa, LCSW ?03/07/2022, 10:05 AM ?

## 2022-03-07 NOTE — Progress Notes (Signed)
[facility] called and reported that they had been in contact about patient self paying for treatment.  They requested that patient call to participate in prescreener assessment.  CSW provided number to patient to participate in prescreener and advised to call before patient is discharged.  ? ? ?Addie Alonge, LCSW, LCAS ?Clincal Social Worker  ?[facility] ? ?

## 2022-03-07 NOTE — Progress Notes (Signed)
Pt discharged to lobby. Pt was stable and appreciative at that time. All papers and prescriptions were given and valuables returned. Verbal understanding expressed. Denies SI/HI and A/VH. Pt given opportunity to express concerns and ask questions.  

## 2022-03-07 NOTE — Discharge Summary (Signed)
Physician Discharge Summary Note  Patient:  Keith Miller is an 51 y.o., male MRN:  [id number] DOB:  02/28/1971 Patient phone:  [phone] (home)  Patient address:   [address] [place] [address],  Total Time spent with patient: 20 minutes  Date of Admission:  03/04/2022 Date of Discharge: 03/07/2022  Reason for Admission:   Per H&P 03/04/22: History of Present Illness:  Keith Miller is a 51 yo patient w/ PPH of MDD and EtoH use disorder (recent hospitalization at [facility] 01/2022) who presented to [facility] after transfer from [facility] presenting intoxicated and per EMR endorsing depression. Per EMR patient presented to the ED with IVC in place w/ family reporting that patient had been endorsing SI w/ plan and sending out text saying "I love you very much."  On assessment patient is Aox4. Patient reports that he recalls that he went to the store and bought alcohol and proceeded to drink "3 1/2 pints of Vodka" on Saturday. Patient reports that since his d'c from [facility] until yesterday he had been sober. Patient reports that he is not really sure he had a trigger. Patient reports that he did have a friend come into town, but the friend had actually discouraged EtoH. Patient reports he has also been feeling more stressed lately as he has a pending DWI case on 3/8 and his GF and her son live in a different household, despite having lived together for years. Patient reports that the change in households has been difficult, but his GF is supportive of him. Patient reports that he has not been very compliant with his Naltrexone or Lexapro since discharge. Patient reports he wants to go to rehab.   Patient reports that since his last discharge he has not been sleeping well as he stays up worrying about his legal matters and finances. Patient endorses significant feelings of guilt related to how his addiction impacts the people around him. Patient also endorses decreased appetite. Patient denies anhedonia,  change in energy, concentration or SI. Patient reports that he thinks his mood has been ok since discharge. On assessment today patient denies SI, HI, and AVH. Patient does not endorse any symptoms of paranoia.    Patient does not endorse any hx of symptoms concerning for hx of manic or hypomanic episodes.    Patient endorses that he mostly finds himself anxious in certain situations. Patient denies hx of panic attacks but endorses during assessment "I feel like I may have one right now."    Patient denies any symptoms concerning for PTSD.  Principal Problem: Alcohol use disorder, severe, dependence (HCC) Discharge Diagnoses: Principal Problem:   Alcohol use disorder, severe, dependence (HCC) Active Problems:   Alcohol withdrawal (HCC)   MDD (major depressive disorder), recurrent episode [facility])   Past Psychiatric History: INPT: [facility] 01/2022 for MDD and Etoh Use disorder Meds: Lexapro 20mg  , Naltrexone 50mg , Trazodone 50mg   Past Medical History:  Past Medical History:  Diagnosis Date   ETOH abuse     Past Surgical History:  Procedure Laterality Date   ANKLE SURGERY     KNEE SURGERY     Family History: History reviewed. No pertinent family history. Family Psychiatric  History: Mom- Depression, unknown medications Social History:  Social History   Substance and Sexual Activity  Alcohol Use Yes   Comment: 3-4 half pints of vodka QD     Social History   Substance and Sexual Activity  Drug Use Not Currently    Social History  Socioeconomic History   Marital status: Single    Spouse name: Not on file   Number of children: Not on file   Years of education: Not on file   Highest education level: Not on file  Occupational History   Not on file  Tobacco Use   Smoking status: Never   Smokeless tobacco: Never  Vaping Use   Vaping Use: Never used  Substance and Sexual Activity   Alcohol use: Yes    Comment: 3-4 half pints of vodka QD   Drug use: Not Currently   Sexual  activity: Yes    Birth control/protection: None  Other Topics Concern   Not on file  Social History Narrative   Not on file   Social Determinants of Health   Financial Resource Strain: Not on file  Food Insecurity: Not on file  Transportation Needs: Not on file  Physical Activity: Not on file  Stress: Not on file  Social Connections: Not on file    Hospital Course:  During the course of patient's hospitalization, the 15-minute checks were adequate to ensure patient's safety. Patient did not exhibit erratic or aggressive behavior and was compliant with scheduled medication. Patient was recommended for outpatient psychiatry.  At the time of discharge patient is not reporting any acute suicidal/homicidal ideations/AVH, delusional thoughts or paranoia. Patient did not appear to be responding to any internal stimuli. Patient feels more confident about self-care & in managing their mental health problems. Patient currently denies any new issues or concerns. Education and supportive counseling provided throughout patient's hospital stay & upon discharge.   Today upon discharge evaluation, the patient gives a mood of "much better". Patient denies any specific concerns. Patient slept well, appetite good, regular bowel movements. Patient denies any new physical complaints. Patient feels that the medications have been helpful & is in agreement to continue to contact rehabilitation facilities for further care. He was offered bed-to bed transfer, but declined, as he had at-home business that he had to attend to before resuming substance treatment on an inpatient basis.  Patient was able to engage in safety planning including [facility] or contact emergency services if patient feels unable to maintain their own safety or the safety of others. Patient had no further questions, comments, or concerns. [facility] personal belongings in no apparent distress. Transportation per safe  transport to home was arranged for patient.   Physical Findings: AIMS: Facial and Oral Movements Muscles of Facial Expression: None, normal Lips and Perioral Area: None, normal Jaw: None, normal Tongue: None, normal,Extremity Movements Upper (arms, wrists, hands, fingers): None, normal Lower (legs, knees, ankles, toes): None, normal, Trunk Movements Neck, shoulders, hips: None, normal, Overall Severity Severity of abnormal movements (highest score from questions above): None, normal Incapacitation due to abnormal movements: None, normal Patient's awareness of abnormal movements (rate only patient's report): No Awareness, Dental Status Current problems with teeth and/or dentures?: No Does patient usually wear dentures?: No  CIWA:  CIWA-Ar Total: 1 COWS:     Musculoskeletal: Strength & Muscle Tone: within normal limits Gait & Station: normal Patient leans: N/A   Psychiatric Specialty Exam:  Presentation  General Appearance: -- (wearing appropriate attire, but visibly with full body  tremor, eyes erythematous, picking at skin on legs, restless in chair)  Eye Contact:Fair  Speech:Slow (trying to speak over obvious tremor)  Speech Volume:Normal  Handedness:Right   Mood and Affect  Mood:-- ("fine")  Affect:Restricted   Thought Process  Thought Processes:Coherent  Descriptions of Associations:Intact  Orientation:Full (Time, Place and Person)  Thought Content:Logical  History of Schizophrenia/Schizoaffective disorder:No  Duration of Psychotic Symptoms:No data recorded Hallucinations:No data recorded Ideas of Reference:None  Suicidal Thoughts:No data recorded Homicidal Thoughts:No data recorded  Sensorium  Memory:Immediate Good; Recent Good  Judgment:Impaired  Insight:Shallow   Executive Functions  Concentration:Fair  Attention Span:Fair  Recall:Good  Fund of Knowledge:Fair  Language:Good   Psychomotor Activity  Psychomotor Activity:No data  recorded  Assets  Assets:Communication Skills; Resilience; Housing; Social Support; Desire for Improvement   Sleep  Sleep:No data recorded   Physical Exam: Physical Exam Vitals and nursing note reviewed.  Constitutional:      Appearance: Normal appearance.  HENT:     Head: Normocephalic.     Nose: Nose normal.  Eyes:     Extraocular Movements: Extraocular movements intact.  Cardiovascular:     Rate and Rhythm: Normal rate.  Pulmonary:     Effort: Pulmonary effort is normal.  Musculoskeletal:        General: Normal range of motion.     Cervical back: Normal range of motion.  Neurological:     General: No focal deficit present.     Mental Status: He is alert and oriented to person, place, and time.  Psychiatric:        Mood and Affect: Mood normal.        Behavior: Behavior normal.        Thought Content: Thought content normal.        Judgment: Judgment normal.    Review of Systems  Constitutional:  Negative for fever.  HENT:  Negative for hearing loss.   Musculoskeletal:  Negative for myalgias.  Skin:  Negative for rash.  Neurological:  Negative for tremors and headaches.  Psychiatric/Behavioral:  Negative for hallucinations and suicidal ideas. The patient is not nervous/anxious and does not have insomnia.   Blood pressure (!) 119/100, pulse 91, temperature 98 F (36.7 C), temperature source Oral, resp. rate 18, height 6' (1.829 m), weight 94 kg, SpO2 98 %. Body mass index is 28.1 kg/m.   Social History   Tobacco Use  Smoking Status Never  Smokeless Tobacco Never   Tobacco Cessation:  N/A, patient does not currently use tobacco products   Blood Alcohol level:  Lab Results  Component Value Date   ETH <10 03/04/2022   ETH 457 (HH) 03/03/2022    Metabolic Disorder Labs:  Lab Results  Component Value Date   HGBA1C 4.6 (L) 02/14/2022   MPG 85.32 02/14/2022   No results found for: PROLACTIN Lab Results  Component Value Date   CHOL 142 02/14/2022    TRIG 114 02/14/2022   HDL 61 02/14/2022   CHOLHDL 2.3 02/14/2022   VLDL 23 02/14/2022   LDLCALC 58 02/14/2022    See Psychiatric Specialty Exam and Suicide Risk Assessment completed by Attending Physician prior to discharge.  Discharge destination:  Home  Is patient on multiple antipsychotic therapies at discharge:  No     Discharge Instructions     Diet - low sodium heart healthy   Complete by: As directed    Discharge instructions   Complete by: As directed    Follow up with therapy and psychiatry   Increase activity slowly   Complete by: As directed       Allergies as of 03/07/2022   No Known Allergies      Medication List     TAKE these medications      Indication  b  complex-C-E-zinc tablet Take 1 tablet by mouth daily.  Indication: dietary support   escitalopram 10 MG tablet Commonly known as: LEXAPRO Take 1 tablet (10 mg total) by mouth daily. Start taking on: March 08, 2022 What changed:  medication strength how much to take  Indication: Major Depressive Disorder   gabapentin 100 MG capsule Commonly known as: NEURONTIN Take 1 capsule (100 mg total) by mouth 3 (three) times daily.  Indication: Abuse or Misuse of Alcohol   hydrOXYzine 25 MG tablet Commonly known as: ATARAX Take 1 tablet (25 mg total) by mouth every 6 (six) hours as needed for anxiety.  Indication: Feeling Anxious   naltrexone 50 MG tablet Commonly known as: DEPADE Take 1 tablet (50 mg total) by mouth daily.  Indication: Abuse or Misuse of Alcohol   traZODone 50 MG tablet Commonly known as: DESYREL Take 1 tablet (50 mg total) by mouth at bedtime as needed for sleep.  Indication: Trouble Sleeping, Major Depressive Disorder        Follow-up Information     BEHAVIORAL HEALTH INTENSIVE CHEMICAL DEPENDENCY. Go on 03/14/2022.   Specialty: Behavioral Health Why: You have an appointment to start  substance abuse intensive outpatient therapy services on 03/14/22 at 9:00 am.  This  appointment will be held in person.  Medication management services are also available when enrolled in this program. Contact information: [address] [place] [address] [id number] mc [place] [address] [phone]                Follow-up recommendations:  Activity:  ad lib Diet:  cardiac: low salt, low fat  Comments:    Prescriptions for new medications provided for the patient to bridge to follow up appointment. The patient was informed that refills for these prescriptions are generally not provided, and patient is encouraged to attend all follow up appointments to address medication refills and adjustments.   Today's discharge was reviewed with treatment team, and the team is in agreement that the patient is ready for discharge. The patient is was of the discharge plan for today and has been given opportunity to ask questions. At time of discharge, the patient does not vocalize any acute harm to self or others, is goal directed, able to advocate for self and organizational baseline.   At discharge, the patient is instructed to:  Take all medications as prescribed. Report any adverse effects and or reactions from the medicines to her outpatient provider promptly.  Do not engage in alcohol and/or illegal drug use while on prescription medicines.  In the event of worsening symptoms, patient is instructed to call the crisis hotline, 911 and or go to the nearest ED for appropriate evaluation and treatment of symptoms.  Follow-up with primary care provider for further care of medical issues, concerns and or health care needs. * Substance abuse follow up: it is recommended that you follow up with community support treatment, like AA/NA. It is also recommended that the patient attend 90 meetings in 90 days, otherwise known as "90 in 71"   Signed: Roselle Locus, MD 03/07/2022, 9:25 AM

## 2022-03-07 NOTE — BHH Suicide Risk Assessment (Addendum)
[facility] Discharge Suicide Risk Assessment ? ? ?Principal Problem: Alcohol use disorder, severe, dependence (HCC) ?Discharge Diagnoses: Principal Problem: ?  Alcohol use disorder, severe, dependence (HCC) ?Active Problems: ?  Alcohol withdrawal (HCC) ?  MDD (major depressive disorder), recurrent episode (HCC) ? ? ?Total Time spent with patient: 20 minutes ? ?Musculoskeletal: ?Strength & Muscle Tone: within normal limits ?Gait & Station: normal ?Patient leans: N/A ? ?Psychiatric Specialty Exam ? ?Presentation  ?General Appearance: -- (wearing appropriate attire, but visibly with full body  tremor, eyes erythematous, picking at skin on legs, restless in chair) ? ?Eye Contact:Fair ? ?Speech:Slow (trying to speak over obvious tremor) ? ?Speech Volume:Normal ? ?Handedness:Right ? ? ?Mood and Affect  ?Mood:-- ("fine") ? ?Duration of Depression Symptoms: Greater than two weeks ? ?Affect:Restricted ? ? ?Thought Process  ?Thought Processes:Coherent ? ?Descriptions of Associations:Intact ? ?Orientation:Full (Time, Place and Person) ? ?Thought Content:Logical ? ?History of Schizophrenia/Schizoaffective disorder:No ? ?Duration of Psychotic Symptoms:No data recorded ?Hallucinations:No data recorded ?Ideas of Reference:None ? ?Suicidal Thoughts:No data recorded ?Homicidal Thoughts:No data recorded ? ?Sensorium  ?Memory:Immediate Good; Recent Good ? ?Judgment:Impaired ? ?Insight:Shallow ? ? ?Executive Functions  ?Concentration:Fair ? ?Attention Span:Fair ? ?Recall:Good ? ?Fund of Knowledge:Fair ? ?Language:Good ? ? ?Psychomotor Activity  ?Psychomotor Activity:No data recorded ? ?Assets  ?Assets:Communication Skills; Resilience; Housing; Social Support; Desire for Improvement ? ? ?Sleep  ?Sleep:No data recorded ? ?Physical Exam: ?Physical Exam ?Vitals and nursing note reviewed.  ?Constitutional:   ?   Appearance: Normal appearance.  ?HENT:  ?   Head: Normocephalic.  ?   Nose: Nose normal.  ?Eyes:  ?   Extraocular Movements: Extraocular  movements intact.  ?Cardiovascular:  ?   Rate and Rhythm: Normal rate.  ?Pulmonary:  ?   Effort: Pulmonary effort is normal.  ?Musculoskeletal:     ?   General: Normal range of motion.  ?   Cervical back: Normal range of motion.  ?Neurological:  ?   General: No focal deficit present.  ?   Mental Status: He is alert and oriented to person, place, and time.  ?Psychiatric:     ?   Mood and Affect: Mood normal.     ?   Behavior: Behavior normal.     ?   Thought Content: Thought content normal.     ?   Judgment: Judgment normal.  ? ?Review of Systems  ?Constitutional:  Negative for fever.  ?HENT:  Negative for hearing loss.   ?Musculoskeletal:  Negative for myalgias.  ?Skin:  Negative for rash.  ?Neurological:  Negative for tremors and headaches.  ?Psychiatric/Behavioral:  Negative for hallucinations and suicidal ideas. The patient is not nervous/anxious and does not have insomnia.   ?Blood pressure (!) 119/100, pulse 91, temperature 98 ?F (36.7 ?C), temperature source Oral, resp. rate 18, height 6' (1.829 m), weight 94 kg, SpO2 98 %. Body mass index is 28.1 kg/m?. ? ?Mental Status Per Nursing Assessment::   ?On Admission:  NA ? ?Demographic Factors:  ?Male and Caucasian ? ?Loss Factors: ?Legal issues ? ?Historical Factors: ?NA ? ?Risk Reduction Factors:   ?Positive social support ? ?Continued Clinical Symptoms:  ?Major depressive disorder ?Alcohol use disorder ? ?Cognitive Features That Contribute To Risk:  ?None   ? ?Suicide Risk:  ?Mild:  Suicidal ideation of limited frequency, intensity, duration, and specificity.  There are no identifiable plans, no associated intent, mild dysphoria and related symptoms, good self-control (both objective and subjective assessment), few other risk factors, and identifiable protective factors, including available  and accessible social support. ? ? Follow-up Information   ? ? BEHAVIORAL HEALTH INTENSIVE CHEMICAL DEPENDENCY. Go on 03/14/2022.   ?Specialty: Behavioral Health ?Why: You  have an appointment to start  substance abuse intensive outpatient therapy services on 03/14/22 at 9:00 am.  This appointment will be held in person.  Medication management services are also available when enrolled in this program. ?Contact information: ?[address] ?[id number] mc ?[place] [address] ?[phone] ? ?  ?  ? ?  ?  ? ?  ? ? ?Plan Of Care/Follow-up recommendations:  ?Activity:  as tolerated ?Diet:  cardiac- low fat, low sodium ?Other:    ?Prescriptions for new medications provided for the patient to bridge to follow up appointment. The patient was informed that refills for these prescriptions are generally not provided, and patient is encouraged to attend all follow up appointments to address medication refills and adjustments.  ? ?Today's discharge was reviewed with treatment team, and the team is in agreement that the patient is ready for discharge. The patient is was of the discharge plan for today and has been given opportunity to ask questions. At time of discharge, the patient does not vocalize any acute harm to self or others, is goal directed, able to advocate for self and organizational baseline.  ? ?At discharge, the patient is instructed to:  ?Take all medications as prescribed. ?Report any adverse effects and or reactions from the medicines to her outpatient provider promptly.  ?Do not engage in alcohol and/or illegal drug use while on prescription medicines.  ?In the event of worsening symptoms, patient is instructed to call the crisis hotline, 911 and or go to the nearest ED for appropriate evaluation and treatment of symptoms.  ?Follow-up with primary care provider for further care of medical issues, concerns and or health care needs. ?* Substance abuse follow up: it is recommended that you follow up with community support treatment, like AA/NA. It is also recommended that the patient attend 90 meetings in 90 days, otherwise known as "90 in 90"  ? ?Roselle Locus, MD ?03/07/2022, 9:20 AM ?

## 2022-03-07 NOTE — BHH Counselor (Signed)
CSW called from [facility] stating that patient was interested in self pay.  [facility] states that they wanted to participate in prescreener prior to him discharging. CSW provided patient with number to participate in prescreener.  Patient called and stated that he completed screener. Patient continues to express wanting to discharge today and discharge is planned for today.  ? ? ?Shequila Neglia, LCSW, LCAS ?Clincal Social Worker  ?[facility] ? ?

## 2022-03-08 ENCOUNTER — Encounter (HOSPITAL_COMMUNITY): Payer: 59

## 2022-03-08 NOTE — Plan of Care (Signed)
The therapist revises the target date for Keith Miller's SA IOP Treatment Plan as he did not start on the intended date with his new start date scheduled for 03/14/22.   Problem: Keith Miller will abstain from using alcohol as evidenced by having no urine screens testing positive for alcohol.  Goal:  02/19/2022 Outcome: Not Applicable Goal: LTG: Improve quality of life by maintaining ongoing abstinence from all mood-altering substances: Input needed on appropriate metric Outcome: Not Applicable Goal: LTG: Keith Miller WILL INCREASE COPING SKILLS TO PROMOTE LONG-TERM RECOVERY AND IMPROVE ABILITY TO PERFORM DAILY ACTIVITIES Outcome: Not Applicable Goal: STG: Promote adherence to treatment regimen Outcome: Not Applicable

## 2022-03-12 ENCOUNTER — Encounter (HOSPITAL_COMMUNITY): Payer: 59

## 2022-03-14 ENCOUNTER — Encounter (HOSPITAL_COMMUNITY): Payer: 59

## 2022-03-14 ENCOUNTER — Telehealth (HOSPITAL_COMMUNITY): Payer: Self-pay | Admitting: Licensed Clinical Social Worker

## 2022-03-14 NOTE — Telephone Encounter (Signed)
The therapist calls [facility] leaving a HIPAA-compliant voicemail in response to Nehemiah's no show for SA IOP today. ? ?Myrna Blazer, MA, LCSW, LCMHC, LCAS ?

## 2022-03-15 ENCOUNTER — Encounter (HOSPITAL_COMMUNITY): Payer: 59

## 2022-03-16 ENCOUNTER — Encounter (HOSPITAL_COMMUNITY): Payer: 59

## 2022-03-19 ENCOUNTER — Encounter (HOSPITAL_COMMUNITY): Payer: 59

## 2022-03-21 ENCOUNTER — Encounter (HOSPITAL_COMMUNITY): Payer: 59

## 2022-03-22 ENCOUNTER — Encounter (HOSPITAL_COMMUNITY): Payer: 59

## 2022-03-23 ENCOUNTER — Encounter (HOSPITAL_COMMUNITY): Payer: 59

## 2022-03-26 ENCOUNTER — Encounter (HOSPITAL_COMMUNITY): Payer: 59

## 2022-03-28 ENCOUNTER — Encounter (HOSPITAL_COMMUNITY): Payer: 59

## 2022-03-30 ENCOUNTER — Other Ambulatory Visit: Payer: Self-pay

## 2022-03-30 ENCOUNTER — Emergency Department (HOSPITAL_COMMUNITY)
Admission: EM | Admit: 2022-03-30 | Discharge: 2022-03-30 | Discharge status: Against Medical Advice | Payer: 59 | Attending: Emergency Medicine | Admitting: Emergency Medicine

## 2022-03-30 ENCOUNTER — Encounter (HOSPITAL_COMMUNITY): Payer: Self-pay

## 2022-03-30 ENCOUNTER — Encounter (HOSPITAL_COMMUNITY): Payer: 59

## 2022-03-30 DIAGNOSIS — R45851 Suicidal ideations: Secondary | ICD-10-CM | POA: Insufficient documentation

## 2022-03-30 DIAGNOSIS — F1020 Alcohol dependence, uncomplicated: Secondary | ICD-10-CM | POA: Insufficient documentation

## 2022-03-30 DIAGNOSIS — R7309 Other abnormal glucose: Secondary | ICD-10-CM | POA: Insufficient documentation

## 2022-03-30 LAB — CBG MONITORING, ED: Glucose-Capillary: 105 mg/dL — ABNORMAL HIGH (ref 70–99)

## 2022-03-30 NOTE — ED Triage Notes (Signed)
Patient's sister reports that the patient told her while in this room that he wanted to leave and kill himself 3 times. ?Patient denies having a plan. ?

## 2022-03-30 NOTE — ED Notes (Addendum)
Pt walked out of Emergency Department. Per Derwood Kaplan MD, pt can leave emergency department on his own choice and is not held under IVC.  ?

## 2022-03-30 NOTE — ED Provider Notes (Signed)
?[facility]-EMERGENCY DEPT ?Provider Note ? ? ?CSN: [id number] ?Arrival date & time: 03/30/22  1329 ? ?  ? ?History ? ?Chief Complaint  ?Patient presents with  ? Alcohol Intoxication  ? Suicidal  ? ? ?Keith Miller is a 51 y.o. male. ? ?HPI ? ?  ? ?51 year old male with history of alcoholism brought into the ER by EMS with chief complaint of heavy alcohol intoxication. ? ?Tech asked me to see the patient right away, as patient wants to leave.  He is not IVC.  Patient has denied any SI, HI to them.  EMS at the bedside also indicates that patient has not mentioned any SI or HI to them. ? ?Spoke with patient's sister, who is at the bedside.  She decays that patient has had alcohol related problems for several months and he has a bed at [facility] for tomorrow for intensive inpatient treatment.  Patient was found in [facility] with 3 bottles of wine.  Family is hoping that patient can be kept in the ER so that he can go straight from ER to [facility].  They were requesting that we might want to consider medical IVC.  They are not sure if patient would stay with them if he is discharged. ? ?Patient aware that he is in the ER, able to tell me that he is with his sister.  He understands that they are upset because of what has occurred.  He admits to wanting to get better, and is aware that he is supposed to go to [facility].  He is hoping to make meaningful strides tomorrow. ? ?Home Medications ?Prior to Admission medications   ?Medication Sig Start Date End Date Taking? Authorizing Provider  ?escitalopram (LEXAPRO) 10 MG tablet Take 1 tablet (10 mg total) by mouth daily. 03/08/22   Roselle Locus, MD  ?gabapentin (NEURONTIN) 100 MG capsule Take 1 capsule (100 mg total) by mouth 3 (three) times daily. 03/07/22   Roselle Locus, MD  ?hydrOXYzine (ATARAX) 25 MG tablet Take 1 tablet (25 mg total) by mouth every 6 (six) hours as needed for anxiety. 03/07/22   Roselle Locus, MD  ?Multiple Vitamins-Minerals (B COMPLEX-C-E-ZINC) tablet Take 1 tablet by mouth daily. 03/07/22   Roselle Locus, MD  ?naltrexone (DEPADE) 50 MG tablet Take 1 tablet (50 mg total) by mouth daily. 02/16/22   Massengill, Harrold Donath, MD  ?traZODone (DESYREL) 50 MG tablet Take 1 tablet (50 mg total) by mouth at bedtime as needed for sleep. 02/16/22   Phineas Inches, MD  ?   ? ?Allergies    ?Patient has no known allergies.   ? ?Review of Systems   ?Review of Systems ? ?Physical Exam ?Updated Vital Signs ?BP (!) 132/91 (BP Location: Right Arm)   Pulse 90   Temp 98.1 ?F (36.7 ?C) (Oral)   Resp 18   Ht 6' (1.829 m)   Wt 99.8 kg   SpO2 96%   BMI 29.84 kg/m?  ?Physical Exam ?Vitals and nursing note reviewed.  ?Constitutional:   ?   Appearance: He is well-developed.  ?HENT:  ?   Head: Atraumatic.  ?Eyes:  ?   Extraocular Movements: Extraocular movements intact.  ?Cardiovascular:  ?   Rate and Rhythm: Normal rate.  ?Pulmonary:  ?   Effort: Pulmonary effort is normal.  ?Abdominal:  ?   Tenderness: There is no abdominal tenderness.  ?Musculoskeletal:  ?   Cervical back: Neck supple.  ?Skin: ?  General: Skin is warm.  ?Neurological:  ?   Mental Status: He is alert and oriented to person, place, and time.  ? ? ?ED Results / Procedures / Treatments   ?Labs ?(all labs ordered are listed, but only abnormal results are displayed) ?Labs Reviewed  ?CBG MONITORING, ED - Abnormal; Notable for the following components:  ?    Result Value  ? Glucose-Capillary 105 (*)   ? All other components within normal limits  ? ? ?EKG ?None ? ?Radiology ?No results found. ? ?Procedures ?Procedures  ? ? ?Medications Ordered in ED ?Medications - No data to display ? ?ED Course/ Medical Decision Making/ A&P ?  ?                        ?Medical Decision Making ? ?51 year old male comes in with chief complaint of alcoholism. ? ?He has known history of alcoholism, and is supposed to get inpatient treatment at [facility] tomorrow.  He  was found locked in a stall at [facility] with 3 bottles of wine next to him. ? ?Judson is currently oriented to self, location and year.  He is aware of his surroundings and the circumstances that led him to the ER.  He is resentful and is hopeful that he can get treatment tomorrow, that will help him better cope with his circumstances. ? ?Family is wanting to involuntarily commit him.  Patient did not demonstrate any SI or HI at arrival -and does not even appear that he wants to necessarily get treatment tomorrow.  I do not think IVC will be helpful.  I do not think IVC is an appropriate tool for the patient at this time.  I discussed this with the sister, who is understanding with explanation.  ? ?I did inform her that I will speak with our psych service to see if we can expedite transfer to [facility]. ? ?Upon conversation with our psych team, they informed me that if patient is involuntarily committed, [facility] will not accept him.  I personally called [facility] myself to see if they can see him today, however they informed me that there are no beds, but they will be able to see patient as early as 8 AM tomorrow instead of afternoon. ? ?I mentioned this information to the sister - she is understanding and appreciative. ? ?While we were trying to manage the transfer and get more information in the background, patient repeatedly wanted to leave.  Allegedly, he told family that he wanted to kill himself.  This information was not shared in front of me.   ? ?I think the IVC will be harmful for multiple reasons including if patient is not ready for sobriety himself, it would probably add more mistrust to the health system and he will not be able to get into [facility] tomorrow as planned. ? ?I reviewed patient's records from the past. ? ?I do not think we will proceed with IVC, patient can elope if needed.  He was coherent when speaking with me and expressing good judgment.  He is walking  without falling, something I witnessed personally during this encounter. ? ?Final Clinical Impression(s) / ED Diagnoses ?Final diagnoses:  ?Alcoholism (HCC)  ? ? ?Rx / DC Orders ?ED Discharge Orders   ? ? None  ? ?  ? ? ?  ?Derwood Kaplan, MD ?03/30/22 1552 ? ?

## 2022-03-30 NOTE — ED Triage Notes (Signed)
Per EMS- Patient was found locked in a bathroom stall at [facility] with 3 bottles of wine present. ? ?Family came to the scene and stated that he is scheduled to go to the [facility] tomorrow and that he has had 2 recent falls. ?

## 2022-03-30 NOTE — ED Notes (Addendum)
Pt walked out of ED, pt's sister walked with pt back to room. ?

## 2022-03-31 ENCOUNTER — Emergency Department (HOSPITAL_COMMUNITY)
Admission: EM | Admit: 2022-03-31 | Discharge: 2022-04-02 | Disposition: A | Discharge status: Other Acute Inpatient Hospital | Payer: 59 | Attending: Emergency Medicine | Admitting: Emergency Medicine

## 2022-03-31 ENCOUNTER — Encounter (HOSPITAL_COMMUNITY): Payer: Self-pay

## 2022-03-31 ENCOUNTER — Other Ambulatory Visit: Payer: Self-pay

## 2022-03-31 DIAGNOSIS — F1094 Alcohol use, unspecified with alcohol-induced mood disorder: Secondary | ICD-10-CM

## 2022-03-31 DIAGNOSIS — F10920 Alcohol use, unspecified with intoxication, uncomplicated: Secondary | ICD-10-CM

## 2022-03-31 DIAGNOSIS — F1020 Alcohol dependence, uncomplicated: Secondary | ICD-10-CM

## 2022-03-31 DIAGNOSIS — X58XXXA Exposure to other specified factors, initial encounter: Secondary | ICD-10-CM | POA: Insufficient documentation

## 2022-03-31 DIAGNOSIS — F332 Major depressive disorder, recurrent severe without psychotic features: Secondary | ICD-10-CM | POA: Insufficient documentation

## 2022-03-31 DIAGNOSIS — F1014 Alcohol abuse with alcohol-induced mood disorder: Secondary | ICD-10-CM | POA: Insufficient documentation

## 2022-03-31 DIAGNOSIS — Z20822 Contact with and (suspected) exposure to covid-19: Secondary | ICD-10-CM | POA: Insufficient documentation

## 2022-03-31 DIAGNOSIS — S80212A Abrasion, left knee, initial encounter: Secondary | ICD-10-CM | POA: Insufficient documentation

## 2022-03-31 DIAGNOSIS — R45851 Suicidal ideations: Secondary | ICD-10-CM | POA: Insufficient documentation

## 2022-03-31 DIAGNOSIS — Y908 Blood alcohol level of 240 mg/100 ml or more: Secondary | ICD-10-CM | POA: Insufficient documentation

## 2022-03-31 MED ORDER — HYDROXYZINE HCL 25 MG PO TABS
25.0000 mg | ORAL_TABLET | Freq: Four times a day (QID) | ORAL | Status: DC | PRN
  Administered 2022-04-01: 25 mg via ORAL
  Filled 2022-03-31: qty 1

## 2022-03-31 MED ORDER — TRAZODONE HCL 50 MG PO TABS
50.0000 mg | ORAL_TABLET | Freq: Every evening | ORAL | Status: DC | PRN
  Administered 2022-04-01: 50 mg via ORAL
  Filled 2022-03-31: qty 1

## 2022-03-31 MED ORDER — GABAPENTIN 100 MG PO CAPS
100.0000 mg | ORAL_CAPSULE | Freq: Three times a day (TID) | ORAL | Status: DC
  Administered 2022-04-01 (×4): 100 mg via ORAL
  Filled 2022-03-31 (×4): qty 1

## 2022-03-31 MED ORDER — THIAMINE HCL 100 MG PO TABS
100.0000 mg | ORAL_TABLET | Freq: Every day | ORAL | Status: DC
  Administered 2022-04-01: 100 mg via ORAL
  Filled 2022-03-31: qty 1

## 2022-03-31 MED ORDER — THIAMINE HCL 100 MG/ML IJ SOLN
100.0000 mg | Freq: Every day | INTRAMUSCULAR | Status: DC
  Administered 2022-03-31: 100 mg via INTRAVENOUS
  Filled 2022-03-31: qty 2

## 2022-03-31 MED ORDER — LORAZEPAM 2 MG/ML IJ SOLN
0.0000 mg | Freq: Four times a day (QID) | INTRAMUSCULAR | Status: DC
  Administered 2022-03-31: 2 mg via INTRAVENOUS
  Filled 2022-03-31: qty 1

## 2022-03-31 MED ORDER — NALTREXONE HCL 50 MG PO TABS
50.0000 mg | ORAL_TABLET | Freq: Every day | ORAL | Status: DC
  Administered 2022-04-01: 50 mg via ORAL
  Filled 2022-03-31 (×2): qty 1

## 2022-03-31 MED ORDER — LORAZEPAM 2 MG/ML IJ SOLN: 0.0000 mg | Freq: Two times a day (BID) | INTRAMUSCULAR | Status: DC

## 2022-03-31 MED ORDER — LORAZEPAM 1 MG PO TABS
0.0000 mg | ORAL_TABLET | Freq: Four times a day (QID) | ORAL | Status: DC
  Administered 2022-04-01 (×3): 2 mg via ORAL
  Administered 2022-04-02 (×2): 1 mg via ORAL
  Filled 2022-03-31: qty 2
  Filled 2022-03-31: qty 1
  Filled 2022-03-31: qty 2
  Filled 2022-03-31: qty 1
  Filled 2022-03-31: qty 2
  Filled 2022-03-31: qty 1

## 2022-03-31 MED ORDER — LORAZEPAM 1 MG PO TABS: 0.0000 mg | ORAL_TABLET | Freq: Two times a day (BID) | ORAL | Status: DC

## 2022-03-31 MED ORDER — LORAZEPAM 1 MG PO TABS
1.0000 mg | ORAL_TABLET | Freq: Once | ORAL | Status: DC
Start: 2022-03-31 — End: 2022-04-02
  Filled 2022-03-31: qty 1

## 2022-03-31 MED ORDER — ESCITALOPRAM OXALATE 10 MG PO TABS
10.0000 mg | ORAL_TABLET | Freq: Every day | ORAL | Status: DC
  Administered 2022-04-01: 10 mg via ORAL
  Filled 2022-03-31: qty 1

## 2022-03-31 NOTE — ED Notes (Signed)
Pt anxious, restless and uncooperative with [employer] at this time. CIWA-Ar score revised and pt will be medicated accordingly.  ?

## 2022-03-31 NOTE — ED Triage Notes (Signed)
Pt arrived from home via [place] [employer], Pt brought in per officers w threats to kill himself. Pt states that he is intoxicated x 2 bottles of wine. He has felt suicidal x 1 month. Currently going through a divorce. Went to rhab today then ran off. States that he wants to die, but has no plan to do so.  ?

## 2022-03-31 NOTE — BH Assessment (Addendum)
Comprehensive Clinical Assessment (CCA) Note ? ?03/31/2022 ?Keith Miller ?[id number] ? ?DISPOSITION: Gave clinical report to Cecilio Asper, NP who recommends inpatient dual-diagnosis treatment when medically cleared. [facility] has no beds available at this time. Notified Dr [name] and [name], RN of recommendation via secure message. ? ?The patient demonstrates the following risk factors for suicide: Chronic risk factors for suicide include: psychiatric disorder of major depressive disorder and alcohol use disorder and substance use disorder. Acute risk factors for suicide include: family or marital conflict, unemployment, social withdrawal/isolation, loss (financial, interpersonal, professional), and recent discharge from inpatient psychiatry. Protective factors for this patient include: responsibility to others (children, family). Considering these factors, the overall suicide risk at this point appears to be high. Patient is not appropriate for outpatient follow up. ? ?Flowsheet Row ED from 03/31/2022 in [facility] EMERGENCY DEPARTMENT ED from 03/30/2022 in [facility] Admission (Discharged) [facility]  ?C-SSRS RISK CATEGORY High Risk Low Risk No Risk  ? ?  ? ?Pt is a 51 year old male who presents to [facility] ED via law enforcement due to alcohol intoxication and verbalizing suicidal ideation. Pt was petitioned for involuntary commitment by a friend, Keith Miller [phone]. Affidavit and petition states: "Say he has given up on life. Threatened suicide." ? ?During assessment, Pt says he has not had any alcohol today but he told ED staff he drank 2 bottles of wine. Pt appears intoxicated with slow movements, slurred speech, and decreased concentration. During assessment, he denies suicidal ideation but ED staff report he stated he has felt suicidal for the past month and made comments today  about jumping in front of a moving vehicle. He denies history of suicide attempts. Pt describes his mood as depressed and anxious. Pt acknowledges symptoms including social withdrawal, fatigue, irritability, decreased concentration, decreased sleep, decreased appetite and feelings of guilt. He denies current homicidal ideation or history of violence. He denies auditory or visual hallucinations.  ? ?Pt denies drug use, however, has a hx of alcohol abuse. He started drinking sometime in high school. His average amount of use for the past 2-3 months are ?binges?. He reports drinking approximately two pints of vodka daily. He says he experiences withdrawal symptoms including night sweats, tremors, and irritability. He denies history of black outs or seizures. ? ?Patient's BAL=343. ? ?Pt identifies several stressors. He says his significant other of 16 years has bought her own home and their relationship is ending. He has moved in with his elderly father. He says he is unemployed, has debt, and financial stress. He says he has been charged with a DWI and has a pending court date. He says he has good support including two sisters, his father, and friends. He denies history of abuse. He denies access to firearms.  ? ?Pt says he has no current outpatient mental health providers. He says he is prescribe Lexapro but has not been taking it consistently. He was inpatient at [facility] in February and March of 2023. He has also been to [facility]. Pt reports he was at [facility] today and report from ED staff states he left. ? ?Pt is casually dressed, alert and oriented x4. He appears intoxicated and labs are in process. Pt speaks in a slurred tone, at moderate volume and slow pace. Motor behavior appears slowed. Eye contact is good. Pt's mood is depressed and affect is congruent with mood. Thought process is coherent  and relevant. There is no indication Pt is currently responding to internal stimuli or experiencing  delusional thought content. He says he wants to return to [facility]. Pt has been placed under involuntary commitment. ? ? ? ? ?Chief Complaint:  ?Chief Complaint  ?Patient presents with  ? Medical Clearance  ? ?Visit Diagnosis:  ?F33.2 Major depressive disorder, Recurrent episode, Severe ?F10.20 Alcohol use disorder, Severe ? ? ?CCA Screening, Triage and Referral (STR) ? ?Patient Reported Information ?How did you hear about us? Other (Comment) Mudlogger(Law enforcement) ? ?What Is the Reason for Your Visit/Call Today? [facility]. Pt has a history of alcohol dependence and appears intoxicated. He has made threats to kill himself by jumping in front of a vehicle. ? ?How Long Has This Been Causing You Problems? > than 6 months ? ?What Do You Feel Would Help You the Most Today? Alcohol or Drug Use Treatment; Treatment for Depression or other mood problem ? ? ?Have You Recently Had Any Thoughts About Hurting Yourself? Yes ? ?Are You Planning to Commit Suicide/Harm Yourself At This time? No ? ? ?Have you Recently Had Thoughts About Hurting Someone Karolee Ohslse? No ? ?Are You Planning to Harm Someone at This Time? No ? ?Explanation: No data recorded ? ?Have You Used Any Alcohol or Drugs in the Past 24 Hours? Yes ? ?How Long Ago Did You Use Drugs or Alcohol? No data recorded ?What Did You Use and How Much? Pt reports drinking approximately 2 pints of vodka daily ? ? ?Do You Currently Have a Therapist/Psychiatrist? No ? ?Name of Therapist/Psychiatrist: Pt participating in [facility] through [facility] ? ? ?Have You Been Recently Discharged From Any Office Practice or Programs? Yes ? ?Explanation of Discharge From Practice/Program: [facility] ? ? ?  ?CCA Screening Triage Referral Assessment ?Type of Contact: Tele-Assessment ? ?Telemedicine Service Delivery: Telemedicine service delivery: This service was provided via telemedicine using a 2-way,  interactive audio and video technology ? ?Is this Initial or Reassessment? Initial Assessment ? ?Date Telepsych consult ordered in [facility]:  03/31/22 ? ?Time Telepsych consult ordered in [facility]:  2129 ? ?[facility] ? ?[facility] ? ? ?Collateral Involvement: Medical record ? ? ?Does Patient Have a Automotive engineerCourt Appointed Legal Guardian? No data recorded ?Name and Contact of Legal Guardian: No data recorded ?If Minor and Not Living with Parent(s), Who has Custody? NA ? ?Is CPS involved or ever been involved? Never ? ?Is APS involved or ever been involved? Never ? ? ?Patient Determined To Be At Risk for Harm To Self or Others Based on Review of Patient Reported Information or Presenting Complaint? Yes, for Self-Harm ? ?Method: No data recorded ?Availability of Means: No data recorded ?Intent: No data recorded ?Notification Required: No data recorded ?Additional Information for Danger to Others Potential: No data recorded ?Additional Comments for Danger to Others Potential: No data recorded ?Are There Guns or Other Weapons in Your Home? No data recorded ?Types of Guns/Weapons: No data recorded ?Are These Weapons Safely Secured?                            No data recorded ?Who Could Verify You Are Able To Have These Secured: No data recorded ?Do You Have any Outstanding Charges, Pending Court Dates, Parole/Probation? No data recorded ?Contacted To Inform of Risk of Harm To Self or  Others: Unable to Contact: ? ? ? ?Does Patient Present under Involuntary Commitment? No ? ?IVC Papers Initial File Date: 03/03/22 ? ? ?[place] of Residence: [place] ? ? ?Patient Currently Receiving the Following Services: Not Receiving Services ? ? ?Determination of Need: Emergent (2 hours) ? ? ?Options For Referral: Inpatient Hospitalization ? ? ? ? ?CCA Biopsychosocial ?Patient Reported Schizophrenia/Schizoaffective Diagnosis in Past: No ? ? ?Strengths: Pt says he wants treatment for alcohol  use. ? ? ?Mental Health Symptoms ?Depression:   ?Change in energy/activity; Difficulty Concentrating; Fatigue; Increase/decrease in appetite; Irritability; Sleep (too much or little) ?  ?Duration of Depressive symptoms:  ?Durati

## 2022-03-31 NOTE — ED Provider Notes (Signed)
?[place] ?Provider Note ? ? ?CSN: [id number] ?Arrival date & time: 03/31/22  1939 ? ?  ? ?History ? ?Chief Complaint  ?Patient presents with  ? Medical Clearance  ? ? ?Keith Miller is a 51 y.o. male. ? ?Pt with etoh intoxication, hx chronic etoh use disorder. Pt has also been feeling depressed and had made comments about jumping in front of moving vehicle. Pt notes heavy, daily etoh use, including today. When stops drinking feels very shaky. Denies hx seizures or dts.  Currently denies pain. No headache. No chest pain or sob. No abd pain or nv. Abrasion to left knee. Last tetanus unknown.  ? ?The history is provided by the patient, medical records and the EMS personnel.  ? ?  ? ?Home Medications ?Prior to Admission medications   ?Medication Sig Start Date End Date Taking? Authorizing Provider  ?escitalopram (LEXAPRO) 10 MG tablet Take 1 tablet (10 mg total) by mouth daily. 03/08/22   Maida Sale, MD  ?gabapentin (NEURONTIN) 100 MG capsule Take 1 capsule (100 mg total) by mouth 3 (three) times daily. 03/07/22   Maida Sale, MD  ?hydrOXYzine (ATARAX) 25 MG tablet Take 1 tablet (25 mg total) by mouth every 6 (six) hours as needed for anxiety. 03/07/22   Maida Sale, MD  ?Multiple Vitamins-Minerals (B COMPLEX-C-E-ZINC) tablet Take 1 tablet by mouth daily. 03/07/22   Maida Sale, MD  ?naltrexone (DEPADE) 50 MG tablet Take 1 tablet (50 mg total) by mouth daily. 02/16/22   Massengill, Ovid Curd, MD  ?traZODone (DESYREL) 50 MG tablet Take 1 tablet (50 mg total) by mouth at bedtime as needed for sleep. 02/16/22   Janine Limbo, MD  ?   ? ?Allergies    ?Patient has no known allergies.   ? ?Review of Systems   ?Review of Systems  ?Constitutional:  Negative for fever.  ?HENT:  Negative for sore throat.   ?Respiratory:  Negative for shortness of breath.   ?Cardiovascular:  Negative for chest pain.  ?Gastrointestinal:  Negative for abdominal pain.   ?Genitourinary:  Negative for flank pain.  ?Musculoskeletal:  Negative for back pain and neck pain.  ?Skin:  Negative for rash.  ?Neurological:  Negative for headaches.  ?Hematological:  Does not bruise/bleed easily.  ?Psychiatric/Behavioral:  Positive for suicidal ideas.   ? ?Physical Exam ?Updated Vital Signs ?There were no vitals taken for this visit. ?Physical Exam ?Vitals and nursing note reviewed.  ?Constitutional:   ?   Appearance: Normal appearance. He is well-developed.  ?HENT:  ?   Head: Atraumatic.  ?   Nose: Nose normal.  ?   Mouth/Throat:  ?   Mouth: Mucous membranes are moist.  ?   Pharynx: Oropharynx is clear.  ?Eyes:  ?   General: No scleral icterus. ?Neck:  ?   Trachea: No tracheal deviation.  ?Cardiovascular:  ?   Rate and Rhythm: Normal rate and regular rhythm.  ?   Pulses: Normal pulses.  ?   Heart sounds: Normal heart sounds. No murmur heard. ?  No friction rub. No gallop.  ?Pulmonary:  ?   Effort: Pulmonary effort is normal. No accessory muscle usage or respiratory distress.  ?   Breath sounds: Normal breath sounds.  ?Abdominal:  ?   General: Bowel sounds are normal. There is no distension.  ?   Palpations: Abdomen is soft.  ?   Tenderness: There is no abdominal tenderness.  ?Genitourinary: ?   Comments: No cva  tenderness. ?Musculoskeletal:     ?   General: No swelling.  ?   Cervical back: Normal range of motion and neck supple. No rigidity.  ?Skin: ?   General: Skin is warm and dry.  ?   Findings: No rash.  ?Neurological:  ?   Mental Status: He is alert.  ?   Comments: Alert, speech clear.   ?Psychiatric:  ?   Comments: Depressed mood. +SI.   ? ? ?ED Results / Procedures / Treatments   ?Labs ?(all labs ordered are listed, but only abnormal results are displayed) ?Labs Reviewed  ?CBC  ?COMPREHENSIVE METABOLIC PANEL  ?ETHANOL  ?RAPID URINE DRUG SCREEN, HOSP PERFORMED  ? ? ?EKG ?None ? ?Radiology ?No results found. ? ?Procedures ?Procedures  ? ? ?Medications Ordered in ED ?Medications - No data  to display ? ?ED Course/ Medical Decision Making/ A&P ?  ?                        ?Medical Decision Making ?Problems Addressed: ?Alcoholic intoxication without complication [facility]): acute illness or injury with systemic symptoms that poses a threat to life or bodily functions ?Alcohol-induced mood disorder ([place]): acute illness or injury with systemic symptoms that poses a threat to life or bodily functions ?Chronic alcoholism ([place]): chronic illness or injury with exacerbation, progression, or side effects of treatment that poses a threat to life or bodily functions ?Suicidal ideation: acute illness or injury with systemic symptoms that poses a threat to life or bodily functions ? ?Amount and/or Complexity of Data Reviewed ?Independent Historian:  ?   Details: [employer], additional hx - indicating family ivcd ?External Data Reviewed: notes. ?Labs: ordered. Decision-making details documented in ED Course. ?Discussion of management or test interpretation with external provider(s): TTS/BH team consulted.  ? ?Risk ?OTC drugs. ?Prescription drug management. ?Decision regarding hospitalization. ? ? ?Iv ns. Continuous pulse ox and cardiac monitoring. Labs ordered/sent. Imaging ordered.  ? ?Reviewed nursing notes and prior charts for additional history. External reports reviewed. Additional history from: 67.  ? ?Cardiac monitor: sinus rhythm, rate ? ?Labs reviewed/interpreted by me - pending. ? ?CIWA protocol.  ? ?2315, labs pending - signed out to Dr Ralene Bathe to check labs when resulted, recheck pt, and f/u with tss eval. ? ?The patient has been placed in psychiatric observation due to the need to provide a safe environment for the patient while obtaining psychiatric consultation and evaluation, as well as ongoing medical and medication management to treat the patient's condition.  The patient has been placed under full IVC at this time. ? ?Family ivc'd pt. I completed first exam. Home meds ordered.  ? ? ? ? ? ? ? ? ? ?Final Clinical  Impression(s) / ED Diagnoses ?Final diagnoses:  ?None  ? ? ?Rx / DC Orders ?ED Discharge Orders   ? ? None  ? ?  ? ? ?  ?Lajean Saver, MD ?03/31/22 2317 ? ?

## 2022-04-01 LAB — CBC
HCT: 43.7 % (ref 39.0–52.0)
Hemoglobin: 15.5 g/dL (ref 13.0–17.0)
MCH: 32.8 pg (ref 26.0–34.0)
MCHC: 35.5 g/dL (ref 30.0–36.0)
MCV: 92.6 fL (ref 80.0–100.0)
Platelets: 148 10*3/uL — ABNORMAL LOW (ref 150–400)
RBC: 4.72 MIL/uL (ref 4.22–5.81)
RDW: 13.1 % (ref 11.5–15.5)
WBC: 5.1 10*3/uL (ref 4.0–10.5)
nRBC: 0 % (ref 0.0–0.2)

## 2022-04-01 LAB — COMPREHENSIVE METABOLIC PANEL
ALT: 34 U/L (ref 0–44)
AST: 83 U/L — ABNORMAL HIGH (ref 15–41)
Albumin: 4.2 g/dL (ref 3.5–5.0)
Alkaline Phosphatase: 48 U/L (ref 38–126)
Anion gap: 13 (ref 5–15)
BUN: <5 mg/dL — ABNORMAL LOW (ref 6–20)
CO2: 26 mmol/L (ref 22–32)
Calcium: 8.5 mg/dL — ABNORMAL LOW (ref 8.9–10.3)
Chloride: 103 mmol/L (ref 98–111)
Creatinine, Ser: 0.96 mg/dL (ref 0.61–1.24)
GFR, Estimated: >60 mL/min (ref >60)
Glucose, Bld: 101 mg/dL — ABNORMAL HIGH (ref 70–99)
Potassium: 3.3 mmol/L — ABNORMAL LOW (ref 3.5–5.1)
Sodium: 142 mmol/L (ref 135–145)
Total Bilirubin: 0.9 mg/dL (ref 0.3–1.2)
Total Protein: 6.4 g/dL — ABNORMAL LOW (ref 6.5–8.1)

## 2022-04-01 LAB — RAPID URINE DRUG SCREEN, HOSP PERFORMED
Amphetamines: NOT DETECTED
Barbiturates: NOT DETECTED
Benzodiazepines: POSITIVE — AB
Cocaine: NOT DETECTED
Opiates: NOT DETECTED
Tetrahydrocannabinol: NOT DETECTED

## 2022-04-01 LAB — RESP PANEL BY RT-PCR (FLU A&B, COVID) ARPGX2
Influenza A by PCR: NEGATIVE
Influenza B by PCR: NEGATIVE
SARS Coronavirus 2 by RT PCR: NEGATIVE

## 2022-04-01 LAB — ETHANOL: Alcohol, Ethyl (B): 343 mg/dL (ref <10)

## 2022-04-01 MED ORDER — LORAZEPAM 2 MG/ML IJ SOLN
2.0000 mg | Freq: Once | INTRAMUSCULAR | Status: AC
Start: 2022-04-01 — End: 2022-04-01
  Administered 2022-04-01: 2 mg via INTRAVENOUS
  Filled 2022-04-01: qty 1

## 2022-04-01 MED ORDER — ACETAMINOPHEN 325 MG PO TABS
650.0000 mg | ORAL_TABLET | Freq: Three times a day (TID) | ORAL | Status: DC | PRN
  Administered 2022-04-01 (×2): 650 mg via ORAL
  Filled 2022-04-01 (×2): qty 2

## 2022-04-01 MED ORDER — POTASSIUM CHLORIDE CRYS ER 20 MEQ PO TBCR
20.0000 meq | EXTENDED_RELEASE_TABLET | Freq: Once | ORAL | Status: AC
  Administered 2022-04-01: 20 meq via ORAL
  Filled 2022-04-01: qty 1

## 2022-04-01 NOTE — ED Notes (Signed)
Patient brought to bed 50, PIV in right forearm is removed. Patient wandering around unit, has tried to walk out x 2, sitter at bedside, patient is redirectable ?

## 2022-04-01 NOTE — ED Notes (Signed)
Pt finally agreed to change into blue scrubs on his own. Pt stated he is having a headache. This RN reached out to EDP for a PRN order for tylenol.  ?

## 2022-04-01 NOTE — ED Notes (Signed)
Pt currently sleeping. Resp even and unlabored. Skin pink and dry ?

## 2022-04-01 NOTE — ED Provider Notes (Signed)
Patient care assumed pending labs at 2300.  Patient under IVC, history of alcohol abuse.  CIWA protocol initiated.  Labs significant for elevated alcohol level, mild hypokalemia, mild thrombocytopenia.  Will replace potassium orally.  He has been medically cleared for psychiatric evaluation and treatment. ?  ?[name], MD ?04/01/22 [phone] ? ?

## 2022-04-01 NOTE — ED Notes (Signed)
Pt. Keeps trying to live unit, pt keep saying that he wants to walk around and I instruct him that he cant leave or be walking around, he has to stay in his room. Pt went to room ?

## 2022-04-01 NOTE — Progress Notes (Signed)
Per Ms. Smith, admissions, pt has been accepted to [facility]. Accepting provider is Alveda Reasons. Patient can arrive by 04/02/2022 after 8:00am. Number for report is [phone]. ? ?Crissie Reese, MSW, LCSW-A ?Phone: [phone] ?Disposition/TOC ? ? ?

## 2022-04-01 NOTE — ED Notes (Signed)
PT is calm and cooperative,Pt change out of his personal clothes into scrubs and he also had breakfast,   ?

## 2022-04-01 NOTE — ED Notes (Signed)
Patient upset that he cannot go back to [facility]. States he is paying for a room there and does not want to waste his money. Tried to explain that psychiatry has decided he needs to go to another facility.  ?

## 2022-04-01 NOTE — Progress Notes (Signed)
Patient has been fine and cooperative with me so far this shift, starting at 7pm. It was very chaotic on the [place] at shift change, he was appropriately irritable but easily redirectable. I did notice a lot of tremors that have not stopped so far but he has not complained about this symptom. He was only concerned with receiving his bedtime medication. It was very noisy on Unit but he did not want to turn off light or close door to minimize noise until after med pass. He did attempt to walk off of Unit and to intervene and "help" staff with other patients who were agitated but was easily redirected by [employer]. He also came out of room to just look in shower room, not to use it but just to check it out. He is now lying in bed, door closed, blinds open, light off and he did ask to leave the television on which was fine.  ?

## 2022-04-01 NOTE — ED Notes (Signed)
Patient continues to attempt to walk off the unit. ?

## 2022-04-01 NOTE — ED Notes (Signed)
[facility] This RN explained to the pt that they did not feel it was safe for him to go back and that they were looking for placement for him and that he would be going to a different facility tomorrow. Pt is starting to pace and become agitated that he cannot go back to [facility]. Will continue to monitor.  ?

## 2022-04-01 NOTE — Progress Notes (Signed)
Per Teodora Medici, patient meets criteria for inpatient treatment. There are no available or appropriate beds at [facility] today. CSW faxed referrals to the following facilities for review: ? ?[facility]  Pending - Request Sent N/A [address], [place] [address] [phone] --  ?[facility]  Pending - Request Sent N/A [address], [place] [address] [phone] --  ?[facility]  Pending - Request Sent N/A [address] [place] [address] [phone] --  ?[facility]  Pending - Request Sent N/A [address] [place] [address] [phone] --  ?[facility]  Pending - Request Sent N/A [address] [place] [address] [phone] --  ?[facility]  Pending - Request Sent N/A [address] [place] [address] [phone] --  ?[facility]  Pending - Request Sent N/A [address] [place], [place] [address] [phone] --  ?[facility]  Pending - Request Sent N/A [address], [place] [address] [phone] --  ?[facility]  Pending - Request Sent N/A [address] [place] [address] [phone] --  ?[facility]  Pending - Request Sent N/A [address] [place] [address] [phone] --  ?[facility]  Pending - Request Sent N/A [address] [place] [address] [phone] --  ?[facility]  Pending - Request Sent N/A [address] [place] [address] [phone] --  ?[facility]  Pending - Request Sent N/A [address] [address] [place] [address] [phone] --  ?[facility]  Pending - Request Sent N/A [address] [place] [address] [phone] --  ?[facility]  Pending - Request Sent N/A [address] [place] [address] [phone] --  ? ?TTS will continue to seek bed placement. ? ?Crissie Reese, MSW, LCSW-A, LCAS-A ?Phone: [phone] ?Disposition/TOC ? ?

## 2022-04-01 NOTE — ED Notes (Signed)
Pt refusing to change into hospital provided scrubs. This RN explained to the pt that he was IVC'd and did not have a choice. Pt stated he did not care he was not going to put them on. This RN explained to pt that she would have to call security and get them involved because once again pt did not have a choice. Security en route to bedside.  ?

## 2022-04-01 NOTE — ED Notes (Signed)
Pt currently sleeping and resting comfortably, unable to obtain VS at this time, will try a little later. Sitter remains at bedside.  ?

## 2022-04-02 ENCOUNTER — Encounter (HOSPITAL_COMMUNITY): Payer: 59

## 2022-04-04 ENCOUNTER — Encounter (HOSPITAL_COMMUNITY): Payer: 59

## 2022-04-06 ENCOUNTER — Encounter (HOSPITAL_COMMUNITY): Payer: 59

## 2022-05-04 ENCOUNTER — Emergency Department (HOSPITAL_COMMUNITY)
Admission: EM | Admit: 2022-05-04 | Discharge: 2022-05-04 | Disposition: A | Discharge status: Home / Self Care | Payer: Managed Care, Other (non HMO) | Attending: Emergency Medicine | Admitting: Emergency Medicine

## 2022-05-04 ENCOUNTER — Emergency Department (HOSPITAL_COMMUNITY): Payer: Managed Care, Other (non HMO)

## 2022-05-04 ENCOUNTER — Encounter (HOSPITAL_COMMUNITY): Payer: Self-pay

## 2022-05-04 ENCOUNTER — Other Ambulatory Visit: Payer: Self-pay

## 2022-05-04 DIAGNOSIS — F10920 Alcohol use, unspecified with intoxication, uncomplicated: Secondary | ICD-10-CM | POA: Diagnosis present

## 2022-05-04 DIAGNOSIS — R7309 Other abnormal glucose: Secondary | ICD-10-CM | POA: Diagnosis not present

## 2022-05-04 DIAGNOSIS — R4182 Altered mental status, unspecified: Secondary | ICD-10-CM | POA: Insufficient documentation

## 2022-05-04 DIAGNOSIS — Y908 Blood alcohol level of 240 mg/100 ml or more: Secondary | ICD-10-CM | POA: Diagnosis not present

## 2022-05-04 LAB — COMPREHENSIVE METABOLIC PANEL
ALT: 55 U/L — ABNORMAL HIGH (ref 0–44)
AST: 98 U/L — ABNORMAL HIGH (ref 15–41)
Albumin: 4.6 g/dL (ref 3.5–5.0)
Alkaline Phosphatase: 35 U/L — ABNORMAL LOW (ref 38–126)
Anion gap: 11 (ref 5–15)
BUN: 8 mg/dL (ref 6–20)
CO2: 23 mmol/L (ref 22–32)
Calcium: 8.3 mg/dL — ABNORMAL LOW (ref 8.9–10.3)
Chloride: 104 mmol/L (ref 98–111)
Creatinine, Ser: 0.97 mg/dL (ref 0.61–1.24)
GFR, Estimated: >60 mL/min (ref >60)
Glucose, Bld: 115 mg/dL — ABNORMAL HIGH (ref 70–99)
Potassium: 3.8 mmol/L (ref 3.5–5.1)
Sodium: 138 mmol/L (ref 135–145)
Total Bilirubin: 1 mg/dL (ref 0.3–1.2)
Total Protein: 7.6 g/dL (ref 6.5–8.1)

## 2022-05-04 LAB — LIPASE, BLOOD: Lipase: 61 U/L — ABNORMAL HIGH (ref 11–51)

## 2022-05-04 LAB — CBC WITH DIFFERENTIAL/PLATELET
Abs Immature Granulocytes: 0.02 10*3/uL (ref 0.00–0.07)
Basophils Absolute: 0.1 10*3/uL (ref 0.0–0.1)
Basophils Relative: 1 %
Eosinophils Absolute: 0.1 10*3/uL (ref 0.0–0.5)
Eosinophils Relative: 1 %
HCT: 49.5 % (ref 39.0–52.0)
Hemoglobin: 16.6 g/dL (ref 13.0–17.0)
Immature Granulocytes: 0 %
Lymphocytes Relative: 25 %
Lymphs Abs: 1.9 10*3/uL (ref 0.7–4.0)
MCH: 31.4 pg (ref 26.0–34.0)
MCHC: 33.5 g/dL (ref 30.0–36.0)
MCV: 93.8 fL (ref 80.0–100.0)
Monocytes Absolute: 0.6 10*3/uL (ref 0.1–1.0)
Monocytes Relative: 8 %
Neutro Abs: 5.1 10*3/uL (ref 1.7–7.7)
Neutrophils Relative %: 65 %
Platelets: 262 10*3/uL (ref 150–400)
RBC: 5.28 MIL/uL (ref 4.22–5.81)
RDW: 13.1 % (ref 11.5–15.5)
WBC: 7.8 10*3/uL (ref 4.0–10.5)
nRBC: 0 % (ref 0.0–0.2)

## 2022-05-04 LAB — ETHANOL: Alcohol, Ethyl (B): 511 mg/dL (ref <10)

## 2022-05-04 LAB — CBG MONITORING, ED: Glucose-Capillary: 105 mg/dL — ABNORMAL HIGH (ref 70–99)

## 2022-05-04 MED ORDER — LORAZEPAM 2 MG/ML IJ SOLN
1.0000 mg | Freq: Once | INTRAMUSCULAR | Status: AC
  Administered 2022-05-04: 1 mg via INTRAVENOUS
  Filled 2022-05-04: qty 1

## 2022-05-04 MED ORDER — CHLORDIAZEPOXIDE HCL 25 MG PO CAPS: ORAL_CAPSULE | ORAL | 0 refills | Status: DC

## 2022-05-04 NOTE — ED Notes (Signed)
Pt is too agitated to get an EKG right now ?

## 2022-05-04 NOTE — ED Notes (Signed)
Went into pt room to check on pt and pt was gone, Emergency planning/management officer in the hall stated that pt left and he could only follow him so far. Staff up front noted pt leaving and removed pt IV. Attempted to call pt friend Barbara Cower but no answer on call. ?

## 2022-05-04 NOTE — ED Notes (Signed)
Thayer [id number] ?

## 2022-05-04 NOTE — ED Triage Notes (Signed)
[facility] reports pt coming from [employer] bathroom after consuming 2 bottles of wine. Pt recently got out of rehab. ?

## 2022-05-04 NOTE — ED Notes (Signed)
Pt is on bed alarm ?

## 2022-05-04 NOTE — ED Notes (Signed)
Pt friend Corene Cornea [phone] advised when is is ready to home to given him a call and he will work on getting him a ride. ?

## 2022-05-04 NOTE — ED Provider Notes (Signed)
?[facility]-EMERGENCY DEPT ?Provider Note ? ? ?CSN: [id number] ?Arrival date & time: 05/04/22  4709 ? ?  ? ?History ? ?Chief Complaint  ?Patient presents with  ? Alcohol Intoxication  ? ? ?Keith Miller is a 51 y.o. male.  With past medical history of alcohol use disorder, suicidal ideations who presents to the emergency department with alcohol intoxication. ? ?Level 5 caveat: Altered mental status.  Patient was brought in by EMS after he was found in [employer] bathroom with 2 bottles of wine next to him.  Patient cannot provide any information to me.  He opens his eyes to verbal stimulation but is otherwise not answering questions. ? ? ?Alcohol Intoxication ? ? ?  ? ?Home Medications ?Prior to Admission medications   ?Medication Sig Start Date End Date Taking? Authorizing Provider  ?escitalopram (LEXAPRO) 10 MG tablet Take 1 tablet (10 mg total) by mouth daily. ?Patient not taking: Reported on 03/31/2022 03/08/22   Roselle Locus, MD  ?escitalopram (LEXAPRO) 20 MG tablet Take 20 mg by mouth daily.    [provider]  ?gabapentin (NEURONTIN) 100 MG capsule Take 1 capsule (100 mg total) by mouth 3 (three) times daily. ?Patient not taking: Reported on 03/31/2022 03/07/22   Roselle Locus, MD  ?hydrOXYzine (ATARAX) 25 MG tablet Take 1 tablet (25 mg total) by mouth every 6 (six) hours as needed for anxiety. ?Patient not taking: Reported on 03/31/2022 03/07/22   Roselle Locus, MD  ?Multiple Vitamins-Minerals (B COMPLEX-C-E-ZINC) tablet Take 1 tablet by mouth daily. ?Patient not taking: Reported on 03/31/2022 03/07/22   Roselle Locus, MD  ?naltrexone (DEPADE) 50 MG tablet Take 1 tablet (50 mg total) by mouth daily. ?Patient not taking: Reported on 03/31/2022 02/16/22   Phineas Inches, MD  ?traZODone (DESYREL) 50 MG tablet Take 1 tablet (50 mg total) by mouth at bedtime as needed for sleep. 02/16/22   Phineas Inches, MD  ?   ? ?Allergies    ?Patient has no known  allergies.   ? ?Review of Systems   ?Review of Systems  ?Unable to perform ROS: Mental status change  ? ?Physical Exam ?Updated Vital Signs ?BP 124/85 (BP Location: Left Arm)   Pulse 72   Temp 98.5 ?F (36.9 ?C) (Oral)   Resp 18   Ht 6' (1.829 m)   Wt 99.8 kg   SpO2 95%   BMI 29.84 kg/m?  ?Physical Exam ?Vitals and nursing note reviewed.  ?Constitutional:   ?   Appearance: Normal appearance. He is normal weight. He is ill-appearing.  ?HENT:  ?   Head: Normocephalic.  ?   Mouth/Throat:  ?   Mouth: Mucous membranes are dry.  ?   Pharynx: Oropharynx is clear.  ?Eyes:  ?   General: No scleral icterus. ?   Conjunctiva/sclera:  ?   Right eye: Right conjunctiva is injected.  ?   Left eye: Left conjunctiva is injected.  ?   Pupils: Pupils are equal, round, and reactive to light.  ?Cardiovascular:  ?   Rate and Rhythm: Normal rate and regular rhythm.  ?   Pulses: Normal pulses.  ?   Heart sounds: No murmur heard. ?Pulmonary:  ?   Effort: Pulmonary effort is normal. No respiratory distress.  ?   Breath sounds: Normal breath sounds.  ?Abdominal:  ?   General: Bowel sounds are normal. There is no distension.  ?   Palpations: Abdomen is soft.  ?   Tenderness: There is  no abdominal tenderness.  ?Musculoskeletal:     ?   General: Normal range of motion.  ?   Cervical back: Neck supple.  ?Skin: ?   General: Skin is warm and dry.  ?   Capillary Refill: Capillary refill takes less than 2 seconds.  ?   Coloration: Skin is not jaundiced.  ?Neurological:  ?   General: No focal deficit present.  ?   Mental Status: He is disoriented.  ?Psychiatric:     ?   Attention and Perception: He is inattentive.     ?   Speech: He is noncommunicative.     ?   Behavior: Behavior is slowed.     ?   Cognition and Memory: Cognition is impaired.     ?   Judgment: Judgment is impulsive.  ? ? ?ED Results / Procedures / Treatments   ?Labs ?(all labs ordered are listed, but only abnormal results are displayed) ?Labs Reviewed  ?COMPREHENSIVE METABOLIC  PANEL - Abnormal; Notable for the following components:  ?    Result Value  ? Glucose, Bld 115 (*)   ? Calcium 8.3 (*)   ? AST 98 (*)   ? ALT 55 (*)   ? Alkaline Phosphatase 35 (*)   ? All other components within normal limits  ?LIPASE, BLOOD - Abnormal; Notable for the following components:  ? Lipase 61 (*)   ? All other components within normal limits  ?ETHANOL - Abnormal; Notable for the following components:  ? Alcohol, Ethyl (B) 511 (*)   ? All other components within normal limits  ?CBG MONITORING, ED - Abnormal; Notable for the following components:  ? Glucose-Capillary 105 (*)   ? All other components within normal limits  ?CBC WITH DIFFERENTIAL/PLATELET  ?RAPID URINE DRUG SCREEN, HOSP PERFORMED  ? ?EKG ?None ? ?Radiology ?CT Head Wo Contrast ? ?Result Date: 05/04/2022 ?CLINICAL DATA:  Found down EXAM: CT HEAD WITHOUT CONTRAST CT CERVICAL SPINE WITHOUT CONTRAST TECHNIQUE: Multidetector CT imaging of the head and cervical spine was performed following the standard protocol without intravenous contrast. Multiplanar CT image reconstructions of the cervical spine were also generated. RADIATION DOSE REDUCTION: This exam was performed according to the departmental dose-optimization program which includes automated exposure control, adjustment of the mA and/or kV according to patient size and/or use of iterative reconstruction technique. COMPARISON:  Head CT 01/21/2022, cervical spine CT 12/05/2021 FINDINGS: CT HEAD FINDINGS Brain: There is no acute intracranial hemorrhage, extra-axial fluid collection, or acute infarct Parenchymal volume is normal. The ventricles are normal in size. Gray-white differentiation is preserved. There is no mass lesion.  There is no mass effect or midline shift. Vascular: No hyperdense vessel or unexpected calcification. Skull: Normal. Negative for fracture or focal lesion. Sinuses/Orbits: There is mild mucosal thickening in the paranasal sinuses. The globes and orbits are unremarkable.  Other: None. CT CERVICAL SPINE FINDINGS Alignment: Normal. There is no antero or retrolisthesis. There is no jumped or perched facets or other evidence of traumatic malalignment. Skull base and vertebrae: Skull base alignment is maintained. Vertebral body heights are preserved. There is no evidence of acute fracture. Soft tissues and spinal canal: No prevertebral fluid or swelling. No visible canal hematoma. Disc levels: Disc space narrowing degenerative endplate changes most advanced at C4-C5 and C6-C7. There is multilevel facet arthropathy. There is no high-grade spinal canal stenosis. There is bilateral neural foraminal stenosis most advanced at C4-C5 and C6-C7. Upper chest: The imaged lung apices are clear. Other: None. IMPRESSION: 1. No  acute intracranial pathology. 2. No acute fracture or traumatic malalignment of the cervical spine. Electronically Signed   By: Lesia HausenPeter  Noone M.D.   On: 05/04/2022 09:05  ? ?CT Cervical Spine Wo Contrast ? ?Result Date: 05/04/2022 ?CLINICAL DATA:  Found down EXAM: CT HEAD WITHOUT CONTRAST CT CERVICAL SPINE WITHOUT CONTRAST TECHNIQUE: Multidetector CT imaging of the head and cervical spine was performed following the standard protocol without intravenous contrast. Multiplanar CT image reconstructions of the cervical spine were also generated. RADIATION DOSE REDUCTION: This exam was performed according to the departmental dose-optimization program which includes automated exposure control, adjustment of the mA and/or kV according to patient size and/or use of iterative reconstruction technique. COMPARISON:  Head CT 01/21/2022, cervical spine CT 12/05/2021 FINDINGS: CT HEAD FINDINGS Brain: There is no acute intracranial hemorrhage, extra-axial fluid collection, or acute infarct Parenchymal volume is normal. The ventricles are normal in size. Gray-white differentiation is preserved. There is no mass lesion.  There is no mass effect or midline shift. Vascular: No hyperdense vessel or  unexpected calcification. Skull: Normal. Negative for fracture or focal lesion. Sinuses/Orbits: There is mild mucosal thickening in the paranasal sinuses. The globes and orbits are unremarkable. Other: None. CT CERVICAL S

## 2022-05-04 NOTE — ED Provider Notes (Signed)
Care assumed from [place], [place] at shift change, please see their note for full detail, but in brief Keith Miller is a 51 y.o. male presents with alcohol intoxication. ? ?Plan: Plan to d/c after patient patient is able to sit up and eat without assistance. ? ?Physical Exam  ?BP 111/87   Pulse 91   Temp 98.5 ?F (36.9 ?C) (Oral)   Resp 16   Ht 6' (1.829 m)   Wt 99.8 kg   SpO2 95%   BMI 29.84 kg/m?  ? ?Physical Exam ? ?Procedures  ?Procedures ? ?ED Course / MDM  ?  ?Medical Decision Making ?Amount and/or Complexity of Data Reviewed ?Labs: ordered. ?Radiology: ordered. ? ?Risk ?Prescription drug management. ? ? ?RN notified me that patient ran out of his room after eating sandwich prior to my reassessment. Staff were able to stop him long enough to remove IV.  Reassuring that he was ambulatory and eating without difficulty.  ? ? ? ? ?  ?Janell Quiet, [place] ?05/04/22 1623 ? ?  ?Bethann Berkshire, MD ?05/06/22 1129 ? ?

## 2022-05-04 NOTE — ED Notes (Signed)
Pt given turkey sandwich and water

## 2022-05-04 NOTE — ED Notes (Signed)
Security notified that the patient left out of the ED Lobby doors. ?

## 2022-05-04 NOTE — Discharge Instructions (Addendum)
You were seen in the emergency department today for alcohol intoxication. I have sent a Librium taper to your pharmacy to help you abstain from alcohol. I have also provided you with substance abuse resource guides for local centers who help individuals with alcohol and substance abuse.  ?

## 2022-05-04 NOTE — ED Notes (Signed)
Pt trying to get off CT table during CT, brought back to room and getting out of the bed, not stable on his feet, security at bedside and attempting to redirect pt back to the bed. Provider notified. ?

## 2022-05-17 ENCOUNTER — Emergency Department (HOSPITAL_COMMUNITY)
Admission: EM | Admit: 2022-05-17 | Discharge: 2022-05-17 | Disposition: A | Discharge status: Home / Self Care | Payer: Managed Care, Other (non HMO) | Attending: Emergency Medicine | Admitting: Emergency Medicine

## 2022-05-17 ENCOUNTER — Other Ambulatory Visit: Payer: Self-pay

## 2022-05-17 DIAGNOSIS — F10920 Alcohol use, unspecified with intoxication, uncomplicated: Secondary | ICD-10-CM | POA: Insufficient documentation

## 2022-05-17 DIAGNOSIS — Y908 Blood alcohol level of 240 mg/100 ml or more: Secondary | ICD-10-CM | POA: Insufficient documentation

## 2022-05-17 DIAGNOSIS — W19XXXA Unspecified fall, initial encounter: Secondary | ICD-10-CM | POA: Insufficient documentation

## 2022-05-17 DIAGNOSIS — F10929 Alcohol use, unspecified with intoxication, unspecified: Secondary | ICD-10-CM

## 2022-05-17 DIAGNOSIS — R000 Tachycardia, unspecified: Secondary | ICD-10-CM | POA: Insufficient documentation

## 2022-05-17 DIAGNOSIS — S80812A Abrasion, left lower leg, initial encounter: Secondary | ICD-10-CM | POA: Insufficient documentation

## 2022-05-17 DIAGNOSIS — Z79899 Other long term (current) drug therapy: Secondary | ICD-10-CM | POA: Insufficient documentation

## 2022-05-17 DIAGNOSIS — F32A Depression, unspecified: Secondary | ICD-10-CM | POA: Insufficient documentation

## 2022-05-17 DIAGNOSIS — S20419A Abrasion of unspecified back wall of thorax, initial encounter: Secondary | ICD-10-CM | POA: Insufficient documentation

## 2022-05-17 LAB — COMPREHENSIVE METABOLIC PANEL
ALT: 64 U/L — ABNORMAL HIGH (ref 0–44)
AST: 112 U/L — ABNORMAL HIGH (ref 15–41)
Albumin: 4.5 g/dL (ref 3.5–5.0)
Alkaline Phosphatase: 32 U/L — ABNORMAL LOW (ref 38–126)
Anion gap: 14 (ref 5–15)
BUN: 7 mg/dL (ref 6–20)
CO2: 21 mmol/L — ABNORMAL LOW (ref 22–32)
Calcium: 8.4 mg/dL — ABNORMAL LOW (ref 8.9–10.3)
Chloride: 109 mmol/L (ref 98–111)
Creatinine, Ser: 1.09 mg/dL (ref 0.61–1.24)
GFR, Estimated: >60 mL/min (ref >60)
Glucose, Bld: 110 mg/dL — ABNORMAL HIGH (ref 70–99)
Potassium: 4.1 mmol/L (ref 3.5–5.1)
Sodium: 144 mmol/L (ref 135–145)
Total Bilirubin: 1 mg/dL (ref 0.3–1.2)
Total Protein: 7.1 g/dL (ref 6.5–8.1)

## 2022-05-17 LAB — CBC
HCT: 44.7 % (ref 39.0–52.0)
Hemoglobin: 15.1 g/dL (ref 13.0–17.0)
MCH: 31.3 pg (ref 26.0–34.0)
MCHC: 33.8 g/dL (ref 30.0–36.0)
MCV: 92.5 fL (ref 80.0–100.0)
Platelets: 146 10*3/uL — ABNORMAL LOW (ref 150–400)
RBC: 4.83 MIL/uL (ref 4.22–5.81)
RDW: 13.2 % (ref 11.5–15.5)
WBC: 7 10*3/uL (ref 4.0–10.5)
nRBC: 0 % (ref 0.0–0.2)

## 2022-05-17 LAB — RAPID URINE DRUG SCREEN, HOSP PERFORMED
Amphetamines: NOT DETECTED
Barbiturates: NOT DETECTED
Benzodiazepines: NOT DETECTED
Cocaine: NOT DETECTED
Opiates: NOT DETECTED
Tetrahydrocannabinol: NOT DETECTED

## 2022-05-17 LAB — AMMONIA: Ammonia: 21 umol/L (ref 9–35)

## 2022-05-17 LAB — ETHANOL: Alcohol, Ethyl (B): 469 mg/dL (ref <10)

## 2022-05-17 MED ORDER — LACTATED RINGERS IV BOLUS
1000.0000 mL | Freq: Once | INTRAVENOUS | Status: AC
  Administered 2022-05-17: 1000 mL via INTRAVENOUS

## 2022-05-17 MED ORDER — LORAZEPAM 2 MG/ML IJ SOLN
1.0000 mg | Freq: Once | INTRAMUSCULAR | Status: AC
  Administered 2022-05-17: 1 mg via INTRAVENOUS
  Filled 2022-05-17: qty 1

## 2022-05-17 MED ORDER — FOLIC ACID 1 MG PO TABS: 1.0000 mg | ORAL_TABLET | Freq: Once | ORAL | Status: DC

## 2022-05-17 MED ORDER — THIAMINE HCL 100 MG/ML IJ SOLN: 100.0000 mg | Freq: Every day | INTRAMUSCULAR | Status: DC

## 2022-05-17 NOTE — ED Provider Notes (Signed)
[facility] EMERGENCY DEPARTMENT Provider Note   CSN: [id number] Arrival date & time: 05/17/22  1435     History  Chief Complaint  Patient presents with   Alcohol Intoxication    Keith Miller is a 51 y.o. male.  HPI 51 year old male brought in by [employer] under IVC.  IVC papers are under media tab.  The papers family members note that the patient has been drinking almost nonstop.  He has lost his home and they feel that he is depressed.  He has been falling and fell into his 84 year old father who he lives with.  They feel that he is a danger to himself and others.  Patient does not have any insight and wishes to leave.    Home Medications Prior to Admission medications   Medication Sig Start Date End Date Taking? Authorizing Provider  chlordiazePOXIDE (LIBRIUM) 25 MG capsule 50mg  PO TID x 1D, then 25-50mg  PO BID X 1D, then 25-50mg  PO QD X 1D 05/04/22   07/04/22 E, PA-C  escitalopram (LEXAPRO) 10 MG tablet Take 1 tablet (10 mg total) by mouth daily. Patient not taking: Reported on 03/31/2022 03/08/22   05/08/22, MD  escitalopram (LEXAPRO) 20 MG tablet Take 20 mg by mouth daily.    [provider]  gabapentin (NEURONTIN) 100 MG capsule Take 1 capsule (100 mg total) by mouth 3 (three) times daily. Patient not taking: Reported on 03/31/2022 03/07/22   05/07/22, MD  hydrOXYzine (ATARAX) 25 MG tablet Take 1 tablet (25 mg total) by mouth every 6 (six) hours as needed for anxiety. Patient not taking: Reported on 03/31/2022 03/07/22   05/07/22, MD  Multiple Vitamins-Minerals (B COMPLEX-C-E-ZINC) tablet Take 1 tablet by mouth daily. Patient not taking: Reported on 03/31/2022 03/07/22   05/07/22, MD  naltrexone (DEPADE) 50 MG tablet Take 1 tablet (50 mg total) by mouth daily. Patient not taking: Reported on 03/31/2022 02/16/22   02/18/22, MD  traZODone (DESYREL) 50 MG tablet Take 1 tablet (50 mg total) by mouth at  bedtime as needed for sleep. 02/16/22   02/18/22, MD      Allergies    Patient has no known allergies.    Review of Systems   Review of Systems  Physical Exam Updated Vital Signs BP (!) 147/91 (BP Location: Right Arm)   Pulse 100   Temp 98.3 F (36.8 C) (Oral)   Resp 18   SpO2 94%  Physical Exam Vitals and nursing note reviewed.  Constitutional:      Appearance: Normal appearance.  HENT:     Head: Normocephalic.     Right Ear: External ear normal.     Left Ear: External ear normal.     Nose: Nose normal.     Mouth/Throat:     Pharynx: Oropharynx is clear.  Eyes:     Pupils: Pupils are equal, round, and reactive to light.  Cardiovascular:     Rate and Rhythm: Regular rhythm. Tachycardia present.     Pulses: Normal pulses.  Pulmonary:     Effort: Pulmonary effort is normal.  Abdominal:     General: Abdomen is flat.  Musculoskeletal:        General: Normal range of motion.     Cervical back: Normal range of motion.     Comments: Abrasions left lower leg Abrasion to back  Skin:    General: Skin is warm.     Capillary Refill: Capillary  refill takes less than 2 seconds.  Neurological:     Mental Status: He is alert.     Cranial Nerves: No cranial nerve deficit.     Gait: Gait abnormal.  Psychiatric:        Attention and Perception: He is inattentive.        Mood and Affect: Affect is labile.        Behavior: Behavior is agitated.        Thought Content: Thought content does not include homicidal ideation.        Cognition and Memory: Memory is impaired.     Comments: Patient appears acutely intoxicated    ED Results / Procedures / Treatments   Labs (all labs ordered are listed, but only abnormal results are displayed) Labs Reviewed  CBC - Abnormal; Notable for the following components:      Result Value   Platelets 146 (*)    All other components within normal limits  ETHANOL - Abnormal; Notable for the following components:   Alcohol, Ethyl (B)  469 (*)    All other components within normal limits  RAPID URINE DRUG SCREEN, HOSP PERFORMED  AMMONIA  COMPREHENSIVE METABOLIC PANEL    EKG None  Radiology No results found.  Procedures Procedures    Medications Ordered in ED Medications  folic acid (FOLVITE) tablet 1 mg (has no administration in time range)  thiamine (B-1) injection 100 mg (has no administration in time range)  lactated ringers bolus 1,000 mL (1,000 mLs Intravenous New Bag/Given 05/17/22 1538)  lactated ringers bolus 1,000 mL (1,000 mLs Intravenous New Bag/Given 05/17/22 1539)  LORazepam (ATIVAN) injection 1 mg (1 mg Intravenous Given 05/17/22 1539)    ED Course/ Medical Decision Making/ A&P Clinical Course as of 05/17/22 1622  Thu May 17, 2022  1549 Rapid urine drug screen (hospital performed) Urine drug screen reviewed and interpreted no evidence of opiates, cocaine, benzos, amphetamines, THC, or perpetuates are noted [DR]    Clinical Course User Index [DR] Margarita Grizzle, MD                           Medical Decision Making 51 year old male presents today via police with IVC papers.  Family states that he has been drinking without stopping for several months.  They concern for concern for his wellbeing and family members.  He is living with his 84 year old father. Here he appears acutely intoxicated.  He is somewhat tremulous.  Blood pressure is 147/91 heart rate is 100 Patient is being evaluated for acute intoxication versus withdrawal vs other intoxicants Patient will need to clinically sober and have evaluation of psychiatric status Dr. Estell Harpin is aware and has assumed care  Amount and/or Complexity of Data Reviewed Labs: ordered. Decision-making details documented in ED Course.  Risk Prescription drug management.           Final Clinical Impression(s) / ED Diagnoses Final diagnoses:  Alcoholic intoxication with complication [facility])    Rx / DC Orders ED Discharge Orders     None          Margarita Grizzle, MD 05/17/22 1622

## 2022-05-17 NOTE — ED Notes (Signed)
RN updated Selena Batten, sister. Dr. Estell Harpin rescinded IVC paperwork. Selena Batten states it isnt safe for pt to go home and pt is at risk for elderly father. Dr states pt can go home. Pt left without dc vs

## 2022-05-17 NOTE — Discharge Instructions (Signed)
Follow-up at behavioral health if you want to stop drinking

## 2022-05-17 NOTE — ED Notes (Signed)
Pt refuses to stay in room, Dr. Roderic Palau to room to assess pt, rescinded IVC, pt refuses VS and discharge papers.  Pt escorted to lobby.

## 2022-05-17 NOTE — ED Triage Notes (Signed)
Pt BIB EMS due to ETOH. Pt is depressed. Denies SI. [employer] brings pt for involuntary comit from family. Family states pt is not safe. Pt is very intoxicated.

## 2022-05-17 NOTE — ED Provider Notes (Signed)
Patient stated that he wanted to go home.  He was alert oriented x4 ambulated without falling.  Patient stated he did not want to hurt himself or anybody else.  His IVC papers will be rescinded and he will be discharged   Bethann Berkshire, MD 05/17/22 1731

## 2022-05-18 ENCOUNTER — Inpatient Hospital Stay (HOSPITAL_COMMUNITY)
Admission: EM | Admit: 2022-05-18 | Discharge: 2022-05-22 | DRG: 897 | Disposition: A | Discharge status: Other Acute Inpatient Hospital | Payer: No Payment, Other | Attending: Internal Medicine | Admitting: Internal Medicine

## 2022-05-18 ENCOUNTER — Observation Stay (HOSPITAL_COMMUNITY): Payer: PRIVATE HEALTH INSURANCE

## 2022-05-18 ENCOUNTER — Encounter (HOSPITAL_COMMUNITY): Payer: Self-pay | Admitting: Emergency Medicine

## 2022-05-18 ENCOUNTER — Inpatient Hospital Stay (HOSPITAL_COMMUNITY)
Admission: EM | Admit: 2022-05-18 | Discharge: 2022-05-18 | DRG: 894 | Discharge status: Against Medical Advice | Payer: Federal, State, Local not specified - Other | Attending: Internal Medicine | Admitting: Internal Medicine

## 2022-05-18 DIAGNOSIS — L719 Rosacea, unspecified: Secondary | ICD-10-CM | POA: Diagnosis present

## 2022-05-18 DIAGNOSIS — F10229 Alcohol dependence with intoxication, unspecified: Secondary | ICD-10-CM | POA: Diagnosis present

## 2022-05-18 DIAGNOSIS — E876 Hypokalemia: Secondary | ICD-10-CM | POA: Diagnosis present

## 2022-05-18 DIAGNOSIS — F10231 Alcohol dependence with withdrawal delirium: Principal | ICD-10-CM | POA: Diagnosis present

## 2022-05-18 DIAGNOSIS — F332 Major depressive disorder, recurrent severe without psychotic features: Secondary | ICD-10-CM | POA: Diagnosis present

## 2022-05-18 DIAGNOSIS — F1994 Other psychoactive substance use, unspecified with psychoactive substance-induced mood disorder: Secondary | ICD-10-CM | POA: Diagnosis present

## 2022-05-18 DIAGNOSIS — F419 Anxiety disorder, unspecified: Secondary | ICD-10-CM | POA: Diagnosis present

## 2022-05-18 DIAGNOSIS — K209 Esophagitis, unspecified without bleeding: Secondary | ICD-10-CM | POA: Diagnosis present

## 2022-05-18 DIAGNOSIS — Y908 Blood alcohol level of 240 mg/100 ml or more: Secondary | ICD-10-CM | POA: Diagnosis present

## 2022-05-18 DIAGNOSIS — R45851 Suicidal ideations: Secondary | ICD-10-CM | POA: Diagnosis present

## 2022-05-18 DIAGNOSIS — Z818 Family history of other mental and behavioral disorders: Secondary | ICD-10-CM

## 2022-05-18 DIAGNOSIS — F10139 Alcohol abuse with withdrawal, unspecified: Principal | ICD-10-CM | POA: Diagnosis present

## 2022-05-18 DIAGNOSIS — F10939 Alcohol use, unspecified with withdrawal, unspecified: Principal | ICD-10-CM | POA: Diagnosis present

## 2022-05-18 DIAGNOSIS — Z79899 Other long term (current) drug therapy: Secondary | ICD-10-CM

## 2022-05-18 DIAGNOSIS — Z781 Physical restraint status: Secondary | ICD-10-CM

## 2022-05-18 DIAGNOSIS — T148XXA Other injury of unspecified body region, initial encounter: Secondary | ICD-10-CM | POA: Diagnosis present

## 2022-05-18 DIAGNOSIS — D696 Thrombocytopenia, unspecified: Secondary | ICD-10-CM | POA: Diagnosis present

## 2022-05-18 DIAGNOSIS — F339 Major depressive disorder, recurrent, unspecified: Secondary | ICD-10-CM | POA: Diagnosis present

## 2022-05-18 DIAGNOSIS — F10929 Alcohol use, unspecified with intoxication, unspecified: Secondary | ICD-10-CM | POA: Diagnosis present

## 2022-05-18 DIAGNOSIS — Z20822 Contact with and (suspected) exposure to covid-19: Secondary | ICD-10-CM | POA: Diagnosis present

## 2022-05-18 DIAGNOSIS — R7989 Other specified abnormal findings of blood chemistry: Secondary | ICD-10-CM | POA: Diagnosis present

## 2022-05-18 DIAGNOSIS — W19XXXA Unspecified fall, initial encounter: Secondary | ICD-10-CM | POA: Diagnosis present

## 2022-05-18 DIAGNOSIS — E512 Wernicke's encephalopathy: Secondary | ICD-10-CM | POA: Diagnosis present

## 2022-05-18 DIAGNOSIS — Z79891 Long term (current) use of opiate analgesic: Secondary | ICD-10-CM

## 2022-05-18 DIAGNOSIS — F1020 Alcohol dependence, uncomplicated: Secondary | ICD-10-CM | POA: Diagnosis present

## 2022-05-18 DIAGNOSIS — R079 Chest pain, unspecified: Secondary | ICD-10-CM | POA: Diagnosis present

## 2022-05-18 LAB — COMPREHENSIVE METABOLIC PANEL
ALT: 57 U/L — ABNORMAL HIGH (ref 0–44)
AST: 131 U/L — ABNORMAL HIGH (ref 15–41)
Albumin: 4.2 g/dL (ref 3.5–5.0)
Alkaline Phosphatase: 31 U/L — ABNORMAL LOW (ref 38–126)
Anion gap: 10 (ref 5–15)
BUN: 8 mg/dL (ref 6–20)
CO2: 24 mmol/L (ref 22–32)
Calcium: 7.9 mg/dL — ABNORMAL LOW (ref 8.9–10.3)
Chloride: 109 mmol/L (ref 98–111)
Creatinine, Ser: 0.96 mg/dL (ref 0.61–1.24)
GFR, Estimated: >60 mL/min (ref >60)
Glucose, Bld: 110 mg/dL — ABNORMAL HIGH (ref 70–99)
Potassium: 3.5 mmol/L (ref 3.5–5.1)
Sodium: 143 mmol/L (ref 135–145)
Total Bilirubin: 0.9 mg/dL (ref 0.3–1.2)
Total Protein: 6.7 g/dL (ref 6.5–8.1)

## 2022-05-18 LAB — URINALYSIS, ROUTINE W REFLEX MICROSCOPIC
Bilirubin Urine: NEGATIVE
Glucose, UA: NEGATIVE mg/dL
Hgb urine dipstick: NEGATIVE
Ketones, ur: 5 mg/dL — AB
Leukocytes,Ua: NEGATIVE
Nitrite: NEGATIVE
Protein, ur: NEGATIVE mg/dL
Specific Gravity, Urine: 1.015 (ref 1.005–1.030)
pH: 7 (ref 5.0–8.0)

## 2022-05-18 LAB — CBC WITH DIFFERENTIAL/PLATELET
Abs Immature Granulocytes: 0.03 10*3/uL (ref 0.00–0.07)
Basophils Absolute: 0 10*3/uL (ref 0.0–0.1)
Basophils Relative: 0 %
Eosinophils Absolute: 0 10*3/uL (ref 0.0–0.5)
Eosinophils Relative: 0 %
HCT: 38.5 % — ABNORMAL LOW (ref 39.0–52.0)
Hemoglobin: 13.5 g/dL (ref 13.0–17.0)
Immature Granulocytes: 0 %
Lymphocytes Relative: 7 %
Lymphs Abs: 0.6 10*3/uL — ABNORMAL LOW (ref 0.7–4.0)
MCH: 32.4 pg (ref 26.0–34.0)
MCHC: 35.1 g/dL (ref 30.0–36.0)
MCV: 92.3 fL (ref 80.0–100.0)
Monocytes Absolute: 0.5 10*3/uL (ref 0.1–1.0)
Monocytes Relative: 6 %
Neutro Abs: 7 10*3/uL (ref 1.7–7.7)
Neutrophils Relative %: 87 %
Platelets: 118 10*3/uL — ABNORMAL LOW (ref 150–400)
RBC: 4.17 MIL/uL — ABNORMAL LOW (ref 4.22–5.81)
RDW: 13.4 % (ref 11.5–15.5)
WBC: 8.1 10*3/uL (ref 4.0–10.5)
nRBC: 0 % (ref 0.0–0.2)

## 2022-05-18 LAB — LIPASE, BLOOD: Lipase: 76 U/L — ABNORMAL HIGH (ref 11–51)

## 2022-05-18 LAB — PHOSPHORUS: Phosphorus: 3.7 mg/dL (ref 2.5–4.6)

## 2022-05-18 LAB — ETHANOL: Alcohol, Ethyl (B): 313 mg/dL (ref <10)

## 2022-05-18 LAB — MAGNESIUM: Magnesium: 2 mg/dL (ref 1.7–2.4)

## 2022-05-18 MED ORDER — ADULT MULTIVITAMIN W/MINERALS CH
1.0000 | ORAL_TABLET | Freq: Every day | ORAL | Status: DC
  Filled 2022-05-18: qty 1

## 2022-05-18 MED ORDER — ONDANSETRON HCL 4 MG PO TABS
4.0000 mg | ORAL_TABLET | Freq: Four times a day (QID) | ORAL | Status: DC | PRN
  Administered 2022-05-18: 4 mg via ORAL
  Filled 2022-05-18: qty 1

## 2022-05-18 MED ORDER — MAGNESIUM SULFATE 2 GM/50ML IV SOLN
2.0000 g | Freq: Once | INTRAVENOUS | Status: AC
  Administered 2022-05-18: 2 g via INTRAVENOUS
  Filled 2022-05-18: qty 50

## 2022-05-18 MED ORDER — THIAMINE HCL 100 MG PO TABS
500.0000 mg | ORAL_TABLET | Freq: Three times a day (TID) | ORAL | Status: DC
  Filled 2022-05-18: qty 5

## 2022-05-18 MED ORDER — LORAZEPAM 2 MG/ML IJ SOLN
2.0000 mg | Freq: Once | INTRAMUSCULAR | Status: AC | PRN
  Administered 2022-05-18: 2 mg via INTRAVENOUS
  Filled 2022-05-18: qty 1

## 2022-05-18 MED ORDER — LORAZEPAM 2 MG/ML IJ SOLN
2.0000 mg | Freq: Once | INTRAMUSCULAR | Status: AC
  Administered 2022-05-18: 2 mg via INTRAVENOUS
  Filled 2022-05-18: qty 1

## 2022-05-18 MED ORDER — THIAMINE HCL 100 MG/ML IJ SOLN: 500.0000 mg | INTRAMUSCULAR | Status: DC

## 2022-05-18 MED ORDER — FOLIC ACID 1 MG PO TABS
1.0000 mg | ORAL_TABLET | Freq: Every day | ORAL | Status: DC
  Filled 2022-05-18: qty 1

## 2022-05-18 MED ORDER — LORAZEPAM 2 MG/ML IJ SOLN
0.0000 mg | INTRAMUSCULAR | Status: AC
  Administered 2022-05-18 – 2022-05-19 (×4): 2 mg via INTRAVENOUS
  Administered 2022-05-19: 1 mg via INTRAVENOUS
  Administered 2022-05-19: 2 mg via INTRAVENOUS
  Administered 2022-05-20 (×2): 3 mg via INTRAVENOUS
  Administered 2022-05-20: 2 mg via INTRAVENOUS
  Filled 2022-05-18 (×5): qty 1
  Filled 2022-05-18: qty 2
  Filled 2022-05-18: qty 1
  Filled 2022-05-18: qty 2

## 2022-05-18 MED ORDER — B COMPLEX-C PO TABS
1.0000 | ORAL_TABLET | Freq: Every day | ORAL | Status: DC
  Filled 2022-05-18: qty 1

## 2022-05-18 MED ORDER — LORAZEPAM 1 MG PO TABS: 1.0000 mg | ORAL_TABLET | ORAL | Status: DC | PRN

## 2022-05-18 MED ORDER — LORAZEPAM 2 MG/ML IJ SOLN
1.0000 mg | INTRAMUSCULAR | Status: AC | PRN
  Administered 2022-05-18 – 2022-05-19 (×2): 2 mg via INTRAVENOUS
  Administered 2022-05-20: 3 mg via INTRAVENOUS
  Administered 2022-05-20: 2 mg via INTRAVENOUS
  Administered 2022-05-20: 3 mg via INTRAVENOUS
  Administered 2022-05-21 (×3): 2 mg via INTRAVENOUS
  Filled 2022-05-18: qty 2
  Filled 2022-05-18 (×3): qty 1
  Filled 2022-05-18: qty 2
  Filled 2022-05-18 (×4): qty 1

## 2022-05-18 MED ORDER — THIAMINE HCL 100 MG/ML IJ SOLN
250.0000 mg | Freq: Two times a day (BID) | INTRAVENOUS | Status: DC
  Filled 2022-05-18: qty 2.5

## 2022-05-18 MED ORDER — LORAZEPAM 2 MG/ML IJ SOLN: 0.0000 mg | Freq: Three times a day (TID) | INTRAMUSCULAR | Status: DC

## 2022-05-18 MED ORDER — ONDANSETRON HCL 4 MG PO TABS: 4.0000 mg | ORAL_TABLET | Freq: Four times a day (QID) | ORAL | Status: DC | PRN

## 2022-05-18 MED ORDER — LORAZEPAM 2 MG/ML IJ SOLN: 0.0000 mg | INTRAMUSCULAR | Status: DC

## 2022-05-18 MED ORDER — LORAZEPAM 2 MG/ML IJ SOLN: 1.0000 mg | INTRAMUSCULAR | Status: DC | PRN

## 2022-05-18 MED ORDER — ONDANSETRON HCL 4 MG/2ML IJ SOLN: 4.0000 mg | Freq: Four times a day (QID) | INTRAMUSCULAR | Status: DC | PRN

## 2022-05-18 MED ORDER — THIAMINE HCL 100 MG/ML IJ SOLN: 100.0000 mg | Freq: Once | INTRAMUSCULAR | Status: DC

## 2022-05-18 MED ORDER — THIAMINE HCL 100 MG/ML IJ SOLN
500.0000 mg | Freq: Two times a day (BID) | INTRAVENOUS | Status: AC
  Administered 2022-05-19 – 2022-05-20 (×4): 500 mg via INTRAVENOUS
  Filled 2022-05-18 (×4): qty 5

## 2022-05-18 MED ORDER — LORAZEPAM 1 MG PO TABS
2.0000 mg | ORAL_TABLET | Freq: Once | ORAL | Status: AC
  Administered 2022-05-18: 2 mg via ORAL
  Filled 2022-05-18: qty 2

## 2022-05-18 MED ORDER — LORAZEPAM 1 MG PO TABS
1.0000 mg | ORAL_TABLET | ORAL | Status: AC | PRN
  Administered 2022-05-19: 2 mg via ORAL
  Administered 2022-05-20 – 2022-05-21 (×2): 1 mg via ORAL
  Filled 2022-05-18: qty 1
  Filled 2022-05-18: qty 2
  Filled 2022-05-18: qty 1

## 2022-05-18 MED ORDER — HALOPERIDOL LACTATE 5 MG/ML IJ SOLN: 5.0000 mg | Freq: Four times a day (QID) | INTRAMUSCULAR | Status: DC | PRN

## 2022-05-18 MED ORDER — LORAZEPAM 2 MG/ML IJ SOLN
0.0000 mg | Freq: Three times a day (TID) | INTRAMUSCULAR | Status: DC
  Administered 2022-05-21: 3 mg via INTRAVENOUS
  Filled 2022-05-18 (×2): qty 2

## 2022-05-18 MED ORDER — KCL IN DEXTROSE-NACL 20-5-0.9 MEQ/L-%-% IV SOLN
INTRAVENOUS | Status: DC
  Filled 2022-05-18 (×3): qty 1000

## 2022-05-18 MED ORDER — THIAMINE HCL 100 MG/ML IJ SOLN
500.0000 mg | Freq: Three times a day (TID) | INTRAVENOUS | Status: DC
  Filled 2022-05-18 (×3): qty 5

## 2022-05-18 MED ORDER — ONDANSETRON HCL 4 MG/2ML IJ SOLN
4.0000 mg | Freq: Four times a day (QID) | INTRAMUSCULAR | Status: DC | PRN
Start: 2022-05-18 — End: 2022-05-22
  Filled 2022-05-18 (×2): qty 2

## 2022-05-18 MED ORDER — THIAMINE HCL 100 MG/ML IJ SOLN: 250.0000 mg | Freq: Three times a day (TID) | INTRAVENOUS | Status: DC

## 2022-05-18 NOTE — ED Triage Notes (Signed)
Pt arrives via [employer] for IVC after leaving ED multiple times through ambulance bay.

## 2022-05-18 NOTE — ED Notes (Signed)
Patient walked out of the ED despite staff attempts to get him to come back. MD made aware.

## 2022-05-18 NOTE — ED Notes (Signed)
Pts O2 dropped to 76% put pt on 2L of O2

## 2022-05-18 NOTE — Progress Notes (Signed)
[facility] admitting physician addendum:  The patient apparently urinated on the chair near the sitter who prompted the sitter to leave his room momentarily, while the patient promptly left through the ED ambulance bay.  Dr.Kommor filed IVC for the patient due to his current mental state with possible Warnicke cephalopathy.  The [facility] Department was called and asked to look for the patient.  The admission process has been canceled.  Date Robb Matar, MD.

## 2022-05-18 NOTE — ED Triage Notes (Signed)
Patient presents with complaints of withdrawals. He reports nausea, abdominal pain and tremors. He had 2 bottles of wine yesterday. EMS administered 500 ml of fluid and 4 mg of Zofran.   EMS vitals: 144/84 BP 20 RR 104 HR 121 CBG 98 % SPO2 on room air

## 2022-05-18 NOTE — ED Notes (Signed)
Patient transported to CT 

## 2022-05-18 NOTE — ED Notes (Signed)
Patient noted to be ambulating in parking lot. Patient retrieved with security assistance. Patient changed back into gown. Belongings secured at nurse's station in labeled patient belongings bag.

## 2022-05-18 NOTE — ED Triage Notes (Signed)
Patient arriving via [employer] d/t ETOH use and IVC. Pt was here earlier and eloped.

## 2022-05-18 NOTE — H&P (Signed)
History and Physical    Patient: Keith Miller [id number] DOB: 11-Aug-1971 DOA: 05/18/2022 DOS: the patient was seen and examined on 05/18/2022 PCP: Shon Baton, MD  Patient coming from: Home  Chief Complaint:  Chief Complaint  Patient presents with   IVC   HPI: Keith Miller is a 51 y.o. male with medical history significant of EtOH abuse, depression, anxiety, suicidal ideations history of substance-induced mood disorder, overweight who presented to the emergency department yesterday for involuntary commitment by family, but subsequently discharged AMA as he was fully oriented, admitted to being depressed but denies suicidal ideations.  He came back this morning BIBEMS with complaints of nausea, abdominal pain, and tremors in the setting of alcohol withdrawal.  Then, the patient apparently urinated on the chair near the sitter who prompted the sitter to leave his room momentarily, while the patient promptly left through the ED ambulance bay.  Dr.Kommor filed IVC for the patient due to his current mental state with possible Warnicke cephalopathy and he was found by the police, who brought him back to the emergency department.  He was found with 2 almost empty bottles of wine by the GDP.  He is oriented x2, but disoriented to time and date, except for the year, which she was able to recall after a while with some difficulty.  Somnolent, disoriented to month, day of the month, day of the week, time of the day and current situation.  He denied having headache, chest, back or abdominal pain when asked.  ED course: Initial vital signs were temperature 98.4 F, pulse 73, respiration 20, BP 131/90 mmHg and O2 sat 98% on room air.  The patient received lorazepam 2 mg IVP x2 earlier and IV fluid bolus.    Lab work: His CBC showed a white count of 8.1, hemoglobin 13.5 g/dL and platelets 118.  Lipase was 76 units/L.  Alcohol level was 313 mg/dL.  CMP showed normal electrolytes except for a calcium of  7.9 mg/dL.  Glucose 110 mg/dL.  Normal renal function.  LFTs with a total protein of 6.7, albumin 4.2 g/dL.  Alkaline phosphatase 31, AST 131 and ALT 57 units/L.  Bilirubin was normal.  Imaging: CT head without contrast was ordered given level of intoxication, but is still pending.   Review of Systems: As mentioned in the history of present illness. All other systems reviewed and are negative.  Past Medical History:  Diagnosis Date   ETOH abuse    Past Surgical History:  Procedure Laterality Date   ANKLE SURGERY     KNEE SURGERY     Social History:  reports that he has never smoked. He has never used smokeless tobacco. He reports current alcohol use. He reports that he does not currently use drugs.  No Known Allergies  No family history on file.  Prior to Admission medications   Medication Sig Start Date End Date Taking? Authorizing Provider  chlordiazePOXIDE (LIBRIUM) 25 MG capsule 50mg  PO TID x 1D, then 25-50mg  PO BID X 1D, then 25-50mg  PO QD X 1D 05/04/22   Theodis Blaze E, PA-C  escitalopram (LEXAPRO) 10 MG tablet Take 1 tablet (10 mg total) by mouth daily. Patient not taking: Reported on 03/31/2022 03/08/22   Maida Sale, MD  escitalopram (LEXAPRO) 20 MG tablet Take 20 mg by mouth daily.    [provider]  gabapentin (NEURONTIN) 100 MG capsule Take 1 capsule (100 mg total) by mouth 3 (three) times daily. Patient not taking: Reported on 03/31/2022  03/07/22   Maida Sale, MD  hydrOXYzine (ATARAX) 25 MG tablet Take 1 tablet (25 mg total) by mouth every 6 (six) hours as needed for anxiety. Patient not taking: Reported on 03/31/2022 03/07/22   Maida Sale, MD  Multiple Vitamins-Minerals (B COMPLEX-C-E-ZINC) tablet Take 1 tablet by mouth daily. Patient not taking: Reported on 03/31/2022 03/07/22   Maida Sale, MD  naltrexone (DEPADE) 50 MG tablet Take 1 tablet (50 mg total) by mouth daily. Patient not taking: Reported on 03/31/2022 02/16/22   Janine Limbo, MD  traZODone (DESYREL) 50 MG tablet Take 1 tablet (50 mg total) by mouth at bedtime as needed for sleep. 02/16/22   Janine Limbo, MD    Physical Exam: Vitals:   05/18/22 1640  BP: 131/90  Pulse: (!) 103  Resp: 20  Temp: 98.4 F (36.9 C)  TempSrc: Oral  SpO2: 98%   Physical Exam Vitals and nursing note reviewed.  Constitutional:      Appearance: Normal appearance.  HENT:     Head: Normocephalic.     Mouth/Throat:     Mouth: Mucous membranes are moist.  Eyes:     General: No scleral icterus.    Pupils: Pupils are equal, round, and reactive to light.  Neck:     Vascular: No JVD.  Cardiovascular:     Rate and Rhythm: Regular rhythm. Tachycardia present.     Heart sounds: S1 normal and S2 normal.  Pulmonary:     Effort: Pulmonary effort is normal. No respiratory distress.     Breath sounds: No wheezing or rales.  Abdominal:     General: Bowel sounds are normal. There is no distension.     Palpations: Abdomen is soft.     Tenderness: There is no abdominal tenderness. There is no guarding.  Musculoskeletal:     Cervical back: Neck supple.     Right lower leg: No edema.     Left lower leg: No edema.  Skin:    General: Skin is warm and dry.     Findings: Bruising present.     Comments: Right hand for MTP area fresh abrasion. Left pretibial area healing abrasions.  Neurological:     General: No focal deficit present.     Mental Status: He is alert. He is disoriented.    Data Reviewed:  There are no new results to review at this time.  Assessment and Plan: Principal Problem:   Alcohol withdrawal ([place]) Has being IVCed. Observation/PCU. One-to-one monitoring. Magnesium sulfate given earlier. Folate and MVI supplementation. High-dose thiamine due to Warnicke's suspicion. Begin lorazepam therapy by CIWA protocol. Haloperidol 5 mg IVP every 6 hours as needed. Consult transitional care team.  Active Problems:   MDD (major depressive disorder),   recurrent episode, severe ([place]) Has not complied with treatment or follow-up. Psychiatry has been consulted to evaluate once more alert.    Advance Care Planning:   Code Status: Full code.  Consults:   Family Communication:   Severity of Illness: The appropriate patient status for this patient is OBSERVATION. Observation status is judged to be reasonable and necessary in order to provide the required intensity of service to ensure the patient's safety. The patient's presenting symptoms, physical exam findings, and initial radiographic and laboratory data in the context of their medical condition is felt to place them at decreased risk for further clinical deterioration. Furthermore, it is anticipated that the patient will be medically stable for discharge from the hospital within 2 midnights of  admission.   Author: Reubin Milan, MD 05/18/2022 5:30 PM  For on call review [email or web address].   This document was prepared using Dragon voice recognition software and may contain some unintended transcription errors.

## 2022-05-18 NOTE — ED Notes (Signed)
Patient attempted to elope twice. He is now back and wanting help.

## 2022-05-18 NOTE — Progress Notes (Signed)
Went to do pt's nursing admission history. Did what I could from information in the computer. He has recently been brought in after eloping from ER. Staff ask that pt be left alone at this time. Unable to complete nursing admission hx at current time.  Briscoe Burns BSN, RN-BC Admissions Nurse / Discharge Review Nurse 05/18/2022 5:47 PM

## 2022-05-18 NOTE — ED Provider Notes (Signed)
[facility]-EMERGENCY DEPT Provider Note  CSN: [id number] Arrival date & time: 05/18/22 1106  Chief Complaint(s) No chief complaint on file.  HPI Keith Miller is a 51 y.o. male with PMH alcohol abuse, alcohol withdrawal who presents emergency department for evaluation of alcohol withdrawal.  Patient arrives via EMS and immediately upon arrival is attempting to remove his IVs and is asking to leave.  He is alert and oriented answering all questions appropriately but does appear to be in withdrawal and is tremulous.  He will answer no additional questions.   Past Medical History Past Medical History:  Diagnosis Date   ETOH abuse    Patient Active Problem List   Diagnosis Date Noted   MDD (major depressive disorder), recurrent episode (HCC) 03/05/2022   Alcohol use disorder, severe, dependence (HCC) 02/14/2022   MDD (major depressive disorder), recurrent episode, severe (HCC) 02/13/2022   Substance induced mood disorder (HCC)    Suicidal ideation    Alcohol withdrawal syndrome with complication, with unspecified complication (HCC) 01/02/2022   Alcohol withdrawal (HCC) 01/02/2022   Anxiety disorder 05/22/2016   Pain in right elbow 05/22/2016   Rosacea 05/22/2016   Home Medication(s) Prior to Admission medications   Medication Sig Start Date End Date Taking? Authorizing Provider  chlordiazePOXIDE (LIBRIUM) 25 MG capsule 50mg  PO TID x 1D, then 25-50mg  PO BID X 1D, then 25-50mg  PO QD X 1D 05/04/22   07/04/22 E, PA-C  escitalopram (LEXAPRO) 10 MG tablet Take 1 tablet (10 mg total) by mouth daily. Patient not taking: Reported on 03/31/2022 03/08/22   05/08/22, MD  escitalopram (LEXAPRO) 20 MG tablet Take 20 mg by mouth daily.    [provider]  gabapentin (NEURONTIN) 100 MG capsule Take 1 capsule (100 mg total) by mouth 3 (three) times daily. Patient not taking: Reported on 03/31/2022 03/07/22   05/07/22, MD  hydrOXYzine (ATARAX) 25  MG tablet Take 1 tablet (25 mg total) by mouth every 6 (six) hours as needed for anxiety. Patient not taking: Reported on 03/31/2022 03/07/22   05/07/22, MD  Multiple Vitamins-Minerals (B COMPLEX-C-E-ZINC) tablet Take 1 tablet by mouth daily. Patient not taking: Reported on 03/31/2022 03/07/22   05/07/22, MD  naltrexone (DEPADE) 50 MG tablet Take 1 tablet (50 mg total) by mouth daily. Patient not taking: Reported on 03/31/2022 02/16/22   02/18/22, MD  traZODone (DESYREL) 50 MG tablet Take 1 tablet (50 mg total) by mouth at bedtime as needed for sleep. 02/16/22   02/18/22, MD                                                                                                                                    Past Surgical History Past Surgical History:  Procedure Laterality Date   ANKLE SURGERY     KNEE SURGERY     Family History No family history  on file.  Social History Social History   Tobacco Use   Smoking status: Never   Smokeless tobacco: Never  Vaping Use   Vaping Use: Never used  Substance Use Topics   Alcohol use: Yes    Comment: 3-4 half pints of vodka QD   Drug use: Not Currently   Allergies Patient has no known allergies.  Review of Systems Review of Systems  Neurological:  Positive for tremors.   Physical Exam Vital Signs  I have reviewed the triage vital signs There were no vitals taken for this visit.  Physical Exam Constitutional:      General: He is not in acute distress.    Appearance: Normal appearance.  HENT:     Head: Normocephalic and atraumatic.     Nose: No congestion or rhinorrhea.  Eyes:     General:        Right eye: No discharge.        Left eye: No discharge.     Extraocular Movements: Extraocular movements intact.     Pupils: Pupils are equal, round, and reactive to light.  Cardiovascular:     Rate and Rhythm: Normal rate and regular rhythm.     Heart sounds: No murmur heard. Pulmonary:     Effort:  No respiratory distress.     Breath sounds: No wheezing or rales.  Abdominal:     General: There is no distension.     Tenderness: There is no abdominal tenderness.  Musculoskeletal:        General: Normal range of motion.     Cervical back: Normal range of motion.  Skin:    General: Skin is warm and dry.  Neurological:     General: No focal deficit present.     Mental Status: He is alert.     Comments: Tremulous    ED Results and Treatments Labs (all labs ordered are listed, but only abnormal results are displayed) Labs Reviewed  COMPREHENSIVE METABOLIC PANEL  CBC WITH DIFFERENTIAL/PLATELET  LIPASE, BLOOD  URINALYSIS, ROUTINE W REFLEX MICROSCOPIC  ETHANOL                                                                                                                          Radiology No results found.  Pertinent labs & imaging results that were available during my care of the patient were reviewed by me and considered in my medical decision making (see MDM for details).  Medications Ordered in ED Medications  LORazepam (ATIVAN) injection 2 mg (has no administration in time range)  LORazepam (ATIVAN) tablet 2 mg (2 mg Oral Given 05/18/22 1124)  Procedures .Critical Care Performed by: Glendora Score, MD Authorized by: Glendora Score, MD   Critical care provider statement:    Critical care time (minutes):  30   Critical care was time spent personally by me on the following activities:  Development of treatment plan with patient or surrogate, discussions with consultants, evaluation of patient's response to treatment, examination of patient, ordering and review of laboratory studies, ordering and review of radiographic studies, ordering and performing treatments and interventions, pulse oximetry, re-evaluation of patient's condition and review  of old charts  (including critical care time)  Medical Decision Making / ED Course   This patient presents to the ED for concern of alcohol withdrawal, this involves an extensive number of treatment options, and is a complaint that carries with it a high risk of complications and morbidity.  The differential diagnosis includes alcohol withdrawal, polysubstance use, electrolyte abnormality  MDM: Patient seen emergency room for evaluation of alcohol withdrawal.  Immediately upon arrival patient appears tremulous, but is asking to leave the emergency department.  He states that he called the emergency EMS "because he needs help".  I was able to redirect the patient into his room and unfortunately the patient had removed his IV.  Thus, the patient and I used shared decision-making and he agreed to take oral Ativan for his tremors.  I had a long discussion with the patient about my desire for him to stay in the emergency department and obtain treatment for alcohol withdrawal as this can be dangerous.  The patient initially agreed to stay for medical evaluation, and immediately upon me leaving the room the patient took the Ativan, and eloped out of the emergency department.  Fortunately, the patient return to the emergency department and agreed to continue his treatment.  Patient received an additional 2 mg of IV Ativan laboratory evaluation shows thrombocytopenia to 118, AST 131, ALT 57 consistent with his alcohol use and an alcohol level of 313.  Lipase elevated at 76.  Patient then admitted to the hospital service.  The patient then apparently urinated on the chair near the sitter causing the sitter to leave the room.  Upon leaving the room, the patient then eloped out of the emergency department.  Upon hearing this, I filed an IVC for the patient due to his current intoxication and concern for his underlying mental status and possible Warnicke's encephalopathy given short-term memory deficits on my  evaluation.  The police were then sent to find the patient.  (This patient was ultimately found and brought back to the emergency department.  Please see additional note for reevaluation on return to the emergency department)  Additional history obtained: -Additional history obtained from significant other and sister -External records from outside source obtained and reviewed including: Chart review including previous notes, labs, imaging, consultation notes   Lab Tests: -I ordered, reviewed, and interpreted labs.   The pertinent results include:   Labs Reviewed  COMPREHENSIVE METABOLIC PANEL  CBC WITH DIFFERENTIAL/PLATELET  LIPASE, BLOOD  URINALYSIS, ROUTINE W REFLEX MICROSCOPIC  ETHANOL      Medicines ordered and prescription drug management: Meds ordered this encounter  Medications   LORazepam (ATIVAN) tablet 2 mg   LORazepam (ATIVAN) injection 2 mg    -I have reviewed the patients home medicines and have made adjustments as needed  Critical interventions Alcohol withdrawal treatment, multiple interventions with redirection, filing of a involuntary commitment order    Cardiac Monitoring: The patient was maintained on a cardiac monitor.  I personally  viewed and interpreted the cardiac monitored which showed an underlying rhythm of: Sinus tachycardia  Social Determinants of Health:  Factors impacting patients care include: Daily alcohol use   Reevaluation: After the interventions noted above, I reevaluated the patient and found that they have :stayed the same  Co morbidities that complicate the patient evaluation  Past Medical History:  Diagnosis Date   ETOH abuse       Dispostion: I considered admission for this patient, and due to persistent memory deficits and current alcohol withdrawal patient will require admission to the hospital     Final Clinical Impression(s) / ED Diagnoses Final diagnoses:  None     @PCDICTATION @    Glendora ScoreKommor, Emmy Keng,  MD 05/18/22 1645

## 2022-05-18 NOTE — ED Provider Notes (Signed)
[facility]-EMERGENCY DEPT Provider Note  CSN: [id number] Arrival date & time: 05/18/22 1554  Chief Complaint(s) IVC  HPI Elie ConferScott H Quesnell is a 51 y.o. male who returns to the emergency department by police after eloping from the emergency department with an IVC in place.  The patient was found at a local movie theater with 2 bottles of wine being drunk simultaneously.  Police then remove the alcohol from the patient's position and brought the patient to the emergency department.  On arrival, the patient appears more intoxicated and this is not unexpected given the patient has 4 mg of Ativan on board prior to his elopement from the emergency department.  Unfortunately, it was reported that the patient fell and struck his head while intoxicated.  There is no external signs of trauma.   Past Medical History Past Medical History:  Diagnosis Date   ETOH abuse    Patient Active Problem List   Diagnosis Date Noted   Alcohol intoxication (HCC) 05/18/2022   MDD (major depressive disorder), recurrent episode (HCC) 03/05/2022   Alcohol use disorder, severe, dependence (HCC) 02/14/2022   MDD (major depressive disorder), recurrent episode, severe (HCC) 02/13/2022   Substance induced mood disorder (HCC)    Suicidal ideation    Alcohol withdrawal syndrome with complication, with unspecified complication (HCC) 01/02/2022   Alcohol withdrawal (HCC) 01/02/2022   Anxiety disorder 05/22/2016   Pain in right elbow 05/22/2016   Rosacea 05/22/2016   Home Medication(s) Prior to Admission medications   Medication Sig Start Date End Date Taking? Authorizing Provider  chlordiazePOXIDE (LIBRIUM) 25 MG capsule 50mg  PO TID x 1D, then 25-50mg  PO BID X 1D, then 25-50mg  PO QD X 1D 05/04/22   Mertha BaarsAutry, Lauren E, PA-C  escitalopram (LEXAPRO) 10 MG tablet Take 1 tablet (10 mg total) by mouth daily. Patient not taking: Reported on 03/31/2022 03/08/22   Roselle LocusHill, Stephanie Leigh, MD  escitalopram (LEXAPRO) 20  MG tablet Take 20 mg by mouth daily.    [provider]  gabapentin (NEURONTIN) 100 MG capsule Take 1 capsule (100 mg total) by mouth 3 (three) times daily. Patient not taking: Reported on 03/31/2022 03/07/22   Roselle LocusHill, Stephanie Leigh, MD  hydrOXYzine (ATARAX) 25 MG tablet Take 1 tablet (25 mg total) by mouth every 6 (six) hours as needed for anxiety. Patient not taking: Reported on 03/31/2022 03/07/22   Roselle LocusHill, Stephanie Leigh, MD  Multiple Vitamins-Minerals (B COMPLEX-C-E-ZINC) tablet Take 1 tablet by mouth daily. Patient not taking: Reported on 03/31/2022 03/07/22   Roselle LocusHill, Stephanie Leigh, MD  naltrexone (DEPADE) 50 MG tablet Take 1 tablet (50 mg total) by mouth daily. Patient not taking: Reported on 03/31/2022 02/16/22   Phineas InchesMassengill, Nathan, MD  traZODone (DESYREL) 50 MG tablet Take 1 tablet (50 mg total) by mouth at bedtime as needed for sleep. 02/16/22   Phineas InchesMassengill, Nathan, MD  Past Surgical History Past Surgical History:  Procedure Laterality Date   ANKLE SURGERY     KNEE SURGERY     Family History No family history on file.  Social History Social History   Tobacco Use   Smoking status: Never   Smokeless tobacco: Never  Vaping Use   Vaping Use: Never used  Substance Use Topics   Alcohol use: Yes    Comment: 3-4 half pints of vodka QD   Drug use: Not Currently   Allergies Patient has no known allergies.  Review of Systems Review of Systems  Psychiatric/Behavioral:  Positive for confusion.    Physical Exam Vital Signs  I have reviewed the triage vital signs BP 131/90 (BP Location: Left Arm)   Pulse (!) 103   Temp 98.4 F (36.9 C) (Oral)   Resp 20   SpO2 98%   Physical Exam Constitutional:      General: He is not in acute distress.    Appearance: Normal appearance. He is ill-appearing.  HENT:     Head: Normocephalic and atraumatic.     Nose:  No congestion or rhinorrhea.  Eyes:     General:        Right eye: No discharge.        Left eye: No discharge.     Extraocular Movements: Extraocular movements intact.     Pupils: Pupils are equal, round, and reactive to light.  Cardiovascular:     Rate and Rhythm: Regular rhythm. Tachycardia present.     Heart sounds: No murmur heard. Pulmonary:     Effort: No respiratory distress.     Breath sounds: No wheezing or rales.  Abdominal:     General: There is no distension.     Tenderness: There is no abdominal tenderness.  Musculoskeletal:        General: Normal range of motion.     Cervical back: Normal range of motion.  Skin:    General: Skin is warm and dry.  Neurological:     General: No focal deficit present.     Mental Status: He is alert.     Cranial Nerves: No cranial nerve deficit.     Sensory: No sensory deficit.     Motor: No weakness.    ED Results and Treatments Labs (all labs ordered are listed, but only abnormal results are displayed) Labs Reviewed - No data to display                                                                                                                        Radiology No results found.  Pertinent labs & imaging results that were available during my care of the patient were reviewed by me and considered in my medical decision making (see MDM for details).  Medications Ordered in ED Medications  LORazepam (ATIVAN) injection 2 mg (2 mg Intravenous Given 05/18/22 1725)  Procedures Procedures  (including critical care time)  Medical Decision Making / ED Course   This patient presents to the ED for concern of alcohol withdrawal and fall, this involves an extensive number of treatment options, and is a complaint that carries with it a high risk of complications and morbidity.  The differential diagnosis  includes alcohol withdrawal, alcohol intoxication, subdural, skull fracture  MDM: Patient seen in the emergency department for evaluation of alcohol withdrawal and subsequent elopement from the emergency department while IVC in place.  Physical exam is unchanged from previous note but the patient does appear more intoxicated.  There is no external signs of head trauma.  CT head ordered and I spoke with the previous admitting hospitalist who agreed to readmit him to his service with no additional work-up needed from the ER.  Patient then admitted.   Additional history obtained: -Additional history obtained from police -External records from outside source obtained and reviewed including: Chart review including previous notes, labs, imaging, consultation notes   Imaging Studies ordered: I ordered imaging studies including CTH, which is pending    Medicines ordered and prescription drug management: Meds ordered this encounter  Medications   LORazepam (ATIVAN) injection 2 mg    -I have reviewed the patients home medicines and have made adjustments as needed  Critical interventions none   Cardiac Monitoring: The patient was maintained on a cardiac monitor.  I personally viewed and interpreted the cardiac monitored which showed an underlying rhythm of: Sinus tachycardia   Social Determinants of Health:  Factors impacting patients care include: Polysubstance abuse, current IVC   Reevaluation: After the interventions noted above, I reevaluated the patient and found that they have :stayed the same  Co morbidities that complicate the patient evaluation  Past Medical History:  Diagnosis Date   ETOH abuse       Dispostion: I considered admission for this patient, and given current alcohol withdrawal and reintoxication, patient will require admission     Final Clinical Impression(s) / ED Diagnoses Final diagnoses:  None     @PCDICTATION @    , MD 05/18/22  1737

## 2022-05-18 NOTE — ED Notes (Signed)
Pt pulled out IV and urinated in the urinal and on the floor.

## 2022-05-19 DIAGNOSIS — F332 Major depressive disorder, recurrent severe without psychotic features: Secondary | ICD-10-CM | POA: Diagnosis not present

## 2022-05-19 DIAGNOSIS — F10939 Alcohol use, unspecified with withdrawal, unspecified: Secondary | ICD-10-CM | POA: Diagnosis not present

## 2022-05-19 LAB — COMPREHENSIVE METABOLIC PANEL
ALT: 52 U/L — ABNORMAL HIGH (ref 0–44)
AST: 106 U/L — ABNORMAL HIGH (ref 15–41)
Albumin: 4 g/dL (ref 3.5–5.0)
Alkaline Phosphatase: 33 U/L — ABNORMAL LOW (ref 38–126)
Anion gap: 10 (ref 5–15)
BUN: 12 mg/dL (ref 6–20)
CO2: 25 mmol/L (ref 22–32)
Calcium: 8.3 mg/dL — ABNORMAL LOW (ref 8.9–10.3)
Chloride: 107 mmol/L (ref 98–111)
Creatinine, Ser: 1 mg/dL (ref 0.61–1.24)
GFR, Estimated: >60 mL/min (ref >60)
Glucose, Bld: 107 mg/dL — ABNORMAL HIGH (ref 70–99)
Potassium: 3.6 mmol/L (ref 3.5–5.1)
Sodium: 142 mmol/L (ref 135–145)
Total Bilirubin: 1.1 mg/dL (ref 0.3–1.2)
Total Protein: 6.5 g/dL (ref 6.5–8.1)

## 2022-05-19 LAB — CBC
HCT: 37.8 % — ABNORMAL LOW (ref 39.0–52.0)
Hemoglobin: 13.2 g/dL (ref 13.0–17.0)
MCH: 32.9 pg (ref 26.0–34.0)
MCHC: 34.9 g/dL (ref 30.0–36.0)
MCV: 94.3 fL (ref 80.0–100.0)
Platelets: 82 10*3/uL — ABNORMAL LOW (ref 150–400)
RBC: 4.01 MIL/uL — ABNORMAL LOW (ref 4.22–5.81)
RDW: 13.2 % (ref 11.5–15.5)
WBC: 6.7 10*3/uL (ref 4.0–10.5)
nRBC: 0 % (ref 0.0–0.2)

## 2022-05-19 LAB — PHOSPHORUS: Phosphorus: 3.7 mg/dL (ref 2.5–4.6)

## 2022-05-19 MED ORDER — ADULT MULTIVITAMIN W/MINERALS CH
1.0000 | ORAL_TABLET | Freq: Every day | ORAL | Status: DC
  Administered 2022-05-19 – 2022-05-22 (×4): 1 via ORAL
  Filled 2022-05-19 (×4): qty 1

## 2022-05-19 MED ORDER — FOLIC ACID 1 MG PO TABS
1.0000 mg | ORAL_TABLET | Freq: Every day | ORAL | Status: DC
  Administered 2022-05-19 – 2022-05-22 (×4): 1 mg via ORAL
  Filled 2022-05-19 (×4): qty 1

## 2022-05-19 NOTE — Progress Notes (Signed)
Pt belongings locked in the 4th floor SI cabinet per policy (located on the west side nurses station).

## 2022-05-19 NOTE — Consult Note (Signed)
[facility] Face-to-Face Psychiatry Consult   Reason for Consult:  depression, alcohol dependence  Referring Physician:  D. Robb Matar, MD Patient Identification: Keith Miller MRN:  [id number] Principal Diagnosis: Alcohol withdrawal (HCC) Diagnosis:  Principal Problem:   Alcohol withdrawal (HCC) Active Problems:   MDD (major depressive disorder), recurrent episode, severe (HCC)   Total Time spent with patient:  0 minitues  Subjective:   51 y.o. male with medical history significant of EtOH abuse, depression, anxiety, suicidal ideations, history of substance-induced mood disorder presented with nausea, abdominal pain, tremors in the setting of alcohol withdrawal.  In the emergency room, he was placed under IVC by ED provider because of his mental status with possibility of Wernicke encephalopathy.  He was started on IV fluids and CIWA protocol.  Psychiatry has been consulted.  CT of the head was negative for acute intracranial abnormality.  HPI:  Pt was asleep upon my arrival. I spoke with nurse, he sleeps most of the time, awakening for a few minute segments, to ask if he can leave, then returns to sleep. I will return for full assessment tomorrow when he is more alert and can participate in psychiatric assessment.   Past Psychiatric History: will obtain tomorrow  Risk to Self:   Risk to Others:   Prior Inpatient Therapy:   Prior Outpatient Therapy:    Past Medical History:  Past Medical History:  Diagnosis Date   ETOH abuse     Past Surgical History:  Procedure Laterality Date   ANKLE SURGERY     KNEE SURGERY     Family History: No family history on file. Family Psychiatric  History: will obtain tomorrow Social History:  Social History   Substance and Sexual Activity  Alcohol Use Yes   Comment: 3-4 half pints of vodka QD     Social History   Substance and Sexual Activity  Drug Use Not Currently    Social History   Socioeconomic History   Marital status: Significant Other     Spouse name: Not on file   Number of children: Not on file   Years of education: Not on file   Highest education level: Not on file  Occupational History   Not on file  Tobacco Use   Smoking status: Never   Smokeless tobacco: Never  Vaping Use   Vaping Use: Never used  Substance and Sexual Activity   Alcohol use: Yes    Comment: 3-4 half pints of vodka QD   Drug use: Not Currently   Sexual activity: Yes    Birth control/protection: None  Other Topics Concern   Not on file  Social History Narrative   Not on file   Social Determinants of Health   Financial Resource Strain: Not on file  Food Insecurity: Not on file  Transportation Needs: Not on file  Physical Activity: Not on file  Stress: Not on file  Social Connections: Not on file   Additional Social History:    Allergies:  No Known Allergies  Labs:  Results for orders placed or performed during the hospital encounter of 05/18/22 (from the past 48 hour(s))  Phosphorus     Status: None   Collection Time: 05/19/22  5:37 AM  Result Value Ref Range   Phosphorus 3.7 2.5 - 4.6 mg/dL    Comment: Performed at [facility], [address] [place], [place] [address]  CBC     Status: Abnormal   Collection Time: 05/19/22  5:37 AM  Result Value Ref  Range   WBC 6.7 4.0 - 10.5 K/uL   RBC 4.01 (L) 4.22 - 5.81 MIL/uL   Hemoglobin 13.2 13.0 - 17.0 g/dL   HCT 16.137.8 (L) 09.639.0 - 04.552.0 %   MCV 94.3 80.0 - 100.0 fL   MCH 32.9 26.0 - 34.0 pg   MCHC 34.9 30.0 - 36.0 g/dL   RDW 40.913.2 81.111.5 - 91.415.5 %   Platelets 82 (L) 150 - 400 K/uL    Comment: Immature Platelet Fraction may be clinically indicated, consider ordering this additional test [id number]    nRBC 0.0 0.0 - 0.2 %    Comment: Performed at [facility], [address] [place], [place] [address]  Comprehensive metabolic panel     Status: Abnormal   Collection Time: 05/19/22  5:37 AM  Result Value Ref Range   Sodium 142 135 - 145 mmol/L    Potassium 3.6 3.5 - 5.1 mmol/L   Chloride 107 98 - 111 mmol/L   CO2 25 22 - 32 mmol/L   Glucose, Bld 107 (H) 70 - 99 mg/dL    Comment: Glucose reference range applies only to samples taken after fasting for at least 8 hours.   BUN 12 6 - 20 mg/dL   Creatinine, Ser 7.841.00 0.61 - 1.24 mg/dL   Calcium 8.3 (L) 8.9 - 10.3 mg/dL   Total Protein 6.5 6.5 - 8.1 g/dL   Albumin 4.0 3.5 - 5.0 g/dL   AST 696106 (H) 15 - 41 U/L   ALT 52 (H) 0 - 44 U/L   Alkaline Phosphatase 33 (L) 38 - 126 U/L   Total Bilirubin 1.1 0.3 - 1.2 mg/dL   GFR, Estimated >29>60 >52>60 mL/min    Comment: (NOTE) Calculated using the CKD-EPI Creatinine Equation (2021)    Anion gap 10 5 - 15    Comment: Performed at [facility], [address] [place], [place] [address]    Current Facility-Administered Medications  Medication Dose Route Frequency Provider Last Rate Last Admin   dextrose 5 % and 0.9 % NaCl with KCl 20 mEq/L infusion   Intravenous Continuous Glade LloydAlekh, Kshitiz, MD 75 mL/hr at 05/19/22 1246 Rate Change at 05/19/22 1246   folic acid (FOLVITE) tablet 1 mg  1 mg Oral Daily Alekh, Kshitiz, MD   1 mg at 05/19/22 1340   haloperidol lactate (HALDOL) injection 5 mg  5 mg Intravenous Q6H PRN Bobette Mortiz, David Manuel, MD       LORazepam (ATIVAN) injection 0-4 mg  0-4 mg Intravenous Q4H Bobette Mortiz, David Manuel, MD   2 mg at 05/19/22 1340   Followed by   Melene Muller[START ON 05/20/2022] LORazepam (ATIVAN) injection 0-4 mg  0-4 mg Intravenous Q8H Bobette Mortiz, David Manuel, MD       LORazepam (ATIVAN) tablet 1-4 mg  1-4 mg Oral Q1H PRN Bobette Mortiz, David Manuel, MD       Or   LORazepam (ATIVAN) injection 1-4 mg  1-4 mg Intravenous Q1H PRN Bobette Mortiz, David Manuel, MD   2 mg at 05/19/22 [id number]   multivitamin with minerals tablet 1 tablet  1 tablet Oral Daily Glade LloydAlekh, Kshitiz, MD   1 tablet at 05/19/22 1340   ondansetron (ZOFRAN) tablet 4 mg  4 mg Oral Q6H PRN Bobette Mortiz, David Manuel, MD   4 mg at 05/18/22 1928   Or   ondansetron (ZOFRAN) injection 4 mg  4 mg  Intravenous Q6H PRN Bobette Mortiz, David Manuel, MD       [START ON 05/20/2022] thiamine (B-1) 250 mg  in sodium chloride 0.9 % 50 mL IVPB  250 mg Intravenous Q12H Bobette Mo, MD       thiamine 500mg  in normal saline (68ml) IVPB  500 mg Intravenous Q12H 45m, MD 100 mL/hr at 05/19/22 1044 500 mg at 05/19/22 1044               Psychiatric Specialty Exam: Pt asleep. Will return tomorrow for psychiatric exam.   Physical Exam: Physical Exam Pt asleep. Will return tomorrow for psychiatric exam.  ROSPt asleep. Will return tomorrow for psychiatric exam.   Blood pressure (!) 145/85, pulse (!) 101, temperature 98.8 F (37.1 C), resp. rate 20, SpO2 95 %. There is no height or weight on file to calculate BMI.  Treatment Plan Summary:  A: Alcohol use disorder, sever. Currently alcohol w/d.  MDD by history   Disposition:  I will return tomorrow 5-21 when pt is more alter to assess the pt and he can participate in the psychiatric interview.  -Continue IVC -Continue 1:1 sitter -Continue CIWA with ativan PRN and other medical treatment as per primary team.   11-27-1979, MD 05/19/2022 1:48 PM  Total Time Spent in Direct Patient Care:  I personally spent 10 minutes on the unit in direct patient care. The direct patient care time included face-to-face time with the patient, reviewing the patient's chart, communicating with other professionals, and coordinating care. Greater than 50% of this time was spent in counseling or coordinating care with the patient regarding goals of hospitalization, psycho-education, and discharge planning needs.   05/21/2022, MD Psychiatrist

## 2022-05-19 NOTE — Progress Notes (Signed)
PROGRESS NOTE    Keith Miller  WUJ:[id number] DOB: 1971/03/19 DOA: 05/18/2022 PCP: Creola Corn, MD   Brief Narrative:  51 y.o. male with medical history significant of EtOH abuse, depression, anxiety, suicidal ideations, history of substance-induced mood disorder presented with nausea, abdominal pain, tremors in the setting of alcohol withdrawal.  In the emergency room, he was placed under IVC by ED provider because of his mental status with possibility of Wernicke encephalopathy.  He was started on IV fluids and CIWA protocol.  Psychiatry has been consulted.  CT of the head was negative for acute intracranial abnormality.  Assessment & Plan:   Alcohol withdrawal Alcohol abuse Concern for Wernicke's encephalopathy -Continue CIWA protocol.  Currently on high-dose IV thiamine with concerns for Wernicke's encephalopathy.  Add folic acid and multivitamins. -Consult TOC -Has intermittently required restraints  History of severe depression -Patient has currently been IVC and on presentation by ED provider.  Psychiatry consultation is pending.  Elevated LFTs -possibly from above.  Monitor  Thrombocytopenia -From above.  No signs of bleeding.  Monitor   DVT prophylaxis: SCDs Code Status: Full Family Communication: None at bedside Disposition Plan: Status is: Inpatient Remains inpatient appropriate because: Because of severity of illness    Consultants: Psychiatry  Procedures: None  Antimicrobials: None   Subjective: Patient seen and examined at bedside.  Sitter present at bedside.  Patient is a poor historian, does not participate in conversation much.  Denies worsening shortness of breath, abdominal pain or vomiting.  Objective: Vitals:   05/19/22 0800 05/19/22 0806 05/19/22 1029 05/19/22 1030  BP: (!) 142/88 (!) 142/88 (!) 145/85 (!) 145/85  Pulse: (!) 106 (!) 105 (!) 104 (!) 101  Resp:  16  20  Temp:  98.8 F (37.1 C)    TempSrc:      SpO2:  98%  95%     Intake/Output Summary (Last 24 hours) at 05/19/2022 1145 Last data filed at 05/19/2022 0900 Gross per 24 hour  Intake 100 ml  Output --  Net 100 ml   There were no vitals filed for this visit.  Examination:  General exam: Appears calm and comfortable.  Looks chronically ill and deconditioned.  Respiratory system: Bilateral decreased breath sounds at bases with some scattered crackles Cardiovascular system: S1 & S2 heard, tachycardic Gastrointestinal system: Abdomen is nondistended, soft and nontender. Normal bowel sounds heard. Extremities: No cyanosis, clubbing, edema  Central nervous system: Awake, slow to respond, poor historian.  No focal neurological deficits. Moving extremities Skin: No rashes, lesions or ulcers Psychiatry: Flat affect.  Currently not agitated.     Data Reviewed: I have personally reviewed following labs and imaging studies  CBC: Recent Labs  Lab 05/17/22 1447 05/18/22 1149 05/19/22 0537  WBC 7.0 8.1 6.7  NEUTROABS  --  7.0  --   HGB 15.1 13.5 13.2  HCT 44.7 38.5* 37.8*  MCV 92.5 92.3 94.3  PLT 146* 118* 82*   Basic Metabolic Panel: Recent Labs  Lab 05/17/22 1447 05/18/22 1149 05/19/22 0537  NA 144 143 142  K 4.1 3.5 3.6  CL 109 109 107  CO2 21* 24 25  GLUCOSE 110* 110* 107*  BUN 7 8 12   CREATININE 1.09 0.96 1.00  CALCIUM 8.4* 7.9* 8.3*  MG  --  2.0  --   PHOS  --  3.7 3.7   GFR: CrCl cannot be calculated (Unknown ideal weight.). Liver Function Tests: Recent Labs  Lab 05/17/22 1447 05/18/22 1149 05/19/22 0537  AST 112*  131* 106*  ALT 64* 57* 52*  ALKPHOS 32* 31* 33*  BILITOT 1.0 0.9 1.1  PROT 7.1 6.7 6.5  ALBUMIN 4.5 4.2 4.0   Recent Labs  Lab 05/18/22 1149  LIPASE 76*   Recent Labs  Lab 05/17/22 1501  AMMONIA 21   Coagulation Profile: No results for input(s): INR, PROTIME in the last 168 hours. Cardiac Enzymes: No results for input(s): CKTOTAL, CKMB, CKMBINDEX, TROPONINI in the last 168 hours. BNP (last 3  results) No results for input(s): PROBNP in the last 8760 hours. HbA1C: No results for input(s): HGBA1C in the last 72 hours. CBG: No results for input(s): GLUCAP in the last 168 hours. Lipid Profile: No results for input(s): CHOL, HDL, LDLCALC, TRIG, CHOLHDL, LDLDIRECT in the last 72 hours. Thyroid Function Tests: No results for input(s): TSH, T4TOTAL, FREET4, T3FREE, THYROIDAB in the last 72 hours. Anemia Panel: No results for input(s): VITAMINB12, FOLATE, FERRITIN, TIBC, IRON, RETICCTPCT in the last 72 hours. Sepsis Labs: No results for input(s): PROCALCITON, LATICACIDVEN in the last 168 hours.  No results found for this or any previous visit (from the past 240 hour(s)).       Radiology Studies: CT Head Wo Contrast  Result Date: 05/18/2022 CLINICAL DATA:  Altered mental status EXAM: CT HEAD WITHOUT CONTRAST TECHNIQUE: Contiguous axial images were obtained from the base of the skull through the vertex without intravenous contrast. RADIATION DOSE REDUCTION: This exam was performed according to the departmental dose-optimization program which includes automated exposure control, adjustment of the mA and/or kV according to patient size and/or use of iterative reconstruction technique. COMPARISON:  05/04/2022 FINDINGS: Brain: No evidence of acute infarction, hemorrhage, hydrocephalus, extra-axial collection or mass lesion/mass effect. Vascular: No hyperdense vessel or unexpected calcification. Skull: Normal. Negative for fracture or focal lesion. Sinuses/Orbits: No acute finding. Other: None. IMPRESSION: No acute intracranial abnormality noted. Electronically Signed   By: Alcide Clever M.D.   On: 05/18/2022 18:02        Scheduled Meds:  LORazepam  0-4 mg Intravenous Q4H   Followed by   Melene Muller ON 05/20/2022] LORazepam  0-4 mg Intravenous Q8H   Continuous Infusions:  dextrose 5 % and 0.9 % NaCl with KCl 20 mEq/L 125 mL/hr at 05/19/22 1043   [START ON 05/20/2022] thiamine injection      thiamine injection 500 mg (05/19/22 1044)          Glade Lloyd, MD [facility] 05/19/2022, 11:45 AM

## 2022-05-20 ENCOUNTER — Inpatient Hospital Stay (HOSPITAL_COMMUNITY): Payer: PRIVATE HEALTH INSURANCE

## 2022-05-20 DIAGNOSIS — F10939 Alcohol use, unspecified with withdrawal, unspecified: Secondary | ICD-10-CM | POA: Diagnosis not present

## 2022-05-20 DIAGNOSIS — F332 Major depressive disorder, recurrent severe without psychotic features: Secondary | ICD-10-CM | POA: Diagnosis not present

## 2022-05-20 LAB — D-DIMER, QUANTITATIVE: D-Dimer, Quant: 3.65 ug/mL-FEU — ABNORMAL HIGH (ref 0.00–0.50)

## 2022-05-20 LAB — COMPREHENSIVE METABOLIC PANEL
ALT: 40 U/L (ref 0–44)
AST: 63 U/L — ABNORMAL HIGH (ref 15–41)
Albumin: 3.4 g/dL — ABNORMAL LOW (ref 3.5–5.0)
Alkaline Phosphatase: 29 U/L — ABNORMAL LOW (ref 38–126)
Anion gap: 7 (ref 5–15)
BUN: 12 mg/dL (ref 6–20)
CO2: 23 mmol/L (ref 22–32)
Calcium: 8.1 mg/dL — ABNORMAL LOW (ref 8.9–10.3)
Chloride: 109 mmol/L (ref 98–111)
Creatinine, Ser: 0.91 mg/dL (ref 0.61–1.24)
GFR, Estimated: >60 mL/min (ref >60)
Glucose, Bld: 182 mg/dL — ABNORMAL HIGH (ref 70–99)
Potassium: 3.4 mmol/L — ABNORMAL LOW (ref 3.5–5.1)
Sodium: 139 mmol/L (ref 135–145)
Total Bilirubin: 1.2 mg/dL (ref 0.3–1.2)
Total Protein: 6 g/dL — ABNORMAL LOW (ref 6.5–8.1)

## 2022-05-20 LAB — CBC WITH DIFFERENTIAL/PLATELET
Abs Immature Granulocytes: 0.01 10*3/uL (ref 0.00–0.07)
Basophils Absolute: 0 10*3/uL (ref 0.0–0.1)
Basophils Relative: 0 %
Eosinophils Absolute: 0.1 10*3/uL (ref 0.0–0.5)
Eosinophils Relative: 2 %
HCT: 35.8 % — ABNORMAL LOW (ref 39.0–52.0)
Hemoglobin: 12.3 g/dL — ABNORMAL LOW (ref 13.0–17.0)
Immature Granulocytes: 0 %
Lymphocytes Relative: 10 %
Lymphs Abs: 0.4 10*3/uL — ABNORMAL LOW (ref 0.7–4.0)
MCH: 32.5 pg (ref 26.0–34.0)
MCHC: 34.4 g/dL (ref 30.0–36.0)
MCV: 94.7 fL (ref 80.0–100.0)
Monocytes Absolute: 0.3 10*3/uL (ref 0.1–1.0)
Monocytes Relative: 6 %
Neutro Abs: 3.5 10*3/uL (ref 1.7–7.7)
Neutrophils Relative %: 82 %
Platelets: 78 10*3/uL — ABNORMAL LOW (ref 150–400)
RBC: 3.78 MIL/uL — ABNORMAL LOW (ref 4.22–5.81)
RDW: 13.2 % (ref 11.5–15.5)
WBC: 4.3 10*3/uL (ref 4.0–10.5)
nRBC: 0 % (ref 0.0–0.2)

## 2022-05-20 LAB — MAGNESIUM: Magnesium: 2 mg/dL (ref 1.7–2.4)

## 2022-05-20 LAB — TROPONIN I (HIGH SENSITIVITY)
Troponin I (High Sensitivity): 7 ng/L (ref <18)
Troponin I (High Sensitivity): 7 ng/L (ref <18)

## 2022-05-20 LAB — GLUCOSE, CAPILLARY: Glucose-Capillary: 101 mg/dL — ABNORMAL HIGH (ref 70–99)

## 2022-05-20 MED ORDER — ORAL CARE MOUTH RINSE
15.0000 mL | Freq: Two times a day (BID) | OROMUCOSAL | Status: DC
  Administered 2022-05-20: 15 mL via OROMUCOSAL

## 2022-05-20 MED ORDER — IOHEXOL 350 MG/ML SOLN
80.0000 mL | Freq: Once | INTRAVENOUS | Status: AC | PRN
  Administered 2022-05-20: 75 mL via INTRAVENOUS

## 2022-05-20 MED ORDER — LIP MEDEX EX OINT
TOPICAL_OINTMENT | CUTANEOUS | Status: DC | PRN
  Filled 2022-05-20: qty 7

## 2022-05-20 MED ORDER — PANTOPRAZOLE SODIUM 40 MG PO TBEC
40.0000 mg | DELAYED_RELEASE_TABLET | Freq: Every day | ORAL | Status: DC
  Administered 2022-05-20 – 2022-05-22 (×3): 40 mg via ORAL
  Filled 2022-05-20 (×3): qty 1

## 2022-05-20 MED ORDER — THIAMINE HCL 100 MG/ML IJ SOLN
250.0000 mg | Freq: Two times a day (BID) | INTRAVENOUS | Status: DC
  Administered 2022-05-20 – 2022-05-22 (×4): 250 mg via INTRAVENOUS
  Filled 2022-05-20 (×5): qty 2.5

## 2022-05-20 MED ORDER — NITROGLYCERIN 0.4 MG SL SUBL: 0.4000 mg | SUBLINGUAL_TABLET | SUBLINGUAL | Status: DC | PRN

## 2022-05-20 MED ORDER — ALUM & MAG HYDROXIDE-SIMETH 200-200-20 MG/5ML PO SUSP: 30.0000 mL | Freq: Four times a day (QID) | ORAL | Status: DC | PRN

## 2022-05-20 MED ORDER — POTASSIUM CHLORIDE CRYS ER 20 MEQ PO TBCR
40.0000 meq | EXTENDED_RELEASE_TABLET | Freq: Once | ORAL | Status: AC
  Administered 2022-05-20: 40 meq via ORAL
  Filled 2022-05-20: qty 2

## 2022-05-20 NOTE — Consult Note (Signed)
[facility] Face-to-Face Psychiatry Consult   Reason for Consult:  depression, alcohol dependence  Referring Physician:  Abundio Miu, MD Patient Identification: Keith Miller MRN:  [id number] Principal Diagnosis: Alcohol withdrawal ([place]) Diagnosis:  Principal Problem:   Alcohol withdrawal ([place]) Active Problems:   MDD (major depressive disorder), recurrent episode, severe ([place])   Total Time spent with patient: 30 minutes  Subjective:   Keith Miller is a 51 y.o. male with medical history significant of EtOH abuse, depression, anxiety, suicidal ideations history of substance-induced mood disorder, overweight who presented to the emergency department for involuntary commitment by family, but subsequently discharged AMA as he was fully oriented, admitted to being depressed but denies suicidal ideations.  He came back this morning BIBEMS with complaints of nausea, abdominal pain, and tremors in the setting of alcohol withdrawal.  Then, the patient apparently urinated on the chair near the sitter who prompted the sitter to leave his room momentarily, while the patient promptly left through the ED ambulance bay.  Dr.Kommor filed IVC for the patient due to his current mental state with possible Warnicke cephalopathy and he was found by the police, who brought him back to the emergency department.  He was found with 2 almost empty bottles of wine by the GDP.    HPI:   PTA psych meds: none (not taking)  On assessment today, patient is falling in and out of sleep.  He has difficulty concentrating throughout the interview.  He is A&O x3.  He does answer questions in a logical and rational manner when awake for 1 to 2-minute intervals. Patient reports excessive alcohol use for many weeks, drinking to blackout.  On assessment today he denies any suicidal thoughts at this time, within the last 2 weeks. Patient reports that his mood has been down depressed and sad.  Reports anhedonia and pervasive sadness.  Patient  reports that sleep has been poor leading up to hospitalization.  Patient reports poor appetite and demonstrates poor concentration.  Denies HI. Reports anxiety is up and down.  Denies any psychotic symptoms.  Denies any symptoms of mania or hypomania.  Patient gave this writer a consent to talk to his girlfriend, Warren Lacy, [phone].  [name] states the patient has been drinking excessively "to pass out" since Apr 30, 2022.  She reports it appears sobriety for 3 weeks prior to this.  She reports he is unable to take care of himself, becomes intoxicated and "shits all over the house".  She reports that on Thursday, the patient sent her a text message that he "wanted to kill himself". (Of note, the patient did have a thoughts or thoughts that life was not worth living or that he would be better off dead, leading up to this hospitalization within the past few weeks.)  Past Psychiatric History:  Diagnosis: Major depressive disorder, alcohol use disorder Patient has recent multiple psychiatric hospitalizations Pt has h/o suicidal thoughts with intent and plan  Risk to Self:  yes Risk to Others:  yes (DUI when intoxicated) Prior Inpatient Therapy:   Prior Outpatient Therapy:    Past Medical History:  Past Medical History:  Diagnosis Date   ETOH abuse     Past Surgical History:  Procedure Laterality Date   ANKLE SURGERY     KNEE SURGERY     Family History: No family history on file. Family Psychiatric  History: Mother diagnosed with major depressive disorder.  Denies family history of suicide attempt. Social History:  Social History   Substance and  Sexual Activity  Alcohol Use Yes   Comment: 3-4 half pints of vodka QD     Social History   Substance and Sexual Activity  Drug Use Not Currently    Social History   Socioeconomic History   Marital status: Significant Other    Spouse name: Not on file   Number of children: Not on file   Years of education: Not on file   Highest education  level: Not on file  Occupational History   Not on file  Tobacco Use   Smoking status: Never   Smokeless tobacco: Never  Vaping Use   Vaping Use: Never used  Substance and Sexual Activity   Alcohol use: Yes    Comment: 3-4 half pints of vodka QD   Drug use: Not Currently   Sexual activity: Yes    Birth control/protection: None  Other Topics Concern   Not on file  Social History Narrative   Not on file   Social Determinants of Health   Financial Resource Strain: Not on file  Food Insecurity: Not on file  Transportation Needs: Not on file  Physical Activity: Not on file  Stress: Not on file  Social Connections: Not on file   Additional Social History:    Allergies:  No Known Allergies  Labs:  Results for orders placed or performed during the hospital encounter of 05/18/22 (from the past 48 hour(s))  Phosphorus     Status: None   Collection Time: 05/19/22  5:37 AM  Result Value Ref Range   Phosphorus 3.7 2.5 - 4.6 mg/dL    Comment: Performed at [facility], [address] [place], [place] [address]  CBC     Status: Abnormal   Collection Time: 05/19/22  5:37 AM  Result Value Ref Range   WBC 6.7 4.0 - 10.5 K/uL   RBC 4.01 (L) 4.22 - 5.81 MIL/uL   Hemoglobin 13.2 13.0 - 17.0 g/dL   HCT 34.1 (L) 93.7 - 90.2 %   MCV 94.3 80.0 - 100.0 fL   MCH 32.9 26.0 - 34.0 pg   MCHC 34.9 30.0 - 36.0 g/dL   RDW 40.9 73.5 - 32.9 %   Platelets 82 (L) 150 - 400 K/uL    Comment: Immature Platelet Fraction may be clinically indicated, consider ordering this additional test [id number]    nRBC 0.0 0.0 - 0.2 %    Comment: Performed at [facility], [address] [place], [place] [address]  Comprehensive metabolic panel     Status: Abnormal   Collection Time: 05/19/22  5:37 AM  Result Value Ref Range   Sodium 142 135 - 145 mmol/L   Potassium 3.6 3.5 - 5.1 mmol/L   Chloride 107 98 - 111 mmol/L   CO2 25 22 - 32 mmol/L   Glucose, Bld 107 (H) 70 -  99 mg/dL    Comment: Glucose reference range applies only to samples taken after fasting for at least 8 hours.   BUN 12 6 - 20 mg/dL   Creatinine, Ser 2.97 0.61 - 1.24 mg/dL   Calcium 8.3 (L) 8.9 - 10.3 mg/dL   Total Protein 6.5 6.5 - 8.1 g/dL   Albumin 4.0 3.5 - 5.0 g/dL   AST 989 (H) 15 - 41 U/L   ALT 52 (H) 0 - 44 U/L   Alkaline Phosphatase 33 (L) 38 - 126 U/L   Total Bilirubin 1.1 0.3 - 1.2 mg/dL   GFR, Estimated >21 >19 mL/min  Comment: (NOTE) Calculated using the CKD-EPI Creatinine Equation (2021)    Anion gap 10 5 - 15    Comment: Performed at [facility], [place] [address], [place], [place] [address]  Glucose, capillary     Status: Abnormal   Collection Time: 05/20/22 12:42 AM  Result Value Ref Range   Glucose-Capillary 101 (H) 70 - 99 mg/dL    Comment: Glucose reference range applies only to samples taken after fasting for at least 8 hours.  CBC with Differential/Platelet     Status: Abnormal   Collection Time: 05/20/22  2:33 AM  Result Value Ref Range   WBC 4.3 4.0 - 10.5 K/uL   RBC 3.78 (L) 4.22 - 5.81 MIL/uL   Hemoglobin 12.3 (L) 13.0 - 17.0 g/dL   HCT 35.8 (L) 39.0 - 52.0 %   MCV 94.7 80.0 - 100.0 fL   MCH 32.5 26.0 - 34.0 pg   MCHC 34.4 30.0 - 36.0 g/dL   RDW 13.2 11.5 - 15.5 %   Platelets 78 (L) 150 - 400 K/uL    Comment: Immature Platelet Fraction may be clinically indicated, consider ordering this additional test [id number]    nRBC 0.0 0.0 - 0.2 %   Neutrophils Relative % 82 %   Neutro Abs 3.5 1.7 - 7.7 K/uL   Lymphocytes Relative 10 %   Lymphs Abs 0.4 (L) 0.7 - 4.0 K/uL   Monocytes Relative 6 %   Monocytes Absolute 0.3 0.1 - 1.0 K/uL   Eosinophils Relative 2 %   Eosinophils Absolute 0.1 0.0 - 0.5 K/uL   Basophils Relative 0 %   Basophils Absolute 0.0 0.0 - 0.1 K/uL   Immature Granulocytes 0 %   Abs Immature Granulocytes 0.01 0.00 - 0.07 K/uL    Comment: Performed at [facility], [place] [address]  [place], [place] [address]  Comprehensive metabolic panel     Status: Abnormal   Collection Time: 05/20/22  2:33 AM  Result Value Ref Range   Sodium 139 135 - 145 mmol/L   Potassium 3.4 (L) 3.5 - 5.1 mmol/L   Chloride 109 98 - 111 mmol/L   CO2 23 22 - 32 mmol/L   Glucose, Bld 182 (H) 70 - 99 mg/dL    Comment: Glucose reference range applies only to samples taken after fasting for at least 8 hours.   BUN 12 6 - 20 mg/dL   Creatinine, Ser 0.91 0.61 - 1.24 mg/dL   Calcium 8.1 (L) 8.9 - 10.3 mg/dL   Total Protein 6.0 (L) 6.5 - 8.1 g/dL   Albumin 3.4 (L) 3.5 - 5.0 g/dL   AST 63 (H) 15 - 41 U/L   ALT 40 0 - 44 U/L   Alkaline Phosphatase 29 (L) 38 - 126 U/L   Total Bilirubin 1.2 0.3 - 1.2 mg/dL   GFR, Estimated >60 >60 mL/min    Comment: (NOTE) Calculated using the CKD-EPI Creatinine Equation (2021)    Anion gap 7 5 - 15    Comment: Performed at [facility], [place] [address] [place], [place] [address]  Magnesium     Status: None   Collection Time: 05/20/22  2:33 AM  Result Value Ref Range   Magnesium 2.0 1.7 - 2.4 mg/dL    Comment: Performed at [facility], [place] [address], [place], [place] [address]  Troponin I (High Sensitivity)     Status: None   Collection Time: 05/20/22  2:33 AM  Result Value Ref  Range   Troponin I (High Sensitivity) 7 <18 ng/L    Comment: (NOTE) Elevated high sensitivity troponin I (hsTnI) values and significant  changes across serial measurements may suggest ACS but many other  chronic and acute conditions are known to elevate hsTnI results.  Refer to the "Links" section for chest pain algorithms and additional  guidance. Performed at [facility], [place] [address] [place], [place] [address]   D-dimer, quantitative     Status: Abnormal   Collection Time: 05/20/22  2:33 AM  Result Value Ref Range   D-Dimer, Quant 3.65 (H) 0.00 - 0.50 ug/mL-FEU    Comment: (NOTE) At the manufacturer cut-off value of 0.5  g/mL FEU, this assay has a negative predictive value of 95-100%.This assay is intended for use in conjunction with a clinical pretest probability (PTP) assessment model to exclude pulmonary embolism (PE) and deep venous thrombosis (DVT) in outpatients suspected of PE or DVT. Results should be correlated with clinical presentation. Performed at [facility], [place] [address] [place], [place] [address]   Troponin I (High Sensitivity)     Status: None   Collection Time: 05/20/22  3:58 AM  Result Value Ref Range   Troponin I (High Sensitivity) 7 <18 ng/L    Comment: (NOTE) Elevated high sensitivity troponin I (hsTnI) values and significant  changes across serial measurements may suggest ACS but many other  chronic and acute conditions are known to elevate hsTnI results.  Refer to the "Links" section for chest pain algorithms and additional  guidance. Performed at [facility], [place] [address], [place], [place] [address]     Current Facility-Administered Medications  Medication Dose Route Frequency Provider Last Rate Last Admin   alum & mag hydroxide-simeth (MAALOX/MYLANTA) 200-200-20 MG/5ML suspension 30 mL  30 mL Oral Q6H PRN Aline August, MD       folic acid (FOLVITE) tablet 1 mg  1 mg Oral Daily Alekh, Kshitiz, MD   1 mg at 05/20/22 0840   haloperidol lactate (HALDOL) injection 5 mg  5 mg Intravenous Q6H PRN Reubin Milan, MD       LORazepam (ATIVAN) injection 0-4 mg  0-4 mg Intravenous Q4H Reubin Milan, MD   3 mg at 05/20/22 [id number]   Followed by   LORazepam (ATIVAN) injection 0-4 mg  0-4 mg Intravenous Q8H Reubin Milan, MD       LORazepam (ATIVAN) tablet 1-4 mg  1-4 mg Oral Q1H PRN Reubin Milan, MD   2 mg at 05/19/22 1745   Or   LORazepam (ATIVAN) injection 1-4 mg  1-4 mg Intravenous Q1H PRN Reubin Milan, MD   2 mg at 05/20/22 0630   multivitamin with minerals tablet 1 tablet  1 tablet Oral Daily Aline August,  MD   1 tablet at 05/20/22 0850   nitroGLYCERIN (NITROSTAT) SL tablet 0.4 mg  0.4 mg Sublingual Q5 min PRN Aline August, MD       ondansetron (ZOFRAN) tablet 4 mg  4 mg Oral Q6H PRN Reubin Milan, MD   4 mg at 05/18/22 1928   Or   ondansetron (ZOFRAN) injection 4 mg  4 mg Intravenous Q6H PRN Reubin Milan, MD       pantoprazole (PROTONIX) EC tablet 40 mg  40 mg Oral Daily Alekh, Kshitiz, MD   40 mg at 05/20/22 0840   thiamine (B-1) 250 mg in sodium chloride 0.9 % 50 mL IVPB  250 mg Intravenous Q12H  Aline August, MD       thiamine 500mg  in normal saline (88ml) IVPB  500 mg Intravenous Q12H Reubin Milan, MD 100 mL/hr at 05/19/22 2132 500 mg at 05/19/22 2132               Psychiatric Specialty Exam:  Presentation  General Appearance: Disheveled  Eye Contact:Poor  Speech:Slow  Speech Volume:Decreased  Handedness:Right   Mood and Affect  Mood:Anxious; Depressed  Affect:Depressed   Thought Process  Thought Processes:Coherent  Descriptions of Associations:Intact  Orientation:Full (Time, Place and Person)  Thought Content:Logical  History of Schizophrenia/Schizoaffective disorder:No  Duration of Psychotic Symptoms:No data recorded Hallucinations:Hallucinations: None  Ideas of Reference:None  Suicidal Thoughts:Suicidal Thoughts: No (patient denies but girlf friend states that pt had texted her that he wanted to kill himself.)  Homicidal Thoughts:Homicidal Thoughts: No   Sensorium  Memory:Immediate Good; Recent Good  Judgment:Poor  Insight:Poor   Executive Functions  Concentration:Poor  Attention Span:Poor  [place]  Language:Good   Psychomotor Activity  Psychomotor Activity:Psychomotor Activity: Tremor   Assets  Assets:Communication Skills; Resilience; Housing; Social Support; Desire for Improvement   Sleep  Sleep:No data recorded  Physical Exam: Physical Exam Vitals reviewed.   Constitutional:      Appearance: He is ill-appearing and diaphoretic.  Tremulous   Review of Systems  Psychiatric/Behavioral:  Positive for depression and substance abuse. The patient is nervous/anxious.   Blood pressure (!) 153/90, pulse 91, temperature 99.3 F (37.4 C), temperature source Oral, resp. rate 20, SpO2 98 %. There is no height or weight on file to calculate BMI.  Treatment Plan Summary: Daily contact with patient to assess and evaluate symptoms and progress in treatment  Assessment: Alcohol use disorder, severe. Currently w/d from alcohol  MDD, severe recurrent w/o psychotic features  GAD  Plan:  -Recommend inpatient psychiatric hospitalization, was medically cleared.  Patient historically, and consistently continues at this time to minimize his psychiatric symptoms and provide untruthful answers during the risk assessment.  Patient today denied having any suicidal thoughts, however when his girlfriend was contacted, she stated that he had sent text messages to her that he wanted to kill himself. -Continue IVC and consider -Continue medical management and management of alcohol withdrawal by the medical team. -It is in the patient's best interest to be inpatient residential substance use treatment for a long period of time, when he is discharged eventually from the psychiatric unit, given his history of multiple psychiatric hospitalizations and ED visits for alcohol intoxication.  Patient is high risk to himself due to not only his suicidal thoughts, but also the directed medical consequences of his alcohol use disorder.  Disposition: Recommend psychiatric Inpatient admission when medically cleared.  Christoper Allegra, MD 05/20/2022 10:39 AM  Total Time Spent in Direct Patient Care:  I personally spent 60 minutes on the unit in direct patient care. The direct patient care time included face-to-face time with the patient, reviewing the patient's chart, communicating with  other professionals, and coordinating care. Greater than 50% of this time was spent in counseling or coordinating care with the patient regarding goals of hospitalization, psycho-education, and discharge planning needs.   Janine Limbo, MD Psychiatrist

## 2022-05-20 NOTE — Progress Notes (Signed)
PROGRESS NOTE    PAULA ZIETZ  ZOX:[id number] DOB: [birth year] DOA: 05/18/2022 PCP: Creola Corn, MD   Brief Narrative:  51 y.o. male with medical history significant of EtOH abuse, depression, anxiety, suicidal ideations, history of substance-induced mood disorder presented with nausea, abdominal pain, tremors in the setting of alcohol withdrawal.  In the emergency room, he was placed under IVC by ED provider because of his mental status with possibility of Wernicke encephalopathy.  He was started on IV fluids and CIWA protocol.  Psychiatry has been consulted.  CT of the head was negative for acute intracranial abnormality.  Assessment & Plan:   Alcohol withdrawal Alcohol abuse Concern for Wernicke's encephalopathy -Continue CIWA protocol.  Currently on high-dose IV thiamine with concerns for Wernicke's encephalopathy.  Continue folic acid and multivitamins. -Consult TOC -Has intermittently required restraints  History of severe depression -Patient was IVC'd on presentation by ED provider.  Psychiatry tried to evaluate the patient on 05/19/2022 but patient was not alert enough.  Will follow psychiatry recommendations.  Chest pain -Patient has been having chest pain overnight.  Troponins negative.  EKG nonischemic.  D-dimer elevated.  CT angio pending. -Use nitroglycerin as needed.  Add Protonix and Maalox   elevated LFTs -possibly from above.  Improving.  Monitor  Hypokalemia -Replace.  Repeat a.m. labs  Thrombocytopenia -From above.  No signs of bleeding.  Monitor   DVT prophylaxis: SCDs Code Status: Full Family Communication: None at bedside Disposition Plan: Status is: Inpatient Remains inpatient appropriate because: Because of severity of illness    Consultants: Psychiatry  Procedures: None  Antimicrobials: None   Subjective: Patient seen and examined at bedside.  Sitter present at bedside.  Poor historian.  Has had chest pain on and off overnight.  No fever,  vomiting, seizures or agitation reported. Objective: Vitals:   05/19/22 2120 05/20/22 0041 05/20/22 0135 05/20/22 0404  BP: (!) 151/88 (!) 150/91 (!) 155/89 (!) 153/90  Pulse: 97 97 98 91  Resp: 20 20 (!) 24 20  Temp: 99 F (37.2 C) 99.6 F (37.6 C) 100 F (37.8 C) 99.3 F (37.4 C)  TempSrc: Oral Oral Oral Oral  SpO2: 97% 96% 96% 98%    Intake/Output Summary (Last 24 hours) at 05/20/2022 0753 Last data filed at 05/20/2022 0600 Gross per 24 hour  Intake 2248.14 ml  Output 400 ml  Net 1848.14 ml    There were no vitals filed for this visit.  Examination:  General: On room air.  No distress.  Chronically ill and deconditioned looking. ENT/neck: No thyromegaly.  JVD is not elevated  respiratory: Decreased breath sounds at bases bilaterally with some crackles; no wheezing.  Intermittently tachypneic CVS: S1-S2 heard, rate controlled currently Abdominal: Soft, nontender, slightly distended; no organomegaly, normal bowel sounds are heard Extremities: Trace lower extremity edema; no cyanosis  CNS: Alert; still extremely slow to respond.  Poor historian.  No focal neurologic deficit.  Moves extremities. Looks tremulous Lymph: No obvious lymphadenopathy Skin: No obvious ecchymosis/lesions  psych: No signs of agitation currently.  Affect is flat  musculoskeletal: No obvious joint swelling/deformity     Data Reviewed: I have personally reviewed following labs and imaging studies  CBC: Recent Labs  Lab 05/17/22 1447 05/18/22 1149 05/19/22 0537 05/20/22 0233  WBC 7.0 8.1 6.7 4.3  NEUTROABS  --  7.0  --  3.5  HGB 15.1 13.5 13.2 12.3*  HCT 44.7 38.5* 37.8* 35.8*  MCV 92.5 92.3 94.3 94.7  PLT 146* 118* 82* 78*  Basic Metabolic Panel: Recent Labs  Lab 05/17/22 1447 05/18/22 1149 05/19/22 0537 05/20/22 0233  NA 144 143 142 139  K 4.1 3.5 3.6 3.4*  CL 109 109 107 109  CO2 21* 24 25 23   GLUCOSE 110* 110* 107* 182*  BUN 7 8 12 12   CREATININE 1.09 0.96 1.00 0.91   CALCIUM 8.4* 7.9* 8.3* 8.1*  MG  --  2.0  --  2.0  PHOS  --  3.7 3.7  --     GFR: CrCl cannot be calculated (Unknown ideal weight.). Liver Function Tests: Recent Labs  Lab 05/17/22 1447 05/18/22 1149 05/19/22 0537 05/20/22 0233  AST 112* 131* 106* 63*  ALT 64* 57* 52* 40  ALKPHOS 32* 31* 33* 29*  BILITOT 1.0 0.9 1.1 1.2  PROT 7.1 6.7 6.5 6.0*  ALBUMIN 4.5 4.2 4.0 3.4*    Recent Labs  Lab 05/18/22 1149  LIPASE 76*    Recent Labs  Lab 05/17/22 1501  AMMONIA 21    Coagulation Profile: No results for input(s): INR, PROTIME in the last 168 hours. Cardiac Enzymes: No results for input(s): CKTOTAL, CKMB, CKMBINDEX, TROPONINI in the last 168 hours. BNP (last 3 results) No results for input(s): PROBNP in the last 8760 hours. HbA1C: No results for input(s): HGBA1C in the last 72 hours. CBG: Recent Labs  Lab 05/20/22 0042  GLUCAP 101*   Lipid Profile: No results for input(s): CHOL, HDL, LDLCALC, TRIG, CHOLHDL, LDLDIRECT in the last 72 hours. Thyroid Function Tests: No results for input(s): TSH, T4TOTAL, FREET4, T3FREE, THYROIDAB in the last 72 hours. Anemia Panel: No results for input(s): VITAMINB12, FOLATE, FERRITIN, TIBC, IRON, RETICCTPCT in the last 72 hours. Sepsis Labs: No results for input(s): PROCALCITON, LATICACIDVEN in the last 168 hours.  No results found for this or any previous visit (from the past 240 hour(s)).       Radiology Studies: CT Head Wo Contrast  Result Date: 05/18/2022 CLINICAL DATA:  Altered mental status EXAM: CT HEAD WITHOUT CONTRAST TECHNIQUE: Contiguous axial images were obtained from the base of the skull through the vertex without intravenous contrast. RADIATION DOSE REDUCTION: This exam was performed according to the departmental dose-optimization program which includes automated exposure control, adjustment of the mA and/or kV according to patient size and/or use of iterative reconstruction technique. COMPARISON:  05/04/2022  FINDINGS: Brain: No evidence of acute infarction, hemorrhage, hydrocephalus, extra-axial collection or mass lesion/mass effect. Vascular: No hyperdense vessel or unexpected calcification. Skull: Normal. Negative for fracture or focal lesion. Sinuses/Orbits: No acute finding. Other: None. IMPRESSION: No acute intracranial abnormality noted. Electronically Signed   By: Alcide CleverMark  Lukens M.D.   On: 05/18/2022 18:02   DG CHEST PORT 1 VIEW  Result Date: 05/20/2022 CLINICAL DATA:  Shortness of breath. EXAM: PORTABLE CHEST 1 VIEW COMPARISON:  Chest radiograph dated 12/05/2021. FINDINGS: No focal consolidation, pleural effusion, pneumothorax. The cardiac silhouette is within limits. There is persistent widening of the upper mediastinum similar to prior radiograph. This may be projectional. However, underlying aortic aneurysm is not excluded. CT may provide better evaluation. No acute osseous pathology. IMPRESSION: 1. No acute cardiopulmonary process. 2. Persistent widening of the upper mediastinum. Electronically Signed   By: Elgie CollardArash  Radparvar M.D.   On: 05/20/2022 02:21        Scheduled Meds:  folic acid  1 mg Oral Daily   LORazepam  0-4 mg Intravenous Q4H   Followed by   LORazepam  0-4 mg Intravenous Q8H   multivitamin with minerals  1  tablet Oral Daily   Continuous Infusions:  dextrose 5 % and 0.9 % NaCl with KCl 20 mEq/L 75 mL/hr at 05/20/22 0359   thiamine injection     thiamine injection 500 mg (05/19/22 2132)          Glade Lloyd, MD [facility] 05/20/2022, 7:53 AM

## 2022-05-20 NOTE — Progress Notes (Signed)
Patient complained of chest pain. Vital signs were within normal range , EKG done was sinus rhythm and CBG read 101. Provider notified.

## 2022-05-20 NOTE — Progress Notes (Signed)
IVC paperwork signed and in patient's chart.

## 2022-05-20 NOTE — Progress Notes (Signed)
Patient A&Ox3 this shift with periods of confusion. CIWA 19 this AM but currently is a 3. Patient has been sleeping off and on majority of the shift, last Ativan dose given at 0850. Patient spoke with his gf, [name], on the phone x2 this shift. Pt refusing all meals, will drink occasionally and did drink an entire Boost this shift. VSS. Will continue to monitor closely.

## 2022-05-20 NOTE — Progress Notes (Signed)
Patient continued with complaints of chest pain, vital signs were checked and EKG repeated. Provider was notified and D dimer and  Troponin were ordered.

## 2022-05-21 ENCOUNTER — Encounter (HOSPITAL_COMMUNITY): Payer: Self-pay | Admitting: Internal Medicine

## 2022-05-21 ENCOUNTER — Other Ambulatory Visit: Payer: Self-pay

## 2022-05-21 DIAGNOSIS — F10939 Alcohol use, unspecified with withdrawal, unspecified: Secondary | ICD-10-CM | POA: Diagnosis not present

## 2022-05-21 DIAGNOSIS — R7989 Other specified abnormal findings of blood chemistry: Secondary | ICD-10-CM

## 2022-05-21 DIAGNOSIS — R079 Chest pain, unspecified: Secondary | ICD-10-CM

## 2022-05-21 DIAGNOSIS — F332 Major depressive disorder, recurrent severe without psychotic features: Secondary | ICD-10-CM | POA: Diagnosis not present

## 2022-05-21 DIAGNOSIS — D696 Thrombocytopenia, unspecified: Secondary | ICD-10-CM

## 2022-05-21 DIAGNOSIS — K209 Esophagitis, unspecified without bleeding: Secondary | ICD-10-CM

## 2022-05-21 LAB — COMPREHENSIVE METABOLIC PANEL
ALT: 32 U/L (ref 0–44)
AST: 35 U/L (ref 15–41)
Albumin: 3.5 g/dL (ref 3.5–5.0)
Alkaline Phosphatase: 30 U/L — ABNORMAL LOW (ref 38–126)
Anion gap: 8 (ref 5–15)
BUN: 11 mg/dL (ref 6–20)
CO2: 21 mmol/L — ABNORMAL LOW (ref 22–32)
Calcium: 8.4 mg/dL — ABNORMAL LOW (ref 8.9–10.3)
Chloride: 108 mmol/L (ref 98–111)
Creatinine, Ser: 0.88 mg/dL (ref 0.61–1.24)
GFR, Estimated: >60 mL/min (ref >60)
Glucose, Bld: 132 mg/dL — ABNORMAL HIGH (ref 70–99)
Potassium: 3.5 mmol/L (ref 3.5–5.1)
Sodium: 137 mmol/L (ref 135–145)
Total Bilirubin: 1.1 mg/dL (ref 0.3–1.2)
Total Protein: 6.2 g/dL — ABNORMAL LOW (ref 6.5–8.1)

## 2022-05-21 LAB — MAGNESIUM: Magnesium: 2 mg/dL (ref 1.7–2.4)

## 2022-05-21 MED ORDER — LORAZEPAM 1 MG PO TABS: 1.0000 mg | ORAL_TABLET | ORAL | Status: DC | PRN

## 2022-05-21 MED ORDER — LORAZEPAM 2 MG/ML IJ SOLN
1.0000 mg | INTRAMUSCULAR | Status: DC | PRN
  Administered 2022-05-21 – 2022-05-22 (×2): 3 mg via INTRAVENOUS
  Administered 2022-05-22: 2 mg via INTRAVENOUS
  Administered 2022-05-22: 3 mg via INTRAVENOUS
  Filled 2022-05-21: qty 2
  Filled 2022-05-21: qty 1
  Filled 2022-05-21: qty 2

## 2022-05-21 NOTE — Progress Notes (Signed)
PROGRESS NOTE    Keith Miller  RUE:[id number] DOB: 11/20/1971 DOA: 05/18/2022 PCP: Creola Cornusso, John, MD   Brief Narrative:  51 y.o. male with medical history significant of EtOH abuse, depression, anxiety, suicidal ideations, history of substance-induced mood disorder presented with nausea, abdominal pain, tremors in the setting of alcohol withdrawal.  In the emergency room, he was placed under IVC by ED provider because of his mental status with possibility of Wernicke encephalopathy.  He was started on IV fluids and CIWA protocol.  Psychiatry has been consulted.  CT of the head was negative for acute intracranial abnormality.  Assessment & Plan:   Alcohol withdrawal Alcohol abuse Concern for Wernicke's encephalopathy -Continue CIWA protocol.  Currently on high-dose IV thiamine with concerns for Wernicke's encephalopathy.  Continue folic acid and multivitamins. -Consult TOC  History of severe depression -Patient was IVC'd on presentation by ED provider.   -Psychiatry recommends inpatient psychiatric hospitalization once medically stable.  Chest pain -Patient has been having chest pain overnight.  Troponins negative.  EKG nonischemic.  D-dimer elevated.  CT angio chest was negative for PE.-Use nitroglycerin as needed.  Possible esophagitis -As seen on CTA chest.  Continue Protonix.  Use as needed Maalox.  Outpatient follow-up with GI if needed.  elevated LFTs -possibly from above.  LFTs have much improved.    Hypokalemia -Improved.  Thrombocytopenia -From above.  No signs of bleeding.  Monitor intermittently.    DVT prophylaxis: SCDs Code Status: Full Family Communication: None at bedside Disposition Plan: Status is: Inpatient Remains inpatient appropriate because: Because of severity of illness.  Need for inpatient psychiatric hospitalization    Consultants: Psychiatry  Procedures: None  Antimicrobials: None   Subjective: Patient seen and examined at bedside.   Sitter present at bedside.  No seizures, vomiting, fever reported.  Nursing staff reports that patient refused all meals yesterday.  Wants to go home.  Objective: Vitals:   05/20/22 1452 05/20/22 2002 05/21/22 0245 05/21/22 0254  BP: (!) 152/94 (!) 148/94  (!) 144/83  Pulse: 86 89 97 92  Resp: 16 (!) 22  18  Temp: 98.5 F (36.9 C) 97.8 F (36.6 C)  98.4 F (36.9 C)  TempSrc: Oral Axillary  Oral  SpO2: (!) 84% 97%  97%  Weight:      Height:        Intake/Output Summary (Last 24 hours) at 05/21/2022 0749 Last data filed at 05/21/2022 0430 Gross per 24 hour  Intake 1010 ml  Output 1600 ml  Net -590 ml    Filed Weights   05/20/22 1202  Weight: 88.7 kg    Examination:  General: Currently still on room air.  No acute distress currently.  Chronically ill and deconditioned looking. ENT/neck: No palpable obvious masses.  No JVD elevation  respiratory: Intermittent tachypnea.  Bilateral decreased breath sounds at bases, no wheezing CVS: Rate is currently controlled; S1 and S2 heard  abdominal: Soft, nontender, still mildly distended; no organomegaly, bowel sounds are heard normally  extremities: No clubbing; mild lower extremity edema present  CNS: Very slow to respond.  Awake.  Poor historian.  No focal neurologic deficit.  Moving extremities  lymph: No palpable cervical lymphadenopathy  skin: No obvious petechiae/rashes  psych: Currently not agitated.  Intermittently gets upset.  Flat affect  musculoskeletal: No obvious joint swelling/deformity     Data Reviewed: I have personally reviewed following labs and imaging studies  CBC: Recent Labs  Lab 05/17/22 1447 05/18/22 1149 05/19/22 0537 05/20/22 0233  WBC  7.0 8.1 6.7 4.3  NEUTROABS  --  7.0  --  3.5  HGB 15.1 13.5 13.2 12.3*  HCT 44.7 38.5* 37.8* 35.8*  MCV 92.5 92.3 94.3 94.7  PLT 146* 118* 82* 78*    Basic Metabolic Panel: Recent Labs  Lab 05/17/22 1447 05/18/22 1149 05/19/22 0537 05/20/22 0233  05/21/22 0408  NA 144 143 142 139 137  K 4.1 3.5 3.6 3.4* 3.5  CL 109 109 107 109 108  CO2 21* 24 25 23  21*  GLUCOSE 110* 110* 107* 182* 132*  BUN 7 8 12 12 11   CREATININE 1.09 0.96 1.00 0.91 0.88  CALCIUM 8.4* 7.9* 8.3* 8.1* 8.4*  MG  --  2.0  --  2.0 2.0  PHOS  --  3.7 3.7  --   --     GFR: Estimated Creatinine Clearance: 109 mL/min (by C-G formula based on SCr of 0.88 mg/dL). Liver Function Tests: Recent Labs  Lab 05/17/22 1447 05/18/22 1149 05/19/22 0537 05/20/22 0233 05/21/22 0408  AST 112* 131* 106* 63* 35  ALT 64* 57* 52* 40 32  ALKPHOS 32* 31* 33* 29* 30*  BILITOT 1.0 0.9 1.1 1.2 1.1  PROT 7.1 6.7 6.5 6.0* 6.2*  ALBUMIN 4.5 4.2 4.0 3.4* 3.5    Recent Labs  Lab 05/18/22 1149  LIPASE 76*    Recent Labs  Lab 05/17/22 1501  AMMONIA 21    Coagulation Profile: No results for input(s): INR, PROTIME in the last 168 hours. Cardiac Enzymes: No results for input(s): CKTOTAL, CKMB, CKMBINDEX, TROPONINI in the last 168 hours. BNP (last 3 results) No results for input(s): PROBNP in the last 8760 hours. HbA1C: No results for input(s): HGBA1C in the last 72 hours. CBG: Recent Labs  Lab 05/20/22 0042  GLUCAP 101*    Lipid Profile: No results for input(s): CHOL, HDL, LDLCALC, TRIG, CHOLHDL, LDLDIRECT in the last 72 hours. Thyroid Function Tests: No results for input(s): TSH, T4TOTAL, FREET4, T3FREE, THYROIDAB in the last 72 hours. Anemia Panel: No results for input(s): VITAMINB12, FOLATE, FERRITIN, TIBC, IRON, RETICCTPCT in the last 72 hours. Sepsis Labs: No results for input(s): PROCALCITON, LATICACIDVEN in the last 168 hours.  No results found for this or any previous visit (from the past 240 hour(s)).       Radiology Studies: CT Angio Chest Pulmonary Embolism (PE) W or WO Contrast  Result Date: 05/20/2022 CLINICAL DATA:  High probability for pulmonary embolus. Shortness of breath. EXAM: CT ANGIOGRAPHY CHEST WITH CONTRAST TECHNIQUE: Multidetector  CT imaging of the chest was performed using the standard protocol during bolus administration of intravenous contrast. Multiplanar CT image reconstructions and MIPs were obtained to evaluate the vascular anatomy. RADIATION DOSE REDUCTION: This exam was performed according to the departmental dose-optimization program which includes automated exposure control, adjustment of the mA and/or kV according to patient size and/or use of iterative reconstruction technique. CONTRAST:  60mL OMNIPAQUE IOHEXOL 350 MG/ML SOLN COMPARISON:  Chest x-ray May 20, 2022. FINDINGS: Cardiovascular: Motion limits evaluation for pulmonary emboli, particularly peripherally with bilateral stairstep artifact, particularly in the bases. Within these limitations, no emboli are identified. The heart and thoracic aorta are unremarkable. Mediastinum/Nodes: There is a tiny left pleural effusion. There is mild increased attenuation in the fat adjacent to the esophagus. Mild distal esophageal thickening not excluded. The remainder of the esophagus is normal. No abnormal air in the mediastinum. The thyroid is normal. No adenopathy. The chest wall is normal. Lungs/Pleura: Central airways are normal. No pneumothorax. Evaluation limited  due to motion. Within these limitations, there is no evidence of pneumonia or aspiration. No nodules or masses identified. Upper Abdomen: No acute abnormality. Musculoskeletal: No chest wall abnormality. No acute or significant osseous findings. Review of the MIP images confirms the above findings. IMPRESSION: 1. The study is limited due to respiratory motion. 2. No central pulmonary emboli identified. Evaluation for emboli more peripherally is markedly limited due to respiratory motion. No emboli noted. 3. There is apparent mild increased attenuation in the fat adjacent to the distal esophagus raising the possibility of inflammation such as esophagitis. Esophageal thickening is not excluded. Recommend clinical  correlation. Evaluation of this region is somewhat limited due to motion. If the clinical picture is ambiguous, endoscopy could better evaluate the esophagus. 4. Tiny left pleural effusion, nonspecific. 5. No other abnormalities. Electronically Signed   By: Gerome Sam III M.D.   On: 05/20/2022 12:02   DG CHEST PORT 1 VIEW  Result Date: 05/20/2022 CLINICAL DATA:  Shortness of breath. EXAM: PORTABLE CHEST 1 VIEW COMPARISON:  Chest radiograph dated 12/05/2021. FINDINGS: No focal consolidation, pleural effusion, pneumothorax. The cardiac silhouette is within limits. There is persistent widening of the upper mediastinum similar to prior radiograph. This may be projectional. However, underlying aortic aneurysm is not excluded. CT may provide better evaluation. No acute osseous pathology. IMPRESSION: 1. No acute cardiopulmonary process. 2. Persistent widening of the upper mediastinum. Electronically Signed   By: Elgie Collard M.D.   On: 05/20/2022 02:21        Scheduled Meds:  folic acid  1 mg Oral Daily   LORazepam  0-4 mg Intravenous Q8H   mouth rinse  15 mL Mouth Rinse BID   multivitamin with minerals  1 tablet Oral Daily   pantoprazole  40 mg Oral Daily   Continuous Infusions:  thiamine injection Stopped (05/20/22 2150)          Glade Lloyd, MD [facility] 05/21/2022, 7:49 AM

## 2022-05-21 NOTE — Progress Notes (Signed)
Patient's sister, [name] (patient's POA), spoke on phone with RN and voiced concerns about patient's living situation. Wanted to make sure everyone is aware that patient recently sold his house to avoid foreclosure, and has since moved in with his 52 year old father. Per sister, pt is a hazard to his father's safety. Patient's sister and girlfriend both also asking repeatedly for a Neuro Consult. Believes many of patient's symptoms originated after patient's first concussion, and pt has since had several other head injuries.

## 2022-05-21 NOTE — TOC Initial Note (Signed)
Transition of Care [facility]) - Initial/Assessment Note    Patient Details  Name: Keith Miller MRN: [id number] Date of Birth: 02/23/1971  [facility] CM/SW Contact:    Lanier Clam, RN Phone Number: 05/21/2022, 9:55 AM  Clinical Narrative:  Psych-IP psych-IVC-await medical stablility.[facility]-referral to review-await response for acceptance. Per attending likely stable in am.                 Expected Discharge Plan: Psychiatric Hospital Barriers to Discharge: Continued Medical Work up   Patient Goals and CMS Choice        Expected Discharge Plan and Services Expected Discharge Plan: Psychiatric Hospital                                              Prior Living Arrangements/Services                       Activities of Daily Living      Permission Sought/Granted                  Emotional Assessment              Admission diagnosis:  Alcohol withdrawal (HCC) [F10.939] Patient Active Problem List   Diagnosis Date Noted   Alcohol intoxication (HCC) 05/18/2022   MDD (major depressive disorder), recurrent episode (HCC) 03/05/2022   Alcohol use disorder, severe, dependence (HCC) 02/14/2022   MDD (major depressive disorder), recurrent episode, severe (HCC) 02/13/2022   Substance induced mood disorder (HCC)    Suicidal ideation    Alcohol withdrawal syndrome with complication, with unspecified complication (HCC) 01/02/2022   Alcohol withdrawal (HCC) 01/02/2022   Anxiety disorder 05/22/2016   Pain in right elbow 05/22/2016   Rosacea 05/22/2016   PCP:  Creola Corn, MD Pharmacy:   [id number] [place], [place] - [address] AT [facility] [address] [facility] [address] [place] [address] Phone: [phone] Fax: [phone]     Social Determinants of Health (SDOH) Interventions    Readmission Risk Interventions     View : No data to display.

## 2022-05-22 ENCOUNTER — Encounter (HOSPITAL_COMMUNITY): Payer: Self-pay | Admitting: Psychiatry

## 2022-05-22 ENCOUNTER — Inpatient Hospital Stay (HOSPITAL_COMMUNITY)
Admission: AD | Admit: 2022-05-22 | Discharge: 2022-05-25 | DRG: 885 | Disposition: A | Discharge status: Other Acute Inpatient Hospital | Payer: Federal, State, Local not specified - Other | Source: Intra-hospital | Attending: Psychiatry | Admitting: Psychiatry

## 2022-05-22 DIAGNOSIS — F10229 Alcohol dependence with intoxication, unspecified: Secondary | ICD-10-CM | POA: Diagnosis present

## 2022-05-22 DIAGNOSIS — F419 Anxiety disorder, unspecified: Secondary | ICD-10-CM | POA: Diagnosis present

## 2022-05-22 DIAGNOSIS — F1024 Alcohol dependence with alcohol-induced mood disorder: Secondary | ICD-10-CM | POA: Diagnosis present

## 2022-05-22 DIAGNOSIS — R45851 Suicidal ideations: Secondary | ICD-10-CM | POA: Diagnosis present

## 2022-05-22 DIAGNOSIS — F10231 Alcohol dependence with withdrawal delirium: Secondary | ICD-10-CM | POA: Diagnosis present

## 2022-05-22 DIAGNOSIS — F1020 Alcohol dependence, uncomplicated: Secondary | ICD-10-CM | POA: Diagnosis present

## 2022-05-22 DIAGNOSIS — F10939 Alcohol use, unspecified with withdrawal, unspecified: Secondary | ICD-10-CM | POA: Diagnosis not present

## 2022-05-22 DIAGNOSIS — Y900 Blood alcohol level of less than 20 mg/100 ml: Secondary | ICD-10-CM | POA: Diagnosis present

## 2022-05-22 DIAGNOSIS — F10239 Alcohol dependence with withdrawal, unspecified: Secondary | ICD-10-CM | POA: Diagnosis present

## 2022-05-22 DIAGNOSIS — F332 Major depressive disorder, recurrent severe without psychotic features: Secondary | ICD-10-CM | POA: Diagnosis present

## 2022-05-22 DIAGNOSIS — F1994 Other psychoactive substance use, unspecified with psychoactive substance-induced mood disorder: Secondary | ICD-10-CM | POA: Diagnosis present

## 2022-05-22 DIAGNOSIS — F1094 Alcohol use, unspecified with alcohol-induced mood disorder: Secondary | ICD-10-CM | POA: Diagnosis present

## 2022-05-22 LAB — CBC WITH DIFFERENTIAL/PLATELET
Abs Immature Granulocytes: 0.02 10*3/uL (ref 0.00–0.07)
Basophils Absolute: 0 10*3/uL (ref 0.0–0.1)
Basophils Relative: 1 %
Eosinophils Absolute: 0.1 10*3/uL (ref 0.0–0.5)
Eosinophils Relative: 3 %
HCT: 40.9 % (ref 39.0–52.0)
Hemoglobin: 13.9 g/dL (ref 13.0–17.0)
Immature Granulocytes: 1 %
Lymphocytes Relative: 22 %
Lymphs Abs: 0.9 10*3/uL (ref 0.7–4.0)
MCH: 32 pg (ref 26.0–34.0)
MCHC: 34 g/dL (ref 30.0–36.0)
MCV: 94.2 fL (ref 80.0–100.0)
Monocytes Absolute: 0.5 10*3/uL (ref 0.1–1.0)
Monocytes Relative: 12 %
Neutro Abs: 2.4 10*3/uL (ref 1.7–7.7)
Neutrophils Relative %: 61 %
Platelets: 151 10*3/uL (ref 150–400)
RBC: 4.34 MIL/uL (ref 4.22–5.81)
RDW: 13.4 % (ref 11.5–15.5)
WBC: 3.9 10*3/uL — ABNORMAL LOW (ref 4.0–10.5)
nRBC: 0 % (ref 0.0–0.2)

## 2022-05-22 LAB — COMPREHENSIVE METABOLIC PANEL
ALT: 26 U/L (ref 0–44)
AST: 26 U/L (ref 15–41)
Albumin: 3.7 g/dL (ref 3.5–5.0)
Alkaline Phosphatase: 30 U/L — ABNORMAL LOW (ref 38–126)
Anion gap: 8 (ref 5–15)
BUN: 15 mg/dL (ref 6–20)
CO2: 21 mmol/L — ABNORMAL LOW (ref 22–32)
Calcium: 8.7 mg/dL — ABNORMAL LOW (ref 8.9–10.3)
Chloride: 108 mmol/L (ref 98–111)
Creatinine, Ser: 0.87 mg/dL (ref 0.61–1.24)
GFR, Estimated: >60 mL/min (ref >60)
Glucose, Bld: 111 mg/dL — ABNORMAL HIGH (ref 70–99)
Potassium: 3.6 mmol/L (ref 3.5–5.1)
Sodium: 137 mmol/L (ref 135–145)
Total Bilirubin: 1 mg/dL (ref 0.3–1.2)
Total Protein: 6.6 g/dL (ref 6.5–8.1)

## 2022-05-22 LAB — RESP PANEL BY RT-PCR (FLU A&B, COVID) ARPGX2
Influenza A by PCR: NEGATIVE
Influenza B by PCR: NEGATIVE
SARS Coronavirus 2 by RT PCR: NEGATIVE

## 2022-05-22 LAB — MAGNESIUM: Magnesium: 2 mg/dL (ref 1.7–2.4)

## 2022-05-22 MED ORDER — LORAZEPAM 1 MG PO TABS: 1.0000 mg | ORAL_TABLET | Freq: Two times a day (BID) | ORAL | Status: DC

## 2022-05-22 MED ORDER — ONDANSETRON HCL 4 MG PO TABS: 4.0000 mg | ORAL_TABLET | Freq: Four times a day (QID) | ORAL | Status: DC | PRN

## 2022-05-22 MED ORDER — FOLIC ACID 1 MG PO TABS
1.0000 mg | ORAL_TABLET | Freq: Every day | ORAL | Status: DC
  Administered 2022-05-23 – 2022-05-25 (×3): 1 mg via ORAL
  Filled 2022-05-22 (×5): qty 1

## 2022-05-22 MED ORDER — ACETAMINOPHEN 325 MG PO TABS: 650.0000 mg | ORAL_TABLET | Freq: Four times a day (QID) | ORAL | Status: DC | PRN

## 2022-05-22 MED ORDER — LOPERAMIDE HCL 2 MG PO CAPS: 2.0000 mg | ORAL_CAPSULE | ORAL | Status: DC | PRN

## 2022-05-22 MED ORDER — ADULT MULTIVITAMIN W/MINERALS CH
1.0000 | ORAL_TABLET | Freq: Every day | ORAL | Status: DC
  Administered 2022-05-23 – 2022-05-25 (×3): 1 via ORAL
  Filled 2022-05-22 (×6): qty 1

## 2022-05-22 MED ORDER — HYDROXYZINE HCL 25 MG PO TABS
25.0000 mg | ORAL_TABLET | Freq: Three times a day (TID) | ORAL | Status: DC | PRN
Start: 2022-05-22 — End: 2022-05-23

## 2022-05-22 MED ORDER — PANTOPRAZOLE SODIUM 40 MG PO TBEC
40.0000 mg | DELAYED_RELEASE_TABLET | Freq: Every day | ORAL | Status: DC
Start: 2022-05-23 — End: 2022-05-25
  Administered 2022-05-23 – 2022-05-25 (×3): 40 mg via ORAL
  Filled 2022-05-22 (×4): qty 1

## 2022-05-22 MED ORDER — LORAZEPAM 1 MG PO TABS: 1.0000 mg | ORAL_TABLET | ORAL | Status: DC | PRN

## 2022-05-22 MED ORDER — MAGNESIUM HYDROXIDE 400 MG/5ML PO SUSP: 30.0000 mL | Freq: Every day | ORAL | Status: DC | PRN

## 2022-05-22 MED ORDER — LORAZEPAM 1 MG PO TABS
1.0000 mg | ORAL_TABLET | Freq: Three times a day (TID) | ORAL | Status: DC
Start: 2022-05-24 — End: 2022-05-24
  Administered 2022-05-24 (×2): 1 mg via ORAL
  Filled 2022-05-22 (×2): qty 1

## 2022-05-22 MED ORDER — THIAMINE HCL 100 MG/ML IJ SOLN: 100.0000 mg | Freq: Once | INTRAMUSCULAR | Status: DC

## 2022-05-22 MED ORDER — ONDANSETRON 4 MG PO TBDP: 4.0000 mg | ORAL_TABLET | Freq: Four times a day (QID) | ORAL | Status: DC | PRN

## 2022-05-22 MED ORDER — THIAMINE HCL 100 MG PO TABS: 100.0000 mg | ORAL_TABLET | Freq: Every day | ORAL | Status: DC

## 2022-05-22 MED ORDER — ENSURE ENLIVE PO LIQD
237.0000 mL | Freq: Two times a day (BID) | ORAL | Status: DC
  Administered 2022-05-23 – 2022-05-25 (×5): 237 mL via ORAL
  Filled 2022-05-22 (×7): qty 237

## 2022-05-22 MED ORDER — ONDANSETRON HCL 4 MG/2ML IJ SOLN
4.0000 mg | Freq: Four times a day (QID) | INTRAMUSCULAR | Status: DC | PRN
Start: 2022-05-22 — End: 2022-05-23

## 2022-05-22 MED ORDER — ADULT MULTIVITAMIN W/MINERALS CH
1.0000 | ORAL_TABLET | Freq: Every day | ORAL | Status: DC
  Filled 2022-05-22: qty 1

## 2022-05-22 MED ORDER — HALOPERIDOL LACTATE 5 MG/ML IJ SOLN: 5.0000 mg | Freq: Four times a day (QID) | INTRAMUSCULAR | Status: DC | PRN

## 2022-05-22 MED ORDER — HYDROXYZINE HCL 25 MG PO TABS
25.0000 mg | ORAL_TABLET | Freq: Four times a day (QID) | ORAL | Status: DC | PRN
  Administered 2022-05-22 – 2022-05-25 (×4): 25 mg via ORAL
  Filled 2022-05-22: qty 10
  Filled 2022-05-22 (×4): qty 1

## 2022-05-22 MED ORDER — LORAZEPAM 2 MG/ML IJ SOLN: 1.0000 mg | INTRAMUSCULAR | Status: DC | PRN

## 2022-05-22 MED ORDER — LIP MEDEX EX OINT: TOPICAL_OINTMENT | CUTANEOUS | Status: DC | PRN

## 2022-05-22 MED ORDER — THIAMINE HCL 100 MG PO TABS
100.0000 mg | ORAL_TABLET | Freq: Every day | ORAL | Status: DC
  Administered 2022-05-23 – 2022-05-25 (×3): 100 mg via ORAL
  Filled 2022-05-22 (×4): qty 1

## 2022-05-22 MED ORDER — LORAZEPAM 1 MG PO TABS: 1.0000 mg | ORAL_TABLET | Freq: Four times a day (QID) | ORAL | Status: DC | PRN

## 2022-05-22 MED ORDER — ALUM & MAG HYDROXIDE-SIMETH 200-200-20 MG/5ML PO SUSP: 30.0000 mL | Freq: Four times a day (QID) | ORAL | Status: DC | PRN

## 2022-05-22 MED ORDER — LORAZEPAM 1 MG PO TABS
1.0000 mg | ORAL_TABLET | Freq: Four times a day (QID) | ORAL | Status: AC
  Administered 2022-05-22 – 2022-05-23 (×6): 1 mg via ORAL
  Filled 2022-05-22 (×6): qty 1

## 2022-05-22 MED ORDER — PANTOPRAZOLE SODIUM 40 MG PO TBEC: 40.0000 mg | DELAYED_RELEASE_TABLET | Freq: Every day | ORAL | 1 refills | Status: DC

## 2022-05-22 MED ORDER — LORAZEPAM 1 MG PO TABS: 1.0000 mg | ORAL_TABLET | Freq: Every day | ORAL | Status: DC

## 2022-05-22 NOTE — Discharge Summary (Signed)
Physician Discharge Summary  Keith Miller KVQ:[id number] DOB: Jan 03, 1971 DOA: 05/18/2022  PCP: Creola Corn, MD  Admit date: 05/18/2022 Discharge date: 05/22/2022  Admitted From: home Disposition:  [facility]  Recommendations for Outpatient Follow-up:  Follow up with PCP in 1-2 weeks  Home Health: none Equipment/Devices: none  Discharge Condition: stable CODE STATUS: Full code Diet Orders (From admission, onward)     Start     Ordered   05/18/22 1736  Diet Heart Room service appropriate? Yes; Fluid consistency: Thin  Diet effective now       Question Answer Comment  Room service appropriate? Yes   Fluid consistency: Thin      05/18/22 1737            HPI: Per admitting MD, Keith Miller is a 51 y.o. male with medical history significant of EtOH abuse, depression, anxiety, suicidal ideations history of substance-induced mood disorder, overweight who presented to the emergency department yesterday for involuntary commitment by family, but subsequently discharged AMA as he was fully oriented, admitted to being depressed but denies suicidal ideations.  He came back this morning BIBEMS with complaints of nausea, abdominal pain, and tremors in the setting of alcohol withdrawal.  Then, the patient apparently urinated on the chair near the sitter who prompted the sitter to leave his room momentarily, while the patient promptly left through the ED ambulance bay.  Dr.Kommor filed IVC for the patient due to his current mental state with possible Warnicke cephalopathy and he was found by the police, who brought him back to the emergency department.  He was found with 2 almost empty bottles of wine by the GDP.  He is oriented x2, but disoriented to time and date, except for the year, which she was able to recall after a while with some difficulty.  Somnolent, disoriented to month, day of the month, day of the week, time of the day and current situation.  He denied having headache, chest, back or  abdominal pain when asked.  Hospital Course / Discharge diagnoses: Principal Problem:   Alcohol withdrawal (HCC) Active Problems:   MDD (major depressive disorder), recurrent episode, severe (HCC)   Alcohol use disorder, severe, dependence (HCC)   Chest pain   Esophagitis   Elevated LFTs   Thrombocytopenia (HCC)   Alcohol withdrawal Alcohol abuse Concern for Wernicke's encephalopathy -patient was admitted to the hospital for EtOH withdrawal.  He was managed conservatively, withdrawals much improved now, he appears calmer, able to ambulate in the hallway without difficulties.  Has been placed on thiamine due to concern for Warnicke's encephalopathy, but I do not see a thiamine level ordered on admission.  Now he has been on supplementation for a few days.  Continue thiamine  Active problems History of severe depression -Patient was IVC'd on presentation by ED provider. Psychiatry recommends inpatient psychiatric hospitalization once medically stable.  There is concern for suicidal ideation Chest pain -Patient has been having chest pain shortly after admission.  Troponins negative.  EKG nonischemic.  D-dimer elevated.  CT angio chest was negative for PE.  Resolved Possible esophagitis -As seen on CTA chest.  Continue Protonix.   Elevated LFTs -from ETOH.  LFTs have much improved.   Hypokalemia -Improved. Thrombocytopenia -From ETOH.  No signs of bleeding.  Monitor intermittently.    Sepsis ruled out   Discharge Instructions   Allergies as of 05/22/2022   No Known Allergies      Medication List     STOP taking these  medications    b complex-C-E-zinc tablet   chlordiazePOXIDE 25 MG capsule Commonly known as: LIBRIUM   gabapentin 100 MG capsule Commonly known as: NEURONTIN   naltrexone 50 MG tablet Commonly known as: DEPADE       TAKE these medications    escitalopram 10 MG tablet Commonly known as: LEXAPRO Take 1 tablet (10 mg total) by mouth daily.    hydrOXYzine 25 MG tablet Commonly known as: ATARAX Take 1 tablet (25 mg total) by mouth every 6 (six) hours as needed for anxiety.   pantoprazole 40 MG tablet Commonly known as: Protonix Take 1 tablet (40 mg total) by mouth daily.   thiamine 100 MG tablet Take 1 tablet (100 mg total) by mouth daily.   traZODone 50 MG tablet Commonly known as: DESYREL Take 1 tablet (50 mg total) by mouth at bedtime as needed for sleep.       Consultations: Psychiatry   Procedures/Studies:  CT Head Wo Contrast  Result Date: 05/18/2022 CLINICAL DATA:  Altered mental status EXAM: CT HEAD WITHOUT CONTRAST TECHNIQUE: Contiguous axial images were obtained from the base of the skull through the vertex without intravenous contrast. RADIATION DOSE REDUCTION: This exam was performed according to the departmental dose-optimization program which includes automated exposure control, adjustment of the mA and/or kV according to patient size and/or use of iterative reconstruction technique. COMPARISON:  05/04/2022 FINDINGS: Brain: No evidence of acute infarction, hemorrhage, hydrocephalus, extra-axial collection or mass lesion/mass effect. Vascular: No hyperdense vessel or unexpected calcification. Skull: Normal. Negative for fracture or focal lesion. Sinuses/Orbits: No acute finding. Other: None. IMPRESSION: No acute intracranial abnormality noted. Electronically Signed   By: Alcide Clever M.D.   On: 05/18/2022 18:02   CT Head Wo Contrast  Result Date: 05/04/2022 CLINICAL DATA:  Found down EXAM: CT HEAD WITHOUT CONTRAST CT CERVICAL SPINE WITHOUT CONTRAST TECHNIQUE: Multidetector CT imaging of the head and cervical spine was performed following the standard protocol without intravenous contrast. Multiplanar CT image reconstructions of the cervical spine were also generated. RADIATION DOSE REDUCTION: This exam was performed according to the departmental dose-optimization program which includes automated exposure control,  adjustment of the mA and/or kV according to patient size and/or use of iterative reconstruction technique. COMPARISON:  Head CT 01/21/2022, cervical spine CT 12/05/2021 FINDINGS: CT HEAD FINDINGS Brain: There is no acute intracranial hemorrhage, extra-axial fluid collection, or acute infarct Parenchymal volume is normal. The ventricles are normal in size. Gray-white differentiation is preserved. There is no mass lesion.  There is no mass effect or midline shift. Vascular: No hyperdense vessel or unexpected calcification. Skull: Normal. Negative for fracture or focal lesion. Sinuses/Orbits: There is mild mucosal thickening in the paranasal sinuses. The globes and orbits are unremarkable. Other: None. CT CERVICAL SPINE FINDINGS Alignment: Normal. There is no antero or retrolisthesis. There is no jumped or perched facets or other evidence of traumatic malalignment. Skull base and vertebrae: Skull base alignment is maintained. Vertebral body heights are preserved. There is no evidence of acute fracture. Soft tissues and spinal canal: No prevertebral fluid or swelling. No visible canal hematoma. Disc levels: Disc space narrowing degenerative endplate changes most advanced at C4-C5 and C6-C7. There is multilevel facet arthropathy. There is no high-grade spinal canal stenosis. There is bilateral neural foraminal stenosis most advanced at C4-C5 and C6-C7. Upper chest: The imaged lung apices are clear. Other: None. IMPRESSION: 1. No acute intracranial pathology. 2. No acute fracture or traumatic malalignment of the cervical spine. Electronically Signed   By:  Lesia Hausen M.D.   On: 05/04/2022 09:05   CT Angio Chest Pulmonary Embolism (PE) W or WO Contrast  Result Date: 05/20/2022 CLINICAL DATA:  High probability for pulmonary embolus. Shortness of breath. EXAM: CT ANGIOGRAPHY CHEST WITH CONTRAST TECHNIQUE: Multidetector CT imaging of the chest was performed using the standard protocol during bolus administration of  intravenous contrast. Multiplanar CT image reconstructions and MIPs were obtained to evaluate the vascular anatomy. RADIATION DOSE REDUCTION: This exam was performed according to the departmental dose-optimization program which includes automated exposure control, adjustment of the mA and/or kV according to patient size and/or use of iterative reconstruction technique. CONTRAST:  75mL OMNIPAQUE IOHEXOL 350 MG/ML SOLN COMPARISON:  Chest x-ray May 20, 2022. FINDINGS: Cardiovascular: Motion limits evaluation for pulmonary emboli, particularly peripherally with bilateral stairstep artifact, particularly in the bases. Within these limitations, no emboli are identified. The heart and thoracic aorta are unremarkable. Mediastinum/Nodes: There is a tiny left pleural effusion. There is mild increased attenuation in the fat adjacent to the esophagus. Mild distal esophageal thickening not excluded. The remainder of the esophagus is normal. No abnormal air in the mediastinum. The thyroid is normal. No adenopathy. The chest wall is normal. Lungs/Pleura: Central airways are normal. No pneumothorax. Evaluation limited due to motion. Within these limitations, there is no evidence of pneumonia or aspiration. No nodules or masses identified. Upper Abdomen: No acute abnormality. Musculoskeletal: No chest wall abnormality. No acute or significant osseous findings. Review of the MIP images confirms the above findings. IMPRESSION: 1. The study is limited due to respiratory motion. 2. No central pulmonary emboli identified. Evaluation for emboli more peripherally is markedly limited due to respiratory motion. No emboli noted. 3. There is apparent mild increased attenuation in the fat adjacent to the distal esophagus raising the possibility of inflammation such as esophagitis. Esophageal thickening is not excluded. Recommend clinical correlation. Evaluation of this region is somewhat limited due to motion. If the clinical picture is  ambiguous, endoscopy could better evaluate the esophagus. 4. Tiny left pleural effusion, nonspecific. 5. No other abnormalities. Electronically Signed   By: Gerome Sam III M.D.   On: 05/20/2022 12:02   CT Cervical Spine Wo Contrast  Result Date: 05/04/2022 CLINICAL DATA:  Found down EXAM: CT HEAD WITHOUT CONTRAST CT CERVICAL SPINE WITHOUT CONTRAST TECHNIQUE: Multidetector CT imaging of the head and cervical spine was performed following the standard protocol without intravenous contrast. Multiplanar CT image reconstructions of the cervical spine were also generated. RADIATION DOSE REDUCTION: This exam was performed according to the departmental dose-optimization program which includes automated exposure control, adjustment of the mA and/or kV according to patient size and/or use of iterative reconstruction technique. COMPARISON:  Head CT 01/21/2022, cervical spine CT 12/05/2021 FINDINGS: CT HEAD FINDINGS Brain: There is no acute intracranial hemorrhage, extra-axial fluid collection, or acute infarct Parenchymal volume is normal. The ventricles are normal in size. Gray-white differentiation is preserved. There is no mass lesion.  There is no mass effect or midline shift. Vascular: No hyperdense vessel or unexpected calcification. Skull: Normal. Negative for fracture or focal lesion. Sinuses/Orbits: There is mild mucosal thickening in the paranasal sinuses. The globes and orbits are unremarkable. Other: None. CT CERVICAL SPINE FINDINGS Alignment: Normal. There is no antero or retrolisthesis. There is no jumped or perched facets or other evidence of traumatic malalignment. Skull base and vertebrae: Skull base alignment is maintained. Vertebral body heights are preserved. There is no evidence of acute fracture. Soft tissues and spinal canal: No prevertebral fluid  or swelling. No visible canal hematoma. Disc levels: Disc space narrowing degenerative endplate changes most advanced at C4-C5 and C6-C7. There is  multilevel facet arthropathy. There is no high-grade spinal canal stenosis. There is bilateral neural foraminal stenosis most advanced at C4-C5 and C6-C7. Upper chest: The imaged lung apices are clear. Other: None. IMPRESSION: 1. No acute intracranial pathology. 2. No acute fracture or traumatic malalignment of the cervical spine. Electronically Signed   By: Lesia Hausen M.D.   On: 05/04/2022 09:05   DG CHEST PORT 1 VIEW  Result Date: 05/20/2022 CLINICAL DATA:  Shortness of breath. EXAM: PORTABLE CHEST 1 VIEW COMPARISON:  Chest radiograph dated 12/05/2021. FINDINGS: No focal consolidation, pleural effusion, pneumothorax. The cardiac silhouette is within limits. There is persistent widening of the upper mediastinum similar to prior radiograph. This may be projectional. However, underlying aortic aneurysm is not excluded. CT may provide better evaluation. No acute osseous pathology. IMPRESSION: 1. No acute cardiopulmonary process. 2. Persistent widening of the upper mediastinum. Electronically Signed   By: Elgie Collard M.D.   On: 05/20/2022 02:21     Subjective: - no chest pain, shortness of breath, no abdominal pain, nausea or vomiting.   Discharge Exam: BP 131/81 (BP Location: Right Arm)   Pulse (!) 104   Temp 98.2 F (36.8 C) (Oral)   Resp 20   Ht 6' (1.829 m)   Wt 88.7 kg   SpO2 99%   BMI 26.51 kg/m   General: Pt is alert, awake, not in acute distress Cardiovascular: RRR, S1/S2 +, no rubs, no gallops Respiratory: CTA bilaterally, no wheezing, no rhonchi Abdominal: Soft, NT, ND, bowel sounds + Extremities: no edema, no cyanosis   The results of significant diagnostics from this hospitalization (including imaging, microbiology, ancillary and laboratory) are listed below for reference.     Microbiology: Recent Results (from the past 240 hour(s))  Resp Panel by RT-PCR (Flu A&B, Covid) Nasopharyngeal Swab     Status: None   Collection Time: 05/22/22 12:29 PM   Specimen:  Nasopharyngeal Swab; Nasopharyngeal(NP) swabs in vial transport medium  Result Value Ref Range Status   SARS Coronavirus 2 by RT PCR NEGATIVE NEGATIVE Final    Comment: (NOTE) SARS-CoV-2 target nucleic acids are NOT DETECTED.  The SARS-CoV-2 RNA is generally detectable in upper respiratory specimens during the acute phase of infection. The lowest concentration of SARS-CoV-2 viral copies this assay can detect is 138 copies/mL. A negative result does not preclude SARS-Cov-2 infection and should not be used as the sole basis for treatment or other patient management decisions. A negative result may occur with  improper specimen collection/handling, submission of specimen other than nasopharyngeal swab, presence of viral mutation(s) within the areas targeted by this assay, and inadequate number of viral copies(<138 copies/mL). A negative result must be combined with clinical observations, patient history, and epidemiological information. The expected result is Negative.  Fact Sheet for Patients:  BloggerCourse.com  Fact Sheet for Healthcare Providers:  SeriousBroker.it  This test is no t yet approved or cleared by the [place] FDA and  has been authorized for detection and/or diagnosis of SARS-CoV-2 by FDA under an Emergency Use Authorization (EUA). This EUA will remain  in effect (meaning this test can be used) for the duration of the COVID-19 declaration under Section 564(b)(1) of the Act, 21 U.S.C.section 360bbb-3(b)(1), unless the authorization is terminated  or revoked sooner.       Influenza A by PCR NEGATIVE NEGATIVE Final   Influenza B by  PCR NEGATIVE NEGATIVE Final    Comment: (NOTE) The Xpert Xpress SARS-CoV-2/FLU/RSV plus assay is intended as an aid in the diagnosis of influenza from Nasopharyngeal swab specimens and should not be used as a sole basis for treatment. Nasal washings and aspirates are unacceptable for  Xpert Xpress SARS-CoV-2/FLU/RSV testing.  Fact Sheet for Patients: BloggerCourse.com  Fact Sheet for Healthcare Providers: SeriousBroker.it  This test is not yet approved or cleared by the [place] FDA and has been authorized for detection and/or diagnosis of SARS-CoV-2 by FDA under an Emergency Use Authorization (EUA). This EUA will remain in effect (meaning this test can be used) for the duration of the COVID-19 declaration under Section 564(b)(1) of the Act, 21 U.S.C. section 360bbb-3(b)(1), unless the authorization is terminated or revoked.  Performed at [facility], [address] [place], [place] [address]      Labs: Basic Metabolic Panel: Recent Labs  Lab 05/18/22 1149 05/19/22 0537 05/20/22 0233 05/21/22 0408 05/22/22 0422  NA 143 142 139 137 137  K 3.5 3.6 3.4* 3.5 3.6  CL 109 107 109 108 108  CO2 21* 21*  GLUCOSE 110* 107* 182* 132* 111*  BUN CREATININE 0.96 1.00 0.91 0.88 0.87  CALCIUM 7.9* 8.3* 8.1* 8.4* 8.7*  MG 2.0  --  2.0 2.0 2.0  PHOS 3.7 3.7  --   --   --    Liver Function Tests: Recent Labs  Lab 05/18/22 1149 05/19/22 0537 05/20/22 0233 05/21/22 0408 05/22/22 0422  AST 131* 106* 63* 35 26  ALT 57* 52* 40 32 26  ALKPHOS 31* 33* 29* 30* 30*  BILITOT 0.9 1.1 1.2 1.1 1.0  PROT 6.7 6.5 6.0* 6.2* 6.6  ALBUMIN 4.2 4.0 3.4* 3.5 3.7   CBC: Recent Labs  Lab 05/17/22 1447 05/18/22 1149 05/19/22 0537 05/20/22 0233 05/22/22 0422  WBC 7.0 8.1 6.7 4.3 3.9*  NEUTROABS  --  7.0  --  3.5 2.4  HGB 15.1 13.5 13.2 12.3* 13.9  HCT 44.7 38.5* 37.8* 35.8* 40.9  MCV 92.5 92.3 94.3 94.7 94.2  PLT 146* 118* 82* 78* 151   CBG: Recent Labs  Lab 05/20/22 0042  GLUCAP 101*   Hgb A1c No results for input(s): HGBA1C in the last 72 hours. Lipid Profile No results for input(s): CHOL, HDL, LDLCALC, TRIG, CHOLHDL, LDLDIRECT in the last 72 hours. Thyroid  function studies No results for input(s): TSH, T4TOTAL, T3FREE, THYROIDAB in the last 72 hours.  Invalid input(s): FREET3 Urinalysis    Component Value Date/Time   COLORURINE YELLOW 05/18/2022 1125   APPEARANCEUR CLEAR 05/18/2022 1125   LABSPEC 1.015 05/18/2022 1125   PHURINE 7.0 05/18/2022 1125   GLUCOSEU NEGATIVE 05/18/2022 1125   HGBUR NEGATIVE 05/18/2022 1125   BILIRUBINUR NEGATIVE 05/18/2022 1125   KETONESUR 5 (A) 05/18/2022 1125   PROTEINUR NEGATIVE 05/18/2022 1125   NITRITE NEGATIVE 05/18/2022 1125   LEUKOCYTESUR NEGATIVE 05/18/2022 1125    FURTHER DISCHARGE INSTRUCTIONS:   Get Medicines reviewed and adjusted: Please take all your medications with you for your next visit with your Primary MD   Laboratory/radiological data: Please request your Primary MD to go over all hospital tests and procedure/radiological results at the follow up, please ask your Primary MD to get all Hospital records sent to his/her office.   In some cases, they will be blood work, cultures and biopsy results pending at the time of your discharge. Please request that your primary  care M.D. goes through all the records of your hospital data and follows up on these results.   Also Note the following: If you experience worsening of your admission symptoms, develop shortness of breath, life threatening emergency, suicidal or homicidal thoughts you must seek medical attention immediately by calling 911 or calling your MD immediately  if symptoms less severe.   You must read complete instructions/literature along with all the possible adverse reactions/side effects for all the Medicines you take and that have been prescribed to you. Take any new Medicines after you have completely understood and accpet all the possible adverse reactions/side effects.    Do not drive when taking Pain medications or sleeping medications (Benzodaizepines)   Do not take more than prescribed Pain, Sleep and Anxiety Medications.  It is not advisable to combine anxiety,sleep and pain medications without talking with your primary care practitioner   Special Instructions: If you have smoked or chewed Tobacco  in the last 2 yrs please stop smoking, stop any regular Alcohol  and or any Recreational drug use.   Wear Seat belts while driving.   Please note: You were cared for by a hospitalist during your hospital stay. Once you are discharged, your primary care physician will handle any further medical issues. Please note that NO REFILLS for any discharge medications will be authorized once you are discharged, as it is imperative that you return to your primary care physician (or establish a relationship with a primary care physician if you do not have one) for your post hospital discharge needs so that they can reassess your need for medications and monitor your lab values.  Time coordinating discharge: 35 minutes  SIGNED:  Pamella Pertostin Raynee Mccasland, MD, PhD 05/22/2022, 1:36 PM

## 2022-05-22 NOTE — Progress Notes (Signed)
Removed IV, report called to [facility], CM updated of bed availability at 3pm

## 2022-05-22 NOTE — Progress Notes (Signed)
Pt is a 50 y.o.male who was admitted at [facility] due ETOH use disorder.  This is pt's 3rd admission in 4 months.  Pt is pleasant during admission process.  Pt states his mood is "pissed."  Pt does not like that he was admitted in an involuntarily status.  Pt reports increased stress at home caring for his elderly father and juggling living between his father and his girlfriend/son.  Pt said his goal is to learn more positive coping skills.  RN oriented pt to his unit, the room, and the staff.

## 2022-05-22 NOTE — Tx Team (Signed)
Initial Treatment Plan 05/22/2022 7:26 PM OBBIE LEWALLEN BSJ:[id number]    PATIENT STRESSORS: Health problems   Marital or family conflict   Substance abuse     PATIENT STRENGTHS: Communication skills  General fund of knowledge  Supportive family/friends    PATIENT IDENTIFIED PROBLEMS: Depression  Substance use disorder  Ineffective coping with life's stressors                 DISCHARGE CRITERIA:  Improved stabilization in mood, thinking, and/or behavior Reduction of life-threatening or endangering symptoms to within safe limits Withdrawal symptoms are absent or subacute and managed without 24-hour nursing intervention  PRELIMINARY DISCHARGE PLAN: Outpatient therapy  PATIENT/FAMILY INVOLVEMENT: This treatment plan has been presented to and reviewed with the patient, Eleuterio Dollar Alderfer. The patient has been given the opportunity to ask questions and make suggestions.  Garnette Scheuermann, RN 05/22/2022, 7:26 PM

## 2022-05-22 NOTE — Progress Notes (Signed)
The patient rated his day as a 5 out of 10 since he was admitted late in the afternoon.

## 2022-05-22 NOTE — TOC Progression Note (Signed)
Transition of Care [facility]) - Progression Note    Patient Details  Name: Keith Miller MRN: [id number] Date of Birth: Aug 29, 1971  Transition of [facility]) CM/SW Contact  Afifa Truax, Olegario Messier, RN Phone Number: 05/22/2022, 10:09 AM  Clinical Narrative: [facility] rep following  to accept today for IP Psych. Awaiting covid pcr results. Going to rm#[id number], report tel#[phone]. Once ready for d/c will call Sheriff-patient is IVC.    Expected Discharge Plan: Psychiatric Hospital Barriers to Discharge: Continued Medical Work up  Expected Discharge Plan and Services Expected Discharge Plan: Psychiatric Hospital                                               Social Determinants of Health (SDOH) Interventions    Readmission Risk Interventions    05/22/2022   10:09 AM  Readmission Risk Prevention Plan  Transportation Screening Complete  Medication Review (RN Care Manager) Complete  PCP or Specialist appointment within 3-5 days of discharge Complete  HRI or Home Care Consult Complete  SW Recovery Care/Counseling Consult Complete  Palliative Care Screening Not Applicable  Skilled Nursing Facility Not Applicable

## 2022-05-22 NOTE — Consult Note (Signed)
Patient continues to meet inpatient psychiatric criteria at this time.  Pt was accepted to [facility]  05/22/2022  ; Bed Assignment [id number]   Pt meets inpatient criteria per Caryn Bee, NP   Attending Physician will be Dr. Sherron Flemings   IN:[id number] Induced Mood Disorder   Report can be called to: - Adult unit:  [phone]     Please fax InVol(IVC) paperwork to [phone]-must have both before the patient can arrive at [facility].    Care team communication made with the following: Di Kindle Mahabir Theadora Rama, NP.

## 2022-05-22 NOTE — Plan of Care (Signed)
Problem: Safety: Goal: Non-violent Restraint(s) Outcome: Adequate for Discharge   Problem: Safety: Goal: Ability to remain free from injury will improve Outcome: Adequate for Discharge   Problem: Education: Goal: Knowledge of General Education information will improve Description: Including pain rating scale, medication(s)/side effects and non-pharmacologic comfort measures Outcome: Adequate for Discharge   Problem: Health Behavior/Discharge Planning: Goal: Ability to manage health-related needs will improve Outcome: Adequate for Discharge   Problem: Clinical Measurements: Goal: Ability to maintain clinical measurements within normal limits will improve Outcome: Adequate for Discharge Goal: Will remain free from infection Outcome: Adequate for Discharge Goal: Diagnostic test results will improve Outcome: Adequate for Discharge Goal: Respiratory complications will improve Outcome: Adequate for Discharge Goal: Cardiovascular complication will be avoided Outcome: Adequate for Discharge   Problem: Activity: Goal: Risk for activity intolerance will decrease Outcome: Adequate for Discharge   Problem: Nutrition: Goal: Adequate nutrition will be maintained Outcome: Adequate for Discharge   Problem: Coping: Goal: Level of anxiety will decrease Outcome: Adequate for Discharge   Problem: Elimination: Goal: Will not experience complications related to bowel motility Outcome: Adequate for Discharge Goal: Will not experience complications related to urinary retention Outcome: Adequate for Discharge   Problem: Pain Managment: Goal: General experience of comfort will improve Outcome: Adequate for Discharge   Problem: Safety: Goal: Ability to remain free from injury will improve Outcome: Adequate for Discharge   Problem: Skin Integrity: Goal: Risk for impaired skin integrity will decrease Outcome: Adequate for Discharge   Problem: Safety: Goal: Non-violent  Restraint(s) Outcome: Adequate for Discharge   Problem: Safety: Goal: Ability to remain free from injury will improve Outcome: Adequate for Discharge   Problem: Education: Goal: Knowledge of General Education information will improve Description: Including pain rating scale, medication(s)/side effects and non-pharmacologic comfort measures Outcome: Adequate for Discharge   Problem: Health Behavior/Discharge Planning: Goal: Ability to manage health-related needs will improve Outcome: Adequate for Discharge   Problem: Clinical Measurements: Goal: Ability to maintain clinical measurements within normal limits will improve Outcome: Adequate for Discharge Goal: Will remain free from infection Outcome: Adequate for Discharge Goal: Diagnostic test results will improve Outcome: Adequate for Discharge Goal: Respiratory complications will improve Outcome: Adequate for Discharge Goal: Cardiovascular complication will be avoided Outcome: Adequate for Discharge   Problem: Activity: Goal: Risk for activity intolerance will decrease Outcome: Adequate for Discharge   Problem: Nutrition: Goal: Adequate nutrition will be maintained Outcome: Adequate for Discharge   Problem: Coping: Goal: Level of anxiety will decrease Outcome: Adequate for Discharge   Problem: Elimination: Goal: Will not experience complications related to bowel motility Outcome: Adequate for Discharge Goal: Will not experience complications related to urinary retention Outcome: Adequate for Discharge   Problem: Pain Managment: Goal: General experience of comfort will improve Outcome: Adequate for Discharge   Problem: Safety: Goal: Ability to remain free from injury will improve Outcome: Adequate for Discharge   Problem: Skin Integrity: Goal: Risk for impaired skin integrity will decrease Outcome: Adequate for Discharge   Problem: Safety: Goal: Non-violent Restraint(s) Outcome: Adequate for Discharge    Problem: Safety: Goal: Ability to remain free from injury will improve Outcome: Adequate for Discharge   Problem: Education: Goal: Knowledge of General Education information will improve Description: Including pain rating scale, medication(s)/side effects and non-pharmacologic comfort measures Outcome: Adequate for Discharge   Problem: Health Behavior/Discharge Planning: Goal: Ability to manage health-related needs will improve Outcome: Adequate for Discharge   Problem: Clinical Measurements: Goal: Ability to maintain clinical measurements within normal limits will improve Outcome:  Adequate for Discharge Goal: Will remain free from infection Outcome: Adequate for Discharge Goal: Diagnostic test results will improve Outcome: Adequate for Discharge Goal: Respiratory complications will improve Outcome: Adequate for Discharge Goal: Cardiovascular complication will be avoided Outcome: Adequate for Discharge   Problem: Activity: Goal: Risk for activity intolerance will decrease Outcome: Adequate for Discharge   Problem: Nutrition: Goal: Adequate nutrition will be maintained Outcome: Adequate for Discharge   Problem: Coping: Goal: Level of anxiety will decrease Outcome: Adequate for Discharge   Problem: Elimination: Goal: Will not experience complications related to bowel motility Outcome: Adequate for Discharge Goal: Will not experience complications related to urinary retention Outcome: Adequate for Discharge   Problem: Pain Managment: Goal: General experience of comfort will improve Outcome: Adequate for Discharge   Problem: Safety: Goal: Ability to remain free from injury will improve Outcome: Adequate for Discharge   Problem: Skin Integrity: Goal: Risk for impaired skin integrity will decrease Outcome: Adequate for Discharge   Problem: Safety: Goal: Non-violent Restraint(s) Outcome: Adequate for Discharge   Problem: Safety: Goal: Ability to remain free from  injury will improve Outcome: Adequate for Discharge   Problem: Education: Goal: Knowledge of General Education information will improve Description: Including pain rating scale, medication(s)/side effects and non-pharmacologic comfort measures Outcome: Adequate for Discharge   Problem: Health Behavior/Discharge Planning: Goal: Ability to manage health-related needs will improve Outcome: Adequate for Discharge   Problem: Clinical Measurements: Goal: Ability to maintain clinical measurements within normal limits will improve Outcome: Adequate for Discharge Goal: Will remain free from infection Outcome: Adequate for Discharge Goal: Diagnostic test results will improve Outcome: Adequate for Discharge Goal: Respiratory complications will improve Outcome: Adequate for Discharge Goal: Cardiovascular complication will be avoided Outcome: Adequate for Discharge   Problem: Activity: Goal: Risk for activity intolerance will decrease Outcome: Adequate for Discharge   Problem: Nutrition: Goal: Adequate nutrition will be maintained Outcome: Adequate for Discharge   Problem: Coping: Goal: Level of anxiety will decrease Outcome: Adequate for Discharge   Problem: Elimination: Goal: Will not experience complications related to bowel motility Outcome: Adequate for Discharge Goal: Will not experience complications related to urinary retention Outcome: Adequate for Discharge   Problem: Pain Managment: Goal: General experience of comfort will improve Outcome: Adequate for Discharge   Problem: Safety: Goal: Ability to remain free from injury will improve Outcome: Adequate for Discharge   Problem: Skin Integrity: Goal: Risk for impaired skin integrity will decrease Outcome: Adequate for Discharge

## 2022-05-22 NOTE — TOC Transition Note (Addendum)
Transition of Care [facility]) - CM/SW Discharge Note   Patient Details  Name: ASIR BINGLEY MRN: [id number] Date of Birth: 1971/07/04  Transition of Care [facility]) CM/SW Contact:  Lanier Clam, RN Phone Number: 05/22/2022, 1:52 PM   Clinical Narrative:  Faxed covid results to [facility] w/confirmation fax#[phone];[facility] call report tel#[phone]. TC patient's sister Kim-informed of d/c to [facility]-voiced understanding. Will call Sheriff-pt is IVC forms @ [facility] station.No further CM needs.  -2p-Will call Sheriff @ 3p unable to schedule pick up prior. 3:27p-[place] [employer] [place] [employer] [place]C called [phone].Faxed w/confirmation to fax#[phone]-IVC paperwork to [facility].No further CM needs.    Final next level of care: [facility] Barriers to Discharge: No Barriers Identified   Patient Goals and CMS Choice        Discharge Placement                       Discharge Plan and Services                                     Social Determinants of Health (SDOH) Interventions     Readmission Risk Interventions    05/22/2022   10:09 AM  Readmission Risk Prevention Plan  Transportation Screening Complete  Medication Review (RN Care Manager) Complete  PCP or Specialist appointment within 3-5 days of discharge Complete  HRI or Home Care Consult Complete  SW Recovery Care/Counseling Consult Complete  Palliative Care Screening Not Applicable  Skilled Nursing Facility Not Applicable

## 2022-05-23 ENCOUNTER — Encounter (HOSPITAL_COMMUNITY): Payer: Self-pay

## 2022-05-23 DIAGNOSIS — F332 Major depressive disorder, recurrent severe without psychotic features: Secondary | ICD-10-CM

## 2022-05-23 LAB — COMPREHENSIVE METABOLIC PANEL
ALT: 25 U/L (ref 0–44)
AST: 25 U/L (ref 15–41)
Albumin: 3.9 g/dL (ref 3.5–5.0)
Alkaline Phosphatase: 31 U/L — ABNORMAL LOW (ref 38–126)
Anion gap: 8 (ref 5–15)
BUN: 17 mg/dL (ref 6–20)
CO2: 20 mmol/L — ABNORMAL LOW (ref 22–32)
Calcium: 8.9 mg/dL (ref 8.9–10.3)
Chloride: 109 mmol/L (ref 98–111)
Creatinine, Ser: 0.97 mg/dL (ref 0.61–1.24)
GFR, Estimated: >60 mL/min (ref >60)
Glucose, Bld: 96 mg/dL (ref 70–99)
Potassium: 3.7 mmol/L (ref 3.5–5.1)
Sodium: 137 mmol/L (ref 135–145)
Total Bilirubin: 0.8 mg/dL (ref 0.3–1.2)
Total Protein: 7.2 g/dL (ref 6.5–8.1)

## 2022-05-23 LAB — URINALYSIS, COMPLETE (UACMP) WITH MICROSCOPIC
Bacteria, UA: NONE SEEN
Bilirubin Urine: NEGATIVE
Glucose, UA: NEGATIVE mg/dL
Hgb urine dipstick: NEGATIVE
Ketones, ur: NEGATIVE mg/dL
Leukocytes,Ua: NEGATIVE
Nitrite: NEGATIVE
Protein, ur: NEGATIVE mg/dL
Specific Gravity, Urine: 1.024 (ref 1.005–1.030)
pH: 6 (ref 5.0–8.0)

## 2022-05-23 LAB — CBC
HCT: 42.6 % (ref 39.0–52.0)
Hemoglobin: 15.3 g/dL (ref 13.0–17.0)
MCH: 33.2 pg (ref 26.0–34.0)
MCHC: 35.9 g/dL (ref 30.0–36.0)
MCV: 92.4 fL (ref 80.0–100.0)
Platelets: 220 10*3/uL (ref 150–400)
RBC: 4.61 MIL/uL (ref 4.22–5.81)
RDW: 13.8 % (ref 11.5–15.5)
WBC: 4.6 10*3/uL (ref 4.0–10.5)
nRBC: 0 % (ref 0.0–0.2)

## 2022-05-23 LAB — HEMOGLOBIN A1C
Hgb A1c MFr Bld: 5.1 % (ref 4.8–5.6)
Mean Plasma Glucose: 99.67 mg/dL

## 2022-05-23 LAB — ETHANOL: Alcohol, Ethyl (B): <10 mg/dL (ref <10)

## 2022-05-23 LAB — LIPID PANEL
Cholesterol: 167 mg/dL (ref 0–200)
HDL: 52 mg/dL (ref >40)
LDL Cholesterol: 95 mg/dL (ref 0–99)
Total CHOL/HDL Ratio: 3.2 RATIO
Triglycerides: 98 mg/dL (ref <150)
VLDL: 20 mg/dL (ref 0–40)

## 2022-05-23 LAB — MAGNESIUM: Magnesium: 2 mg/dL (ref 1.7–2.4)

## 2022-05-23 LAB — TSH: TSH: 1.452 u[IU]/mL (ref 0.350–4.500)

## 2022-05-23 MED ORDER — TRAZODONE HCL 50 MG PO TABS
50.0000 mg | ORAL_TABLET | Freq: Once | ORAL | Status: AC
Start: 2022-05-23 — End: 2022-05-23
  Administered 2022-05-23: 50 mg via ORAL
  Filled 2022-05-23 (×2): qty 1

## 2022-05-23 MED ORDER — PROPRANOLOL HCL 10 MG PO TABS
10.0000 mg | ORAL_TABLET | Freq: Two times a day (BID) | ORAL | Status: DC
  Administered 2022-05-23 – 2022-05-25 (×4): 10 mg via ORAL
  Filled 2022-05-23 (×8): qty 1

## 2022-05-23 MED ORDER — ESCITALOPRAM OXALATE 10 MG PO TABS
10.0000 mg | ORAL_TABLET | Freq: Every day | ORAL | Status: DC
  Administered 2022-05-25: 10 mg via ORAL
  Filled 2022-05-23 (×2): qty 1

## 2022-05-23 MED ORDER — ESCITALOPRAM OXALATE 5 MG PO TABS
5.0000 mg | ORAL_TABLET | Freq: Every day | ORAL | Status: AC
  Administered 2022-05-24: 5 mg via ORAL
  Filled 2022-05-23 (×2): qty 1

## 2022-05-23 NOTE — [facility] Group Notes (Signed)
The focus of this group is to help patients establish daily goals to achieve during treatment and discuss how the patient can incorporate goal setting into their daily lives to aide in recovery.  Pt did not attend group 

## 2022-05-23 NOTE — BH IP Treatment Plan (Addendum)
Interdisciplinary Treatment and Diagnostic Plan Update  05/23/2022 Time of Session: 9:00am  Keith Miller MRN: [id number]  Principal Diagnosis: Substance induced mood disorder ([place])  Secondary Diagnoses: Principal Problem:   Substance induced mood disorder ([place]) Active Problems:   Alcohol-induced mood disorder ([place])   Current Medications:  Current Facility-Administered Medications  Medication Dose Route Frequency Provider Last Rate Last Admin   acetaminophen (TYLENOL) tablet 650 mg  650 mg Oral Q6H PRN Starkes-Perry, Gayland Curry, FNP       alum & mag hydroxide-simeth (MAALOX/MYLANTA) 200-200-20 MG/5ML suspension 30 mL  30 mL Oral Q6H PRN Starkes-Perry, Gayland Curry, FNP       feeding supplement (ENSURE ENLIVE / ENSURE PLUS) liquid 237 mL  237 mL Oral BID BM Massengill, Nathan, MD       folic acid (FOLVITE) tablet 1 mg  1 mg Oral Daily Suella Broad, FNP   1 mg at 05/23/22 0746   hydrOXYzine (ATARAX) tablet 25 mg  25 mg Oral Q6H PRN Laretta Bolster, FNP   25 mg at 05/22/22 2117   lip balm (CARMEX) ointment   Topical PRN Suella Broad, FNP       loperamide (IMODIUM) capsule 2-4 mg  2-4 mg Oral PRN Ntuen, Kris Hartmann, FNP       LORazepam (ATIVAN) tablet 1 mg  1 mg Oral Q6H PRN Ntuen, Kris Hartmann, FNP       LORazepam (ATIVAN) tablet 1 mg  1 mg Oral QID Ntuen, Kris Hartmann, FNP   1 mg at 05/23/22 0746   Followed by   Derrill Memo ON 05/24/2022] LORazepam (ATIVAN) tablet 1 mg  1 mg Oral TID Ntuen, Kris Hartmann, FNP       Followed by   Derrill Memo ON 05/25/2022] LORazepam (ATIVAN) tablet 1 mg  1 mg Oral BID Ntuen, Kris Hartmann, FNP       Followed by   Derrill Memo ON 05/26/2022] LORazepam (ATIVAN) tablet 1 mg  1 mg Oral Daily Ntuen, Tina C, FNP       magnesium hydroxide (MILK OF MAGNESIA) suspension 30 mL  30 mL Oral Daily PRN Starkes-Perry, Gayland Curry, FNP       multivitamin with minerals tablet 1 tablet  1 tablet Oral Daily Ntuen, Kris Hartmann, FNP   1 tablet at 05/23/22 0746   ondansetron (ZOFRAN-ODT) disintegrating tablet 4 mg  4 mg  Oral Q6H PRN Ntuen, Kris Hartmann, FNP       pantoprazole (PROTONIX) EC tablet 40 mg  40 mg Oral Daily Suella Broad, FNP   40 mg at 05/23/22 0746   thiamine (B-1) injection 100 mg  100 mg Intramuscular Once Ntuen, Kris Hartmann, FNP       thiamine tablet 100 mg  100 mg Oral Daily Ntuen, Tina C, FNP   100 mg at 05/23/22 0746   PTA Medications: Medications Prior to Admission  Medication Sig Dispense Refill Last Dose   escitalopram (LEXAPRO) 10 MG tablet Take 1 tablet (10 mg total) by mouth daily. 30 tablet 0    hydrOXYzine (ATARAX) 25 MG tablet Take 1 tablet (25 mg total) by mouth every 6 (six) hours as needed for anxiety. (Patient not taking: Reported on 03/31/2022) 15 tablet 0    pantoprazole (PROTONIX) 40 MG tablet Take 1 tablet (40 mg total) by mouth daily. 30 tablet 1    thiamine 100 MG tablet Take 1 tablet (100 mg total) by mouth daily.      traZODone (DESYREL) 50 MG tablet Take 1 tablet (  50 mg total) by mouth at bedtime as needed for sleep. 30 tablet 0     Patient Stressors: Health problems   Marital or family conflict   Substance abuse    Patient Strengths: Forensic psychologist fund of knowledge  Supportive family/friends   Treatment Modalities: Medication Management, Group therapy, Case management,  1 to 1 session with clinician, Psychoeducation, Recreational therapy.   Physician Treatment Plan for Primary Diagnosis: Substance induced mood disorder (HCC) Long Term Goal(s):     Short Term Goals:    Medication Management: Evaluate patient's response, side effects, and tolerance of medication regimen.  Therapeutic Interventions: 1 to 1 sessions, Unit Group sessions and Medication administration.  Evaluation of Outcomes: Progressing  Physician Treatment Plan for Secondary Diagnosis: Principal Problem:   Substance induced mood disorder (HCC) Active Problems:   Alcohol-induced mood disorder (HCC)  Long Term Goal(s):     Short Term Goals:       Medication Management:  Evaluate patient's response, side effects, and tolerance of medication regimen.  Therapeutic Interventions: 1 to 1 sessions, Unit Group sessions and Medication administration.  Evaluation of Outcomes: Progressing   RN Treatment Plan for Primary Diagnosis: Substance induced mood disorder (HCC) Long Term Goal(s): Knowledge of disease and therapeutic regimen to maintain health will improve  Short Term Goals: Ability to remain free from injury will improve, Ability to participate in decision making will improve, Ability to verbalize feelings will improve, Ability to disclose and discuss suicidal ideas, and Ability to identify and develop effective coping behaviors will improve  Medication Management: RN will administer medications as ordered by provider, will assess and evaluate patient's response and provide education to patient for prescribed medication. RN will report any adverse and/or side effects to prescribing provider.  Therapeutic Interventions: 1 on 1 counseling sessions, Psychoeducation, Medication administration, Evaluate responses to treatment, Monitor vital signs and CBGs as ordered, Perform/monitor CIWA, COWS, AIMS and Fall Risk screenings as ordered, Perform wound care treatments as ordered.  Evaluation of Outcomes: Progressing   LCSW Treatment Plan for Primary Diagnosis: Substance induced mood disorder (HCC) Long Term Goal(s): Safe transition to appropriate next level of care at discharge, Engage patient in therapeutic group addressing interpersonal concerns.  Short Term Goals: Engage patient in aftercare planning with referrals and resources, Increase social support, Increase emotional regulation, Facilitate acceptance of mental health diagnosis and concerns, Identify triggers associated with mental health/substance abuse issues, and Increase skills for wellness and recovery  Therapeutic Interventions: Assess for all discharge needs, 1 to 1 time with Social worker, Explore  available resources and support systems, Assess for adequacy in community support network, Educate family and significant other(s) on suicide prevention, Complete Psychosocial Assessment, Interpersonal group therapy.  Evaluation of Outcomes: Progressing   Progress in Treatment: Attending groups: No. Participating in groups: No. Taking medication as prescribed: Yes. Toleration medication: Yes. Family/Significant other contact made: Yes, individual(s) contacted:  If consents are provided  Patient understands diagnosis: Yes. Discussing patient identified problems/goals with staff: Yes. Medical problems stabilized or resolved: Yes. Denies suicidal/homicidal ideation: Yes. Issues/concerns per patient self-inventory: No.   New problem(s) identified: No, Describe:  None   New Short Term/Long Term Goal(s): medication stabilization, elimination of SI thoughts, development of comprehensive mental wellness plan.   Patient Goals:  "To go home"   Discharge Plan or Barriers: Patient recently admitted. CSW will continue to follow and assess for appropriate referrals and possible discharge planning.   Reason for Continuation of Hospitalization: Anxiety Depression Medication stabilization Suicidal ideation  Withdrawal symptoms  Estimated Length of Stay: 3 to 7 days   Last 3 [place] Suicide Severity Risk Score: Flowsheet Row [facility] Most recent reading at 05/22/2022  4:15 PM ED to Hosp-Admission (Discharged) from 05/18/2022 in [facility] Most recent reading at 05/20/2022  3:01 PM ED from 05/18/2022 in [facility] Most recent reading at 05/18/2022 11:39 AM  C-SSRS RISK CATEGORY No Risk No Risk No Risk       Last PHQ 2/9 Scores:    02/19/2022    9:59 AM  Depression screen PHQ 2/9  Decreased Interest 0  Down, Depressed, Hopeless 1  PHQ - 2 Score  1    Scribe for Treatment Team: Aram Beecham, Theresia Majors 05/23/2022 11:08 AM
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
## 2022-05-23 NOTE — BH IP Treatment Plan (Signed)
Interdisciplinary Treatment and Diagnostic Plan Update  05/23/2022 Time of Session: 9:00am  Keith Miller MRN: [id number]  Principal Diagnosis: Substance induced mood disorder ([place])  Secondary Diagnoses: Principal Problem:   Substance induced mood disorder ([place]) Active Problems:   Alcohol-induced mood disorder ([place])   Current Medications:  Current Facility-Administered Medications  Medication Dose Route Frequency Provider Last Rate Last Admin   acetaminophen (TYLENOL) tablet 650 mg  650 mg Oral Q6H PRN Starkes-Perry, Gayland Curry, FNP       alum & mag hydroxide-simeth (MAALOX/MYLANTA) 200-200-20 MG/5ML suspension 30 mL  30 mL Oral Q6H PRN Starkes-Perry, Gayland Curry, FNP       feeding supplement (ENSURE ENLIVE / ENSURE PLUS) liquid 237 mL  237 mL Oral BID BM Massengill, Nathan, MD       folic acid (FOLVITE) tablet 1 mg  1 mg Oral Daily Suella Broad, FNP   1 mg at 05/23/22 0746   hydrOXYzine (ATARAX) tablet 25 mg  25 mg Oral Q6H PRN Laretta Bolster, FNP   25 mg at 05/22/22 2117   lip balm (CARMEX) ointment   Topical PRN Suella Broad, FNP       loperamide (IMODIUM) capsule 2-4 mg  2-4 mg Oral PRN Ntuen, Kris Hartmann, FNP       LORazepam (ATIVAN) tablet 1 mg  1 mg Oral Q6H PRN Ntuen, Kris Hartmann, FNP       LORazepam (ATIVAN) tablet 1 mg  1 mg Oral QID Ntuen, Kris Hartmann, FNP   1 mg at 05/23/22 0746   Followed by   Derrill Memo ON 05/24/2022] LORazepam (ATIVAN) tablet 1 mg  1 mg Oral TID Ntuen, Kris Hartmann, FNP       Followed by   Derrill Memo ON 05/25/2022] LORazepam (ATIVAN) tablet 1 mg  1 mg Oral BID Ntuen, Kris Hartmann, FNP       Followed by   Derrill Memo ON 05/26/2022] LORazepam (ATIVAN) tablet 1 mg  1 mg Oral Daily Ntuen, Tina C, FNP       magnesium hydroxide (MILK OF MAGNESIA) suspension 30 mL  30 mL Oral Daily PRN Starkes-Perry, Gayland Curry, FNP       multivitamin with minerals tablet 1 tablet  1 tablet Oral Daily Ntuen, Kris Hartmann, FNP   1 tablet at 05/23/22 0746   ondansetron (ZOFRAN-ODT) disintegrating tablet 4 mg  4 mg  Oral Q6H PRN Ntuen, Kris Hartmann, FNP       pantoprazole (PROTONIX) EC tablet 40 mg  40 mg Oral Daily Suella Broad, FNP   40 mg at 05/23/22 0746   thiamine (B-1) injection 100 mg  100 mg Intramuscular Once Ntuen, Kris Hartmann, FNP       thiamine tablet 100 mg  100 mg Oral Daily Ntuen, Tina C, FNP   100 mg at 05/23/22 0746   PTA Medications: Medications Prior to Admission  Medication Sig Dispense Refill Last Dose   escitalopram (LEXAPRO) 10 MG tablet Take 1 tablet (10 mg total) by mouth daily. 30 tablet 0    hydrOXYzine (ATARAX) 25 MG tablet Take 1 tablet (25 mg total) by mouth every 6 (six) hours as needed for anxiety. (Patient not taking: Reported on 03/31/2022) 15 tablet 0    pantoprazole (PROTONIX) 40 MG tablet Take 1 tablet (40 mg total) by mouth daily. 30 tablet 1    thiamine 100 MG tablet Take 1 tablet (100 mg total) by mouth daily.      traZODone (DESYREL) 50 MG tablet Take 1 tablet (  50 mg total) by mouth at bedtime as needed for sleep. 30 tablet 0     Patient Stressors: Health problems   Marital or family conflict   Substance abuse    Patient Strengths: Marketing executive fund of knowledge  Supportive family/friends   Treatment Modalities: Medication Management, Group therapy, Case management,  1 to 1 session with clinician, Psychoeducation, Recreational therapy.   Physician Treatment Plan for Primary Diagnosis: Substance induced mood disorder ([place]) Long Term Goal(s):     Short Term Goals:    Medication Management: Evaluate patient's response, side effects, and tolerance of medication regimen.  Therapeutic Interventions: 1 to 1 sessions, Unit Group sessions and Medication administration.  Evaluation of Outcomes: Not Met  Physician Treatment Plan for Secondary Diagnosis: Principal Problem:   Substance induced mood disorder ([place]) Active Problems:   Alcohol-induced mood disorder ([place])  Long Term Goal(s):     Short Term Goals:       Medication Management:  Evaluate patient's response, side effects, and tolerance of medication regimen.  Therapeutic Interventions: 1 to 1 sessions, Unit Group sessions and Medication administration.  Evaluation of Outcomes: Not Met   RN Treatment Plan for Primary Diagnosis: Substance induced mood disorder ([place]) Long Term Goal(s): Knowledge of disease and therapeutic regimen to maintain health will improve  Short Term Goals: Ability to remain free from injury will improve, Ability to participate in decision making will improve, Ability to verbalize feelings will improve, Ability to disclose and discuss suicidal ideas, and Ability to identify and develop effective coping behaviors will improve  Medication Management: RN will administer medications as ordered by provider, will assess and evaluate patient's response and provide education to patient for prescribed medication. RN will report any adverse and/or side effects to prescribing provider.  Therapeutic Interventions: 1 on 1 counseling sessions, Psychoeducation, Medication administration, Evaluate responses to treatment, Monitor vital signs and CBGs as ordered, Perform/monitor CIWA, COWS, AIMS and Fall Risk screenings as ordered, Perform wound care treatments as ordered.  Evaluation of Outcomes: Not Met   LCSW Treatment Plan for Primary Diagnosis: Substance induced mood disorder ([place]) Long Term Goal(s): Safe transition to appropriate next level of care at discharge, Engage patient in therapeutic group addressing interpersonal concerns.  Short Term Goals: Engage patient in aftercare planning with referrals and resources, Increase social support, Increase emotional regulation, Facilitate acceptance of mental health diagnosis and concerns, Identify triggers associated with mental health/substance abuse issues, and Increase skills for wellness and recovery  Therapeutic Interventions: Assess for all discharge needs, 1 to 1 time with Social worker, Explore available  resources and support systems, Assess for adequacy in community support network, Educate family and significant other(s) on suicide prevention, Complete Psychosocial Assessment, Interpersonal group therapy.  Evaluation of Outcomes: Not Met   Progress in Treatment: Attending groups: No. Participating in groups: No. Taking medication as prescribed: Yes. Toleration medication: Yes. Family/Significant other contact made: Yes, individual(s) contacted:  If consents are provided  Patient understands diagnosis: Yes. Discussing patient identified problems/goals with staff: Yes. Medical problems stabilized or resolved: Yes. Denies suicidal/homicidal ideation: Yes. Issues/concerns per patient self-inventory: No.   New problem(s) identified: No, Describe:  None   New Short Term/Long Term Goal(s): medication stabilization, elimination of SI thoughts, development of comprehensive mental wellness plan.   Patient Goals:  "To go home"  Discharge Plan or Barriers: Patient recently admitted. CSW will continue to follow and assess for appropriate referrals and possible discharge planning.   Reason for Continuation of Hospitalization: Anxiety Depression Medication  stabilization Suicidal ideation Withdrawal symptoms  Estimated Length of Stay: 3 to 7 days   Last 3 [place] Suicide Severity Risk Score: Flowsheet Row Admission (Current) from 05/22/2022 in [facility] Most recent reading at 05/22/2022  4:15 PM ED to Hosp-Admission (Discharged) from 05/18/2022 in [facility] Most recent reading at 05/20/2022  3:01 PM ED from 05/18/2022 in [facility] DEPT Most recent reading at 05/18/2022 11:39 AM  C-SSRS RISK CATEGORY No Risk No Risk No Risk       Last PHQ 2/9 Scores:    02/19/2022    9:59 AM  Depression screen PHQ 2/9  Decreased Interest 0  Down, Depressed, Hopeless 1  PHQ - 2 Score 1    Scribe  for Treatment Team: Darleen Crocker, Latanya Presser 05/23/2022 11:14 AM

## 2022-05-23 NOTE — Group Note (Signed)
LCSW Group Therapy Note   Group Date: 05/23/2022 Start Time: 1300 End Time: 1400   Type of Therapy and Topic:  Group Therapy: Boundaries  Participation Level:  Active  Description of Group: This group will address the use of boundaries in their personal lives. Patients will explore why boundaries are important, the difference between healthy and unhealthy boundaries, and negative and postive outcomes of different boundaries and will look at how boundaries can be crossed.  Patients will be encouraged to identify current boundaries in their own lives and identify what kind of boundary is being set. Facilitators will guide patients in utilizing problem-solving interventions to address and correct types boundaries being used and to address when no boundary is being used. Understanding and applying boundaries will be explored and addressed for obtaining and maintaining a balanced life. Patients will be encouraged to explore ways to assertively make their boundaries and needs known to significant others in their lives, using other group members and facilitator for role play, support, and feedback.  Therapeutic Goals:  1.  Patient will identify areas in their life where setting clear boundaries could be  used to improve their life.  2.  Patient will identify signs/triggers that a boundary is not being respected. 3.  Patient will identify two ways to set boundaries in order to achieve balance in  their lives: 4.  Patient will demonstrate ability to communicate their needs and set boundaries  through discussion and/or role plays  Summary of Patient Progress:  Patient was present for the entirety of the group session. Patient was an active listener and participated in the topic of discussion, provided helpful advice to others, and added nuance to topic of conversation. Patient shared that he needs to work on his time boundaries, states he needs to make more time for his family and learn to say no.    Therapeutic Modalities:   Cognitive Behavioral Therapy Solution-Focused Therapy  Corky Crafts, Theresia Majors 05/23/2022  2:08 PM

## 2022-05-23 NOTE — Group Note (Signed)
Recreation Therapy Group Note   Group Topic:Team Building  Group Date: 05/23/2022 Start Time: 0930 End Time: 1000 Facilitators: Caroll Rancher, LRT,CTRS Location: [address] Dayroom   Goal Area(s) Addresses:  Patient will effectively work with peer towards shared goal.  Patient will identify skills used to make activity successful.  Patient will identify how skills used during activity can be applied to reach post d/c goals.    Group Description: [employer]. In teams of 5-6, patients were given 25 small craft pipe cleaners. Using the materials provided, patients were instructed to compete again the opposing team(s) to build the tallest free-standing structure from floor level. The activity was timed; difficulty increased by Clinical research associate as Production designer, theatre/television/film continued.  Systematically resources were removed with additional directions for example, placing one arm behind their back, working in silence, and shape stipulations. LRT facilitated post-activity discussion reviewing team processes and necessary communication skills involved in completion. Patients were encouraged to reflect how the skills utilized, or not utilized, in this activity can be incorporated to positively impact support systems post discharge.   Affect/Mood: N/A   Participation Level: Did not attend    Clinical Observations/Individualized Feedback:     Plan: Continue to engage patient in RT group sessions 2-3x/week.   Caroll Rancher, LRT,CTRS 05/23/2022 10:47 AM

## 2022-05-23 NOTE — Progress Notes (Signed)
   05/23/22 0000  Psych Admission Type (Psych Patients Only)  Admission Status Involuntary  Psychosocial Assessment  Patient Complaints Anxiety;Depression;Worrying  Eye Contact Fair  Facial Expression Sad;Blank;Anxious  Affect Sad;Depressed  Speech Logical/coherent  Interaction Assertive  Motor Activity Fidgety;Tremors  Appearance/Hygiene Production designer, theatre/television/film Cooperative;Appropriate to situation  Mood Anxious;Depressed  Thought Process  Coherency WDL  Content WDL  Delusions None reported or observed  Perception WDL  Hallucination None reported or observed  Judgment Impaired  Confusion None  Danger to Self  Current suicidal ideation? Denies  Danger to Others  Danger to Others None reported or observed

## 2022-05-23 NOTE — BHH Group Notes (Signed)
Adult Psychoeducational Group Note  Date:  05/23/2022 Time:  11:11 AM  Group Topic/Focus:  Healthy Communication:   The focus of this group is to discuss communication, barriers to communication, as well as healthy ways to communicate with others.  Participation Level:  Did Not Attend    Dub Mikes 05/23/2022, 11:11 AM

## 2022-05-23 NOTE — [facility] Suicide Risk Assessment (Addendum)
Suicide Risk Assessment  Admission Assessment    [facility] Admission Suicide Risk Assessment   Nursing information obtained from:  Patient Demographic factors:  Male, Caucasian Current Mental Status:  NA Loss Factors:  Decline in physical health Historical Factors:  Prior suicide attempts, Impulsivity Risk Reduction Factors:  Responsible for children under 52 years of age, Sense of responsibility to family  Total Time spent with patient: 1 hour Principal Problem: MDD (major depressive disorder), recurrent episode, severe (HCC) Diagnosis:  Principal Problem:   MDD (major depressive disorder), recurrent episode, severe (HCC) Active Problems:   Alcohol use disorder, severe, dependence (HCC)  Reason for admission: Keith Miller is a 51 year old male who presented to [facility] from [facility] with alcohol intoxication and withdrawal symptoms. Patient has history of multiple ED visits and [facility] admissions for alcohol use disorder, intoxication and dependence.  Patient has psychiatric history of MDD, (major depressive d/o), recurrent episode, severe, Alcohol use d/o, severe, dependence, Alcohol w/d syndrome with complications, Alcohol W/D, SI, Anxiety D/O.  He is currently separated from girlfriend and moved in to live with his 85 years old father. As per admissions assessment, alcohol intoxication led to pt falling on his elderly father , and clinical symptoms below render patient a danger to self and others, and continuous hospitalization is necessary to treat and stabilize his symptoms.   05-20-2022: Patient gave Dr. Sherron Flemings consent to talk to his girlfriend, [name], [phone].  [name] states the patient has been drinking excessively "to pass out" since Apr 30, 2022.  She reports it appears sobriety for 3 weeks prior to this.  She reports he is unable to take care of himself, becomes intoxicated and "shits all over the house".  She reports that on Thursday, the patient sent her a text message that he "wanted to  kill himself". (Of note, the patient did have a thoughts or thoughts that life was not worth living or that he would be better off dead, leading up to this hospitalization within the past few weeks.)   Continued Clinical Symptoms: Severe Anxiety and/or Agitation Panic Attacks Depression:   Anhedonia Comorbid alcohol abuse/dependence Hopelessness, helplessness Insomnia Alcohol/Substance Abuse/Dependencies More than one psychiatric diagnosis Previous Psychiatric Diagnoses and Treatments As per admissions assessment, alcohol intoxication led to pt falling on his elderly father. Symptoms above render patient a danger to self and others, and continuous hospitalization is necessary to treat and stabilize his symptoms.   Alcohol Use Disorder Identification Test Final Score (AUDIT): 29 The "Alcohol Use Disorders Identification Test", Guidelines for Use in Primary Care, Second Edition.  World Science writer [facility]). Score between 0-7:  no or low risk or alcohol related problems. Score between 8-15:  moderate risk of alcohol related problems. Score between 16-19:  high risk of alcohol related problems. Score 20 or above:  warrants further diagnostic evaluation for alcohol dependence and treatment.  CLINICAL FACTORS:   Severe Anxiety and/or Agitation Panic Attacks Depression:   Anhedonia Comorbid alcohol abuse/dependence Hopelessness Insomnia Alcohol/Substance Abuse/Dependencies More than one psychiatric diagnosis Previous Psychiatric Diagnoses and Treatments  Musculoskeletal: Strength & Muscle Tone: within normal limits Gait & Station: normal Patient leans: N/A  Psychiatric Specialty Exam:  Presentation  General Appearance: Appropriate for Environment; Casual; Fairly Groomed  Eye Contact:Fair  Speech:Clear and Coherent; Normal Rate  Speech Volume:Normal  Handedness:Right  Mood and Affect  Mood:Anxious; Depressed; Hopeless; Irritable  Affect:Appropriate; Congruent;  Depressed  Thought Process  Thought Processes:Coherent; Linear  Descriptions of Associations:Intact  Orientation:Full (Time, Place and Person)  Thought Content:Logical; WDL  History of Schizophrenia/Schizoaffective disorder:No  Duration of Psychotic Symptoms:No data recorded Hallucinations:Hallucinations: None  Ideas of Reference:None  Suicidal Thoughts:Suicidal Thoughts: No  Homicidal Thoughts:Homicidal Thoughts: No  Sensorium  Memory:Immediate Fair; Recent Fair; Remote Fair  Judgment:Fair  Insight:Fair  Executive Functions  Concentration:Fair  Attention Span:Fair  Recall:Fair  Fund of Knowledge:Fair  Language:Good  Psychomotor Activity  Psychomotor Activity:Psychomotor Activity: Tremor  Assets  Assets:Communication Skills; Physical Health  Sleep  Sleep:Sleep: Fair Number of Hours of Sleep: 4  Physical Exam: Physical Exam Vitals and nursing note reviewed.  Constitutional:      Appearance: Normal appearance.  HENT:     Head: Normocephalic and atraumatic.     Right Ear: External ear normal.     Left Ear: External ear normal.     Nose: Nose normal.     Mouth/Throat:     Mouth: Mucous membranes are moist.     Pharynx: Oropharynx is clear.  Eyes:     Extraocular Movements: Extraocular movements intact.     Conjunctiva/sclera: Conjunctivae normal.     Pupils: Pupils are equal, round, and reactive to light.  Cardiovascular:     Rate and Rhythm: Tachycardia present.     Comments: P 102, Nursing staff to recheck Pulmonary:     Effort: Pulmonary effort is normal.  Abdominal:     Palpations: Abdomen is soft.  Genitourinary:    Comments: deferred Musculoskeletal:        General: Normal range of motion.     Cervical back: Normal range of motion and neck supple.  Skin:    General: Skin is warm.  Neurological:     General: No focal deficit present.     Mental Status: He is alert and oriented to person, place, and time.  Psychiatric:         Behavior: Behavior normal.   Review of Systems  Constitutional: Negative.  Negative for chills and fever.  Eyes: Negative.  Negative for blurred vision and double vision.  Respiratory: Negative.  Negative for cough, sputum production, shortness of breath and wheezing.   Cardiovascular: Negative.  Negative for chest pain and palpitations.       P 102, Nursing staff to recheck   Gastrointestinal: Negative.  Negative for abdominal pain, constipation, diarrhea, heartburn, nausea and vomiting.  Genitourinary: Negative.  Negative for dysuria, frequency and urgency.  Musculoskeletal:  Positive for falls (Hx of falls.). Negative for back pain, joint pain, myalgias and neck pain.  Skin: Negative.  Negative for itching and rash.       Hx of Rosaceae   Neurological:  Negative for dizziness, tingling, tremors, sensory change, speech change, focal weakness, seizures, loss of consciousness, weakness and headaches.  Endo/Heme/Allergies: Negative.  Negative for environmental allergies and polydipsia. Does not bruise/bleed easily.  Psychiatric/Behavioral:  Positive for depression, substance abuse and suicidal ideas. The patient is nervous/anxious and has insomnia.   Blood pressure 130/89, pulse (!) 102, temperature (!) 97.5 F (36.4 C), temperature source Oral, resp. rate 20, height 6' (1.829 m), weight 86.9 kg, SpO2 99 %. Body mass index is 25.99 kg/m.   COGNITIVE FEATURES THAT CONTRIBUTE TO RISK:  Closed-mindedness    SUICIDE RISK:   Moderate:  Frequent suicidal ideation with limited intensity, and duration, some specificity in terms of plans, no associated intent, good self-control, limited dysphoria/symptomatology, some risk factors present, and identifiable protective factors, including available and accessible social support.   Treatment Plan Summary: Daily contact with patient to assess and evaluate symptoms  and progress in treatment and Medication management  Observation Level/Precautions:  15  minute checks  Laboratory:  Labs reviewed   Psychotherapy:  Unit Group sessions  [facility]  [facility]  Consultations:  To be determined   Discharge Concerns:  Safety, medication compliance, mood stability  Estimated LOS: 5-7 days  Other:  N/A   Physician Treatment Plan for Primary Diagnosis:  MDD (major depressive disorder), recurrent episode, severe (HCC) Principal Problem:  Active Problems:   Alcohol use disorder, severe, dependence (HCC)  PLAN Safety and Monitoring: Voluntary admission to inpatient psychiatric unit for safety, stabilization and treatment Daily contact with patient to assess and evaluate symptoms and progress in treatment Patient's case to be discussed in multi-disciplinary team meeting Observation Level : q15 minute checks Vital signs: q12 hours Precautions: suicide, seizures   Psychiatric Medications:  See H&P for plan    Other Medications -Initiate Ativan Detox Protocol for alcohol W/D symptoms -Initiate MVI for supplement -Initiate Thiamine tablet 100 mg po daily for supplement -Initiate Folic Acid 1 mg po daily for supplement -Initiate Pantoprazole EC tablet 40 mg po daily -Initiate Propranolol 10 mg po Q 12 hours for anxiety and BP -Initiate Lip balm ointment for dry lips. -Initiate Hydroxyzine 25 mg every 6 hours PRN -Initiate Nicotine Replacement Protocol   Other PRNS -Continue Tylenol 650 mg every 6 hours PRN for mild pain -Continue Maalox 30 mg every 4 hrs PRN for indigestion -Continue Imodium 2-4 mg as needed for diarrhea -Continue Milk of Magnesia as needed every 6 hrs for constipation -Continue Zofran disintegrating tabs every 6 hrs PRN for nausea   Long Term Goal(s): Improvement in symptoms so as ready for discharge  Short Term Goals: Ability to identify changes in lifestyle to reduce recurrence of condition will improve, Ability to verbalize feelings will improve, Ability to disclose and discuss suicidal ideas, Ability to demonstrate  self-control will improve, Ability to identify and develop effective coping behaviors will improve, Ability to maintain clinical measurements within normal limits will improve, Compliance with prescribed medications will improve, and Ability to identify triggers associated with substance abuse/mental health issues will improve  Physician Treatment Plan for Secondary Diagnosis:   Discharge Planning: Social work and case management to assist with discharge planning and identification of hospital follow-up needs prior to discharge Estimated LOS: 5-7 days Discharge Concerns: Need to establish a safety plan; Medication compliance and effectiveness Discharge Goals: Return home with outpatient referrals for mental health follow-up including medication management/psychotherapy  I certify that inpatient services furnished can reasonably be expected to improve the patient's condition.    Cecilie Lowersina C Ntuen, FNP [id number]/[id number] PM  I certify that inpatient services furnished can reasonably be expected to improve the patient's condition.   Total Time Spent in Direct Patient Care:  I personally spent 60 minutes on the unit in direct patient care. The direct patient care time included face-to-face time with the patient, reviewing the patient's chart, communicating with other professionals, and coordinating care. Greater than 50% of this time was spent in counseling or coordinating care with the patient regarding goals of hospitalization, psycho-education, and discharge planning needs.  I have independently evaluated the patient during a face-to-face assessment on 05/23/22. I reviewed the patient's chart, and I participated in key portions of the service. I discussed the case with the APP, and I agree with the assessment and plan of care as documented in the APP's note , as addended by me or notated below:  I directly edited the SRA, as above.  Janine Limbo, MD Psychiatrist

## 2022-05-23 NOTE — Progress Notes (Signed)
NUTRITION ASSESSMENT  Pt identified as at risk on the Malnutrition Screen Tool  INTERVENTION: 1. Supplements: Ensure Plus High Protein po BID, each supplement provides 350 kcal and 20 grams of protein.   NUTRITION DIAGNOSIS: Unintentional weight loss related to sub-optimal intake as evidenced by pt report.   Goal: Pt to meet >/= 90% of their estimated nutrition needs.  Monitor:  PO intake  Assessment:  Pt admitted for depression, anxiety and history of EtOH abuse. Transferred from [facility] yesterday.  Per weight records, pt has lost 28 lbs since 03/30/22 (12% wt loss x 2 months, significant for time frame). Pt receiving MVI, thiamine and folic acid. Ensure supplements are ordered.   Height: Ht Readings from Last 1 Encounters:  05/22/22 6' (1.829 m)    Weight: Wt Readings from Last 1 Encounters:  05/22/22 86.9 kg    Weight Hx: Wt Readings from Last 10 Encounters:  05/22/22 86.9 kg  05/20/22 88.7 kg  05/04/22 99.8 kg  03/30/22 99.8 kg  03/04/22 94 kg  02/28/22 95.3 kg  02/13/22 93.4 kg  02/12/22 90.7 kg  02/10/22 90.7 kg  01/21/22 95.3 kg    BMI:  Body mass index is 25.99 kg/m. Pt meets criteria for overweight based on current BMI.  Estimated Nutritional Needs: Kcal: 25-30 kcal/kg Protein: > 1 gram protein/kg Fluid: 1 ml/kcal  Diet Order:  Diet Order             Diet regular Room service appropriate? Yes; Fluid consistency: Thin  Diet effective now                  Pt is also offered choice of unit snacks mid-morning and mid-afternoon.  Pt is eating as desired.   Lab results and medications reviewed.   Clayton Bibles, MS, RD, LDN Inpatient Clinical Dietitian Contact information available via Amion

## 2022-05-23 NOTE — Progress Notes (Signed)
Pt denies SI/HI/AVH.  Pt asking to be discharged after being on the unit for less than a day.  Pt sad and depressed throughout the day.  Flat affect.  Pt is attending groups and interacting with peers.  Pt's heart rate was up this morning; responded well to propranolol.  RN will continue to monitor pt's progress and intervene as needed.

## 2022-05-23 NOTE — H&P (Addendum)
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 Patient has history of multiple ED visits and [facility] admissions for alcohol use disorder, intoxication and dependence.  Patient has psychiatric history of MDD, (major depressive d/o), recurrent episode, severe, Alcohol use d/o, severe, dependence, Alcohol w/d syndrome with complications, Alcohol W/D, SI, Anxiety D/O.  He is currently separated from girlfriend and moved in to live with his 52 years old father. As per admissions assessment, alcohol intoxication led to pt falling on his elderly father, and clinical symptoms below render patient a danger to self and others, and continuous hospitalization is necessary to treat and stabilize his symptoms.   PTA psych meds: pt not taking prescribed psych meds before admission   On assessment today, patient reported that he has been drinking a lot and on Friday 05/18/22, he woke up and was feeling terribly from drinking and decided to get some help. He reported that this has been happening frequently recently and having to present to Emergency Room at the hospital. Reported he started drinking heavily since 1994 and had been treated in 2017 at [facility] for 30 days in IOP. Reported being sober for 41/2 years after the treatment.   He reports mood anxious, depressed, irritable and hopeless. Endorsed  anhedonia, guilt, helplessness, and anxiety. Reports decrease sleep and sleeping 4 hours last night. Report decreased appetite and added that it is improving while in the hospital. Denies having suicidal thoughts or homicidal thoughts. Reports insufficient energy and decrease in motivation. Reports feeling anxious, and excessive worry.  Denies having symptoms meeting criteria for hypomania or mania at this time or in the past. Denies having any psychotic symptoms.  Denies any history of exposure to life threatening experience. Denies hx of abuse or trauma.  Patient is admitted for stabilization, medication management and safety.    05-20-2022: Patient gave this writer a consent to talk to his girlfriend, [name], [phone].  [name] states the patient has been drinking excessively "to pass out" since Apr 30, 2022.  She reports it appears sobriety for 3 weeks prior to this.  She reports he is unable to take care of himself, becomes intoxicated and "shits all over the house".  She reports that on Thursday, the patient sent her a text message that he "wanted to kill himself". (Of note, the patient did have a thoughts or thoughts that life was not worth living or that he would be better off dead, leading up to this hospitalization within the past few weeks.)    Total Time spent with patient: 1 hour  Past Psychiatric History: Inpatient at [facility] 01/2022 for MDD and Etoh Use disorder Previous psych meds: Lexapro  , Naltrexone , Trazodone      Is the patient at risk to self? yes  Has the patient been a risk to self in the past 6 months? No.  Has the patient been a risk to self within the distant past? No.  Is the patient a risk to others? yes  Has  the patient been a risk to others in the past 6 months? yes  Has the patient been a risk to others within the distant past? yes   Prior Inpatient Therapy:   Prior Outpatient Therapy:    Alcohol Screening: 1. How often do you have a drink  containing alcohol?: 4 or more times a week 2. How many drinks containing alcohol do you have on a typical day when you are drinking?: 7, 8, or 9 3. How often do you have six or more drinks on one occasion?: Daily or almost daily AUDIT-C Score: 11 4. How often during the last year have you found that you were not able to stop drinking once you had started?: Daily or almost daily 5. How often during the last year have you failed to do what was normally expected from you because of drinking?: Weekly 6. How often during the last year have you needed a first drink in the morning to get yourself going after a heavy drinking session?: Monthly 7. How often during the last year have you had a feeling of guilt of remorse after drinking?: Monthly 8. How often during the last year have you been unable to remember what happened the night before because you had been drinking?: Weekly 9. Have you or someone else been injured as a result of your drinking?: No 10. Has a relative or friend or a doctor or another health worker been concerned about your drinking or suggested you cut down?: Yes, during the last year Alcohol Use Disorder Identification Test Final Score (AUDIT): 29 Alcohol Brief Interventions/Follow-up: Alcohol education/Brief advice Substance Abuse History in the last 12 months:  Yes.   Consequences of Substance Abuse: Medical Consequences:  Dts, Abdominal pains Legal Consequences:  DWI pending Family Consequences:  Lost of his home , and moving in with his father DT's: Alcohol withdrawal Withdrawal Symptoms:   Cramps Nausea Tremors Previous Psychotropic Medications: Yes  Psychological Evaluations: Yes  Past Medical History:  Past Medical History:  Diagnosis Date   ETOH abuse     Past Surgical History:  Procedure Laterality Date   ANKLE SURGERY     KNEE SURGERY     Family History: History reviewed. No pertinent family history.  Family Psychiatric  History: Mother had severe anxiety  and depression  Tobacco Screening:  Denied smoking  Social History:  Social History   Substance and Sexual Activity  Alcohol Use Yes   Comment: 3-4 half pints of vodka QD     Social History   Substance and Sexual Activity  Drug Use Not Currently    Additional Social History:   Allergies:  No Known Allergies Lab Results:  Results for orders placed or performed during the hospital encounter of 05/22/22 (from the past 48 hour(s))  Urinalysis, Complete w Microscopic Urine, Clean Catch     Status: None   Collection Time: 05/22/22  6:13 PM  Result Value Ref Range   Color, Urine YELLOW YELLOW   APPearance CLEAR CLEAR   Specific Gravity, Urine 1.024 1.005 - 1.030   pH 6.0 5.0 - 8.0   Glucose, UA NEGATIVE NEGATIVE mg/dL   Hgb urine dipstick NEGATIVE NEGATIVE   Bilirubin Urine NEGATIVE NEGATIVE   Ketones, ur NEGATIVE NEGATIVE mg/dL   Protein, ur NEGATIVE NEGATIVE mg/dL   Nitrite NEGATIVE NEGATIVE   Leukocytes,Ua NEGATIVE NEGATIVE   WBC, UA 0-5 0 - 5 WBC/hpf   Bacteria, UA NONE SEEN NONE SEEN   Squamous Epithelial / LPF 0-5 0 - 5  Mucus PRESENT     Comment: Performed at [facility], [address] [place], [place] [address]  CBC     Status: None   Collection Time: 05/23/22  6:20 AM  Result Value Ref Range   WBC 4.6 4.0 - 10.5 K/uL   RBC 4.61 4.22 - 5.81 MIL/uL   Hemoglobin 15.3 13.0 - 17.0 g/dL   HCT 81.1 91.4 - 78.2 %   MCV 92.4 80.0 - 100.0 fL   MCH 33.2 26.0 - 34.0 pg   MCHC 35.9 30.0 - 36.0 g/dL   RDW 95.6 21.3 - 08.6 %   Platelets 220 150 - 400 K/uL   nRBC 0.0 0.0 - 0.2 %    Comment: Performed at [facility], [address], [place], [place] [address]  Comprehensive metabolic panel     Status: Abnormal   Collection Time: 05/23/22  6:20 AM  Result Value Ref Range   Sodium 137 135 - 145 mmol/L   Potassium 3.7 3.5 - 5.1 mmol/L   Chloride 109 98 - 111 mmol/L   CO2 20 (L) 22 - 32 mmol/L   Glucose, Bld 96 70 - 99 mg/dL     Comment: Glucose reference range applies only to samples taken after fasting for at least 8 hours.   BUN 17 6 - 20 mg/dL   Creatinine, Ser 9.62 0.61 - 1.24 mg/dL   Calcium 8.9 8.9 - 95.2 mg/dL   Total Protein 7.2 6.5 - 8.1 g/dL   Albumin 3.9 3.5 - 5.0 g/dL   AST 25 15 - 41 U/L   ALT 25 0 - 44 U/L   Alkaline Phosphatase 31 (L) 38 - 126 U/L   Total Bilirubin 0.8 0.3 - 1.2 mg/dL   GFR, Estimated >84 >13 mL/min    Comment: (NOTE) Calculated using the CKD-EPI Creatinine Equation (2021)    Anion gap 8 5 - 15    Comment: Performed at [facility], [address], [place], [place] [address]  Hemoglobin A1c     Status: None   Collection Time: 05/23/22  6:20 AM  Result Value Ref Range   Hgb A1c MFr Bld 5.1 4.8 - 5.6 %    Comment: (NOTE) Pre diabetes:          5.7%-6.4%  Diabetes:              >6.4%  Glycemic control for   <7.0% adults with diabetes    Mean Plasma Glucose 99.67 mg/dL    Comment: Performed at [facility], [address] [place], [place] [address]  Magnesium     Status: None   Collection Time: 05/23/22  6:20 AM  Result Value Ref Range   Magnesium 2.0 1.7 - 2.4 mg/dL    Comment: Performed at [facility], [address] [place], [place] [address]  Ethanol     Status: None   Collection Time: 05/23/22  6:20 AM  Result Value Ref Range   Alcohol, Ethyl (B) <10 <10 mg/dL    Comment: (NOTE) Lowest detectable limit for serum alcohol is 10 mg/dL.  For medical purposes only. Performed at [facility], [address] [place], [place] [address]   Lipid panel     Status: None   Collection Time: 05/23/22  6:20 AM  Result Value Ref Range   Cholesterol 167 0 - 200 mg/dL   Triglycerides 98 <259 mg/dL   HDL 52 >56 mg/dL   Total CHOL/HDL  Ratio 3.2 RATIO   VLDL 20 0 - 40 mg/dL   LDL Cholesterol 95 0 - 99 mg/dL    Comment:        Total Cholesterol/HDL:CHD Risk Coronary Heart Disease Risk Table                      Men   Women  1/2 Average Risk   3.4   3.3  Average Risk       5.0   4.4  2 X Average Risk   9.6   7.1  3 X Average Risk  23.4   11.0        Use the calculated Patient Ratio above and the CHD Risk Table to determine the patient's CHD Risk.        ATP III CLASSIFICATION (LDL):  <100     mg/dL   Optimal  161-096  mg/dL   Near or Above                    Optimal  130-159  mg/dL   Borderline  045-409  mg/dL   High  >811     mg/dL   Very High Performed at [facility], [address], [place], [place] [address]   TSH     Status: None   Collection Time: 05/23/22  6:20 AM  Result Value Ref Range   TSH 1.452 0.350 - 4.500 uIU/mL    Comment: Performed by a 3rd Generation assay with a functional sensitivity of <=0.01 uIU/mL. Performed at [facility], [address] [place], [place] [address]     Blood Alcohol level:  Lab Results  Component Value Date   ETH <10 05/23/2022   ETH 313 (HH) 05/18/2022    Metabolic Disorder Labs:  Lab Results  Component Value Date   HGBA1C 5.1 05/23/2022   MPG 99.67 05/23/2022   MPG 85.32 02/14/2022   No results found for: PROLACTIN Lab Results  Component Value Date   CHOL 167 05/23/2022   TRIG 98 05/23/2022   HDL 52 05/23/2022   CHOLHDL 3.2 05/23/2022   VLDL 20 05/23/2022   LDLCALC 95 05/23/2022   LDLCALC 58 02/14/2022    Current Medications: Current Facility-Administered Medications  Medication Dose Route Frequency Provider Last Rate Last Admin   acetaminophen (TYLENOL) tablet 650 mg  650 mg Oral Q6H PRN Maryagnes Amos, FNP       alum & mag hydroxide-simeth (MAALOX/MYLANTA) 200-200-20 MG/5ML suspension 30 mL  30 mL Oral Q6H PRN Starkes-Perry, Juel Burrow, FNP       feeding supplement (ENSURE ENLIVE / ENSURE PLUS) liquid 237 mL  237 mL Oral BID BM Lavona Norsworthy, MD   237 mL at 05/23/22 1126   folic acid (FOLVITE) tablet 1 mg  1 mg Oral Daily Maryagnes Amos, FNP   1 mg at 05/23/22  0746   hydrOXYzine (ATARAX) tablet 25 mg  25 mg Oral Q6H PRN Cecilie Lowers, FNP   25 mg at 05/22/22 2117   lip balm (CARMEX) ointment   Topical PRN Maryagnes Amos, FNP       loperamide (IMODIUM) capsule 2-4 mg  2-4 mg Oral PRN Ntuen, Jesusita Oka, FNP       LORazepam (ATIVAN) tablet 1 mg  1 mg Oral Q6H PRN Ntuen, Tina C, FNP       LORazepam (ATIVAN) tablet 1 mg  1 mg Oral QID Ntuen, Jesusita Oka, FNP  1 mg at 05/23/22 1127   Followed by   Melene Muller ON 05/24/2022] LORazepam (ATIVAN) tablet 1 mg  1 mg Oral TID Ntuen, Jesusita Oka, FNP       Followed by   Melene Muller ON 05/25/2022] LORazepam (ATIVAN) tablet 1 mg  1 mg Oral BID Ntuen, Jesusita Oka, FNP       Followed by   Melene Muller ON 05/26/2022] LORazepam (ATIVAN) tablet 1 mg  1 mg Oral Daily Ntuen, Jesusita Oka, FNP       magnesium hydroxide (MILK OF MAGNESIA) suspension 30 mL  30 mL Oral Daily PRN Starkes-Perry, Juel Burrow, FNP       multivitamin with minerals tablet 1 tablet  1 tablet Oral Daily Ntuen, Jesusita Oka, FNP   1 tablet at 05/23/22 0746   ondansetron (ZOFRAN-ODT) disintegrating tablet 4 mg  4 mg Oral Q6H PRN Ntuen, Jesusita Oka, FNP       pantoprazole (PROTONIX) EC tablet 40 mg  40 mg Oral Daily Maryagnes Amos, FNP   40 mg at 05/23/22 0746   propranolol (INDERAL) tablet 10 mg  10 mg Oral Q12H Elani Delph, MD       thiamine (B-1) injection 100 mg  100 mg Intramuscular Once Ntuen, Jesusita Oka, FNP       thiamine tablet 100 mg  100 mg Oral Daily Ntuen, Tina C, FNP   100 mg at 05/23/22 0746   PTA Medications: Medications Prior to Admission  Medication Sig Dispense Refill Last Dose   escitalopram (LEXAPRO) 10 MG tablet Take 1 tablet (10 mg total) by mouth daily. 30 tablet 0    hydrOXYzine (ATARAX) 25 MG tablet Take 1 tablet (25 mg total) by mouth every 6 (six) hours as needed for anxiety. (Patient not taking: Reported on 03/31/2022) 15 tablet 0    pantoprazole (PROTONIX) 40 MG tablet Take 1 tablet (40 mg total) by mouth daily. 30 tablet 1    thiamine 100 MG tablet Take 1  tablet (100 mg total) by mouth daily.      traZODone (DESYREL) 50 MG tablet Take 1 tablet (50 mg total) by mouth at bedtime as needed for sleep. 30 tablet 0     Musculoskeletal: Strength & Muscle Tone: within normal limits Gait & Station: normal Patient leans: N/A       Psychiatric Specialty Exam:  Presentation  General Appearance: Appropriate for Environment; Casual; Fairly Groomed  Eye Contact:Fair  Speech:Clear and Coherent; Normal Rate  Speech Volume:Normal  Handedness:Right   Mood and Affect  Mood:Anxious; Depressed; Hopeless; Irritable  Affect:Appropriate; Congruent; Depressed   Thought Process  Thought Processes:Coherent; Linear  Duration of Psychotic Symptoms: No data recorded Past Diagnosis of Schizophrenia or Psychoactive disorder: No  Descriptions of Associations:Intact  Orientation:Full (Time, Place and Person)  Thought Content:Logical; WDL  Hallucinations:Hallucinations: None  Ideas of Reference:None  Suicidal Thoughts:Suicidal Thoughts: No  Homicidal Thoughts:Homicidal Thoughts: No   Sensorium  Memory:Immediate Fair; Recent Fair; Remote Fair  Judgment:poor  Insight:Fair   Executive Functions  Concentration:Fair  Attention Span:Fair  Recall:Fair  Fund of Knowledge:Fair  Language:Good   Psychomotor Activity  Psychomotor Activity:Psychomotor Activity: Tremor   Assets  Assets:Communication Skills; Physical Health   Sleep  Sleep:Sleep: Fair Number of Hours of Sleep: 4    Physical Exam: Physical Exam Vitals and nursing note reviewed.  Constitutional:      Appearance: Normal appearance.  HENT:     Head: Normocephalic and atraumatic.     Right Ear: External ear normal.     Left  Ear: External ear normal.     Nose: Nose normal.     Mouth/Throat:     Mouth: Mucous membranes are moist.     Pharynx: Oropharynx is clear.  Eyes:     Extraocular Movements: Extraocular movements intact.     Conjunctiva/sclera:  Conjunctivae normal.     Pupils: Pupils are equal, round, and reactive to light.  Cardiovascular:     Rate and Rhythm: Tachycardia present.     Comments: P 102, Nursing staff to recheck  Pulmonary:     Effort: Pulmonary effort is normal.  Abdominal:     Palpations: Abdomen is soft.  Genitourinary:    Comments: deferred Musculoskeletal:        General: Normal range of motion.     Cervical back: Normal range of motion and neck supple.  Skin:    General: Skin is warm.  Neurological:     General: No focal deficit present.     Mental Status: He is alert and oriented to person, place, and time.  Psychiatric:        Behavior: Behavior normal.   Review of Systems  Constitutional: Negative.  Negative for chills and fever.  HENT: Negative.  Negative for hearing loss and tinnitus.   Eyes: Negative.  Negative for blurred vision and double vision.  Respiratory: Negative.  Negative for cough, sputum production, shortness of breath and wheezing.   Cardiovascular: Negative.  Negative for chest pain and palpitations.  Gastrointestinal: Negative.  Negative for abdominal pain, constipation, diarrhea, heartburn, nausea and vomiting.  Genitourinary: Negative.  Negative for dysuria, frequency and urgency.  Musculoskeletal: Negative.  Negative for back pain, falls, joint pain, myalgias and neck pain.  Skin: Negative.  Negative for itching and rash.       Dry and flaky to face an wrist areas.  Neurological:  Positive for tremors (W/D from alcohol). Negative for dizziness, tingling, sensory change, speech change, focal weakness, seizures, loss of consciousness, weakness and headaches.  Endo/Heme/Allergies: Negative.  Negative for environmental allergies and polydipsia. Does not bruise/bleed easily.  Psychiatric/Behavioral:  Positive for depression and suicidal ideas. The patient is nervous/anxious.    Blood pressure 130/89, pulse (!) 102, temperature (!) 97.5 F (36.4 C), temperature source Oral, resp.  rate 20, height 6' (1.829 m), weight 86.9 kg, SpO2 99 %. Body mass index is 25.99 kg/m.  Treatment Plan Summary: Daily contact with patient to assess and evaluate symptoms and progress in treatment and Medication management  Treatment Plan Summary: Daily contact with patient to assess and evaluate symptoms and progress in treatment and Medication management   Observation Level/Precautions:  15 minute checks  Laboratory:  Labs reviewed   Psychotherapy:  Unit Group sessions  Medications:  See [facility]  Consultations:  To be determined   Discharge Concerns:  Safety, medication compliance, mood stability  Estimated LOS: 5-7 days  Other:  N/A    Physician Treatment Plan for Primary Diagnosis:  MDD (major depressive disorder), recurrent episode, severe (HCC) Principal Problem:  Active Problems:   Alcohol use disorder, severe, dependence (HCC)   PLAN Safety and Monitoring: Involuntary admission to inpatient psychiatric unit for safety, stabilization and treatment Daily contact with patient to assess and evaluate symptoms and progress in treatment Patient's case to be discussed in multi-disciplinary team meeting Observation Level : q15 minute checks Vital signs: q12 hours Precautions: suicide, seizures     Psychiatric Medications:  -Start lexapro 5 mg once daily on 5-25, then increase to 10 mg once daily thereafter  Other Medications -Initiate Ativan Detox Protocol for alcohol W/D symptoms -Initiate MVI for supplement -Initiate Thiamine tablet 100 mg po daily for supplement -Initiate Folic Acid 1 mg po daily for supplement -Initiate Pantoprazole EC tablet 40 mg po daily -Initiate Propranolol 10 mg po Q 12 hours for anxiety and BP -Initiate Lip balm ointment for dry lips. -Initiate Hydroxyzine 25 mg every 6 hours PRN -Initiate Nicotine Replacement Protocol     Other PRNS -Continue Tylenol 650 mg every 6 hours PRN for mild pain -Continue Maalox 30 mg every 4 hrs PRN for  indigestion -Continue Imodium 2-4 mg as needed for diarrhea -Continue Milk of Magnesia as needed every 6 hrs for constipation -Continue Zofran disintegrating tabs every 6 hrs PRN for nausea    Long Term Goal(s): Improvement in symptoms so as ready for discharge   Short Term Goals: Ability to identify changes in lifestyle to reduce recurrence of condition will improve, Ability to verbalize feelings will improve, Ability to disclose and discuss suicidal ideas, Ability to demonstrate self-control will improve, Ability to identify and develop effective coping behaviors will improve, Ability to maintain clinical measurements within normal limits will improve, Compliance with prescribed medications will improve, and Ability to identify triggers associated with substance abuse/mental health issues will improve   Physician Treatment Plan for Secondary Diagnosis:    Discharge Planning: Social work and case management to assist with discharge planning and identification of hospital follow-up needs prior to discharge Estimated LOS: 5-7 days Discharge Concerns: Need to establish a safety plan; Medication compliance and effectiveness Discharge Goals: Return home with outpatient referrals for mental health follow-up including medication management/psychotherapy   I certify that inpatient services furnished can reasonably be expected to improve the patient's condition.      Physician Treatment Plan for Primary Diagnosis: MDD (major depressive disorder), recurrent episode, severe (HCC)    Cecilie Lowers, FNP 5/24/20232:59 PM  Total Time Spent in Direct Patient Care:  I personally spent 60 minutes on the unit in direct patient care. The direct patient care time included face-to-face time with the patient, reviewing the patient's chart, communicating with other professionals, and coordinating care. Greater than 50% of this time was spent in counseling or coordinating care with the patient regarding goals of  hospitalization, psycho-education, and discharge planning needs.  I have independently evaluated the patient during a face-to-face assessment on 05/23/22. I reviewed the patient's chart, and I participated in key portions of the service. I discussed the case with the APP, and I agree with the assessment and plan of care as documented in the APP's note , as addended by me or notated below:  I directly edited the H&P, as above.   Phineas Inches, MD Psychiatrist

## 2022-05-23 NOTE — [facility] Suicide Risk Assessment (Signed)
[facility] INPATIENT:  Family/Significant Other Suicide Prevention Education  Suicide Prevention Education:  Education Completed; Any Mechele Collin, partner, [phone] has been identified by the patient as the family member/significant other with whom the patient will be residing, and identified as the person(s) who will aid the patient in the event of a mental health crisis (suicidal ideations/suicide attempt).  With written consent from the patient, the family member/significant other has been provided the following suicide prevention education, prior to the and/or following the discharge of the patient.  States she is concerned for his safety and wellbeing. Reports patient is no longer tending to his health and hygiene anymore. Patient is passing out in awkward positions cutting off circulation to extremities and "shitting himself." Reports the patient is no longer able to return to his home due to damages and general safety concerned. Denies knowledge of access to firearms, states she does not know what medications the patient's father is on which he could have access to should he return home.   Plan for direct transfer to inpatient substance use treatment.   The suicide prevention education provided includes the following: Suicide risk factors Suicide prevention and interventions National Suicide Hotline telephone number [facility] assessment telephone number [facility] Emergency Assistance 911 [facility] and/or Residential Mobile Crisis Unit telephone number  Request made of family/significant other to: Remove weapons (e.g., guns, rifles, knives), all items previously/currently identified as safety concern.   Remove drugs/medications (over-the-counter, prescriptions, illicit drugs), all items previously/currently identified as a safety concern.  The family member/significant other verbalizes understanding of the suicide prevention education information provided.  The family  member/significant other agrees to remove the items of safety concern listed above.  Corky Crafts 05/23/2022, 3:52 PM

## 2022-05-23 NOTE — [facility] Group Notes (Signed)
PT attended NA group and was appropriate and attentive. 

## 2022-05-24 DIAGNOSIS — F332 Major depressive disorder, recurrent severe without psychotic features: Secondary | ICD-10-CM | POA: Diagnosis not present

## 2022-05-24 LAB — COMPREHENSIVE METABOLIC PANEL
ALT: 23 U/L (ref 0–44)
AST: 24 U/L (ref 15–41)
Albumin: 3.9 g/dL (ref 3.5–5.0)
Alkaline Phosphatase: 31 U/L — ABNORMAL LOW (ref 38–126)
Anion gap: 6 (ref 5–15)
BUN: 20 mg/dL (ref 6–20)
CO2: 25 mmol/L (ref 22–32)
Calcium: 8.7 mg/dL — ABNORMAL LOW (ref 8.9–10.3)
Chloride: 106 mmol/L (ref 98–111)
Creatinine, Ser: 1.11 mg/dL (ref 0.61–1.24)
GFR, Estimated: >60 mL/min (ref >60)
Glucose, Bld: 114 mg/dL — ABNORMAL HIGH (ref 70–99)
Potassium: 4.2 mmol/L (ref 3.5–5.1)
Sodium: 137 mmol/L (ref 135–145)
Total Bilirubin: 0.5 mg/dL (ref 0.3–1.2)
Total Protein: 6.5 g/dL (ref 6.5–8.1)

## 2022-05-24 MED ORDER — MENTHOL 3 MG MT LOZG
1.0000 | LOZENGE | OROMUCOSAL | Status: DC | PRN
  Administered 2022-05-24: 3 mg via ORAL
  Filled 2022-05-24 (×2): qty 9

## 2022-05-24 MED ORDER — TRAZODONE HCL 50 MG PO TABS
50.0000 mg | ORAL_TABLET | Freq: Every evening | ORAL | Status: DC | PRN
  Administered 2022-05-24: 50 mg via ORAL
  Filled 2022-05-24: qty 1

## 2022-05-24 MED ORDER — LORAZEPAM 0.5 MG PO TABS
0.5000 mg | ORAL_TABLET | Freq: Two times a day (BID) | ORAL | Status: AC
  Administered 2022-05-24 – 2022-05-25 (×2): 0.5 mg via ORAL
  Filled 2022-05-24 (×2): qty 1

## 2022-05-24 NOTE — Progress Notes (Signed)
[facility] Group Notes:  (Nursing/MHT/Case Management/Adjunct)  Date:  05/24/2022  Time: 2015  Type of Therapy:   wrap up group  Participation Level:  Active  Participation Quality:  Appropriate, Attentive, Sharing, and Supportive  Affect:  Appropriate  Cognitive:  Alert  Insight:  Improving  Engagement in Group:  Engaged  Modes of Intervention:  Clarification, Education, and Support  Summary of Progress/Problems: Positive thinking and positive change were discussed.   Keith Miller 05/24/2022, 9:24 PM

## 2022-05-24 NOTE — Progress Notes (Signed)
[facility]/BMU LCSW Progress Note   05/24/2022    9:16 AM  NIKI COSMAN   [id number]   Type of Contact and Topic: Care Coordination   CSW sent referral to [facility] for inpatient substance use treatment. Patient agreeable with this plan. Situation ongoing, CSW will continue to monitor and update note as more information becomes available.     Signed:  Corky Crafts, MSW, LCSWA, LCAS 05/24/2022 9:16 AM

## 2022-05-24 NOTE — Progress Notes (Addendum)
Pt denies SI/HI/AVH and verbally agrees to approach staff if these become apparent or before harming themselves/others. Rates depression 2/10. Rates anxiety 2/10. Rates pain 0/10. Pt feels like he is improving. Pt has been interacting with others well. Pt likes to be on time with his medications. Pt has had no issues. [facility]. The call went well and Clinical research associate spoke to Interior and spatial designer. He feels like he is good for an assessment and will keep a slot open for him tomorrow at 11am. SW notified. Scheduled medications administered to pt, per MD orders. RN provided support and encouragement to pt. Q15 min safety checks implemented and continued. Pt safe on the unit. RN will continue to monitor and intervene as needed.   05/24/22 0748  Psych Admission Type (Psych Patients Only)  Admission Status Involuntary  Psychosocial Assessment  Patient Complaints Anxiety;Depression  Eye Contact Fair  Facial Expression Anxious  Affect Anxious;Depressed  Speech Logical/coherent  Interaction Assertive  Motor Activity Tremors  Appearance/Hygiene Unremarkable  Behavior Characteristics Cooperative;Anxious  Mood Depressed;Anxious;Pleasant  Thought Process  Coherency WDL  Content WDL  Delusions None reported or observed  Perception WDL  Hallucination None reported or observed  Judgment Limited  Confusion None  Danger to Self  Current suicidal ideation? Denies  Danger to Others  Danger to Others None reported or observed

## 2022-05-24 NOTE — Progress Notes (Signed)
   05/24/22 0000  Psych Admission Type (Psych Patients Only)  Admission Status Involuntary  Psychosocial Assessment  Patient Complaints Anxiety  Eye Contact Fair  Facial Expression Anxious  Affect Depressed  Speech Logical/coherent  Interaction Assertive  Motor Activity Fidgety;Tremors  Appearance/Hygiene Improved  Behavior Characteristics Cooperative;Appropriate to situation  Mood Pleasant  Thought Process  Coherency WDL  Content WDL  Delusions None reported or observed  Perception WDL  Hallucination None reported or observed  Judgment Poor  Confusion None  Danger to Self  Current suicidal ideation? Denies  Danger to Others  Danger to Others None reported or observed

## 2022-05-24 NOTE — Progress Notes (Signed)
[facility] MD Progress Note  05/24/2022 10:32 AM SHERVIN CYPERT  MRN:  [id number]  Subjective:    Keith Miller is a 51 year old male who presented to [facility] from [facility] with alcohol intoxication and withdrawal symptoms.   Patient has history of multiple ED visits and [facility] admissions for alcohol use disorder, intoxication and dependence.  Patient has psychiatric history of MDD, (major depressive d/o), recurrent episode, severe, Alcohol use d/o, severe, dependence, Alcohol w/d syndrome with complications, Alcohol W/D, SI, Anxiety D/O.  The psychiatric team made the following recommendations prior: -Start Ativan Detox Protocol for alcohol W/D symptoms -Start lexapro 5 mg once daily and increase to 10 mg once daily on 05-24-22.   On assessment today, he reports that he is feeling better and "good," but that he is still anxious. He reports that his appetite has been better than ever. He woke up at 1:30 this morning thinking it was closer to 6 and went down the hall to get coffee, but he was able to get a little more sleep after he was told the time. He denies any physical symptoms of withdrawal and he is still agreeable with the plan going forward of inpatient substance use treatment after dc from this hospital.  He otherwise denies having any SI, HI, or psychotic symptoms. He is tolerating medication, fluids and good. Tremor is less today.  After discussion with the attending, team, and chart review, this is similar to what has been seen in the past. He is an unreliable historian where his history has to be confirmed with his sister and girlfriend. Collateral source informed us of suicidal ideation last week and an inability to take care of himself due to his drinking. He is still on his ativan.    Principal Problem: MDD (major depressive disorder), recurrent episode, severe (HCC) Diagnosis: Principal Problem:   MDD (major depressive disorder), recurrent episode, severe (HCC) Active Problems:   Alcohol  use disorder, severe, dependence (HCC)  Past Psychiatric History:  Inpatient at [facility] 01/2022 for MDD and Etoh Use disorder Previous psych meds: Lexapro 20mg  , Naltrexone 50mg , Trazodone 50mg    Past Medical History:  Past Medical History:  Diagnosis Date   ETOH abuse     Past Surgical History:  Procedure Laterality Date   ANKLE SURGERY     KNEE SURGERY     Family History: History reviewed. No pertinent family history. Family Psychiatric  History: Mother had severe anxiety and depression. Social History:  Social History   Substance and Sexual Activity  Alcohol Use Yes   Comment: 3-4 half pints of vodka QD     Social History   Substance and Sexual Activity  Drug Use Not Currently    Social History   Socioeconomic History   Marital status: Significant Other    Spouse name: Not on file   Number of children: Not on file   Years of education: Not on file   Highest education level: Not on file  Occupational History   Not on file  Tobacco Use   Smoking status: Never   Smokeless tobacco: Never  Vaping Use   Vaping Use: Never used  Substance and Sexual Activity   Alcohol use: Yes    Comment: 3-4 half pints of vodka QD   Drug use: Not Currently   Sexual activity: Yes    Birth control/protection: None  Other Topics Concern   Not on file  Social History Narrative   Not on file   Social Determinants of  Health   Financial Resource Strain: Not on file  Food Insecurity: Not on file  Transportation Needs: Not on file  Physical Activity: Not on file  Stress: Not on file  Social Connections: Not on file   Additional Social History:                         Sleep: Good  Appetite:  Good  Current Medications: Current Facility-Administered Medications  Medication Dose Route Frequency Provider Last Rate Last Admin   acetaminophen (TYLENOL) tablet 650 mg  650 mg Oral Q6H PRN Starkes-Perry, Juel Burrow, FNP       alum & mag hydroxide-simeth (MAALOX/MYLANTA)  200-200-20 MG/5ML suspension 30 mL  30 mL Oral Q6H PRN Starkes-Perry, Juel Burrow, FNP       [START ON 05/25/2022] escitalopram (LEXAPRO) tablet 10 mg  10 mg Oral Daily Aarnav Steagall, MD       feeding supplement (ENSURE ENLIVE / ENSURE PLUS) liquid 237 mL  237 mL Oral BID BM Fatin Bachicha, Harrold Donath, MD   237 mL at 05/24/22 1610   folic acid (FOLVITE) tablet 1 mg  1 mg Oral Daily Maryagnes Amos, FNP   1 mg at 05/24/22 9604   hydrOXYzine (ATARAX) tablet 25 mg  25 mg Oral Q6H PRN Cecilie Lowers, FNP   25 mg at 05/23/22 2136   lip balm (CARMEX) ointment   Topical PRN Maryagnes Amos, FNP       loperamide (IMODIUM) capsule 2-4 mg  2-4 mg Oral PRN Ntuen, Jesusita Oka, FNP       LORazepam (ATIVAN) tablet 1 mg  1 mg Oral Q6H PRN Ntuen, Jesusita Oka, FNP       LORazepam (ATIVAN) tablet 1 mg  1 mg Oral TID Cecilie Lowers, FNP   1 mg at 05/24/22 0747   Followed by   Melene Muller ON 05/25/2022] LORazepam (ATIVAN) tablet 1 mg  1 mg Oral BID Ntuen, Jesusita Oka, FNP       Followed by   Melene Muller ON 05/26/2022] LORazepam (ATIVAN) tablet 1 mg  1 mg Oral Daily Ntuen, Tina C, FNP       magnesium hydroxide (MILK OF MAGNESIA) suspension 30 mL  30 mL Oral Daily PRN Starkes-Perry, Juel Burrow, FNP       menthol-cetylpyridinium (CEPACOL) lozenge 3 mg  1 lozenge Oral PRN Domnick Chervenak, Harrold Donath, MD   3 mg at 05/24/22 5409   multivitamin with minerals tablet 1 tablet  1 tablet Oral Daily Ntuen, Jesusita Oka, FNP   1 tablet at 05/24/22 0747   ondansetron (ZOFRAN-ODT) disintegrating tablet 4 mg  4 mg Oral Q6H PRN Ntuen, Jesusita Oka, FNP       pantoprazole (PROTONIX) EC tablet 40 mg  40 mg Oral Daily Maryagnes Amos, FNP   40 mg at 05/24/22 0747   propranolol (INDERAL) tablet 10 mg  10 mg Oral Q12H Lerae Langham, Harrold Donath, MD   10 mg at 05/24/22 0747   thiamine (B-1) injection 100 mg  100 mg Intramuscular Once Ntuen, Tina C, FNP       thiamine tablet 100 mg  100 mg Oral Daily Ntuen, Tina C, FNP   100 mg at 05/24/22 8119    Lab Results:  Results for orders  placed or performed during the hospital encounter of 05/22/22 (from the past 48 hour(s))  Urinalysis, Complete w Microscopic Urine, Clean Catch     Status: None   Collection Time: 05/22/22  6:13 PM  Result Value  Ref Range   Color, Urine YELLOW YELLOW   APPearance CLEAR CLEAR   Specific Gravity, Urine 1.024 1.005 - 1.030   pH 6.0 5.0 - 8.0   Glucose, UA NEGATIVE NEGATIVE mg/dL   Hgb urine dipstick NEGATIVE NEGATIVE   Bilirubin Urine NEGATIVE NEGATIVE   Ketones, ur NEGATIVE NEGATIVE mg/dL   Protein, ur NEGATIVE NEGATIVE mg/dL   Nitrite NEGATIVE NEGATIVE   Leukocytes,Ua NEGATIVE NEGATIVE   WBC, UA 0-5 0 - 5 WBC/hpf   Bacteria, UA NONE SEEN NONE SEEN   Squamous Epithelial / LPF 0-5 0 - 5   Mucus PRESENT     Comment: Performed at [facility], [address] [place], [place] [address]  CBC     Status: None   Collection Time: 05/23/22  6:20 AM  Result Value Ref Range   WBC 4.6 4.0 - 10.5 K/uL   RBC 4.61 4.22 - 5.81 MIL/uL   Hemoglobin 15.3 13.0 - 17.0 g/dL   HCT 60.442.6 54.039.0 - 98.152.0 %   MCV 92.4 80.0 - 100.0 fL   MCH 33.2 26.0 - 34.0 pg   MCHC 35.9 30.0 - 36.0 g/dL   RDW 19.113.8 47.811.5 - 29.515.5 %   Platelets 220 150 - 400 K/uL   nRBC 0.0 0.0 - 0.2 %    Comment: Performed at [facility], [address] [place], [place] [address]  Comprehensive metabolic panel     Status: Abnormal   Collection Time: 05/23/22  6:20 AM  Result Value Ref Range   Sodium 137 135 - 145 mmol/L   Potassium 3.7 3.5 - 5.1 mmol/L   Chloride 109 98 - 111 mmol/L   CO2 20 (L) 22 - 32 mmol/L   Glucose, Bld 96 70 - 99 mg/dL    Comment: Glucose reference range applies only to samples taken after fasting for at least 8 hours.   BUN 17 6 - 20 mg/dL   Creatinine, Ser 8.650.97 0.61 - 1.24 mg/dL   Calcium 8.9 8.9 - 78.410.3 mg/dL   Total Protein 7.2 6.5 - 8.1 g/dL   Albumin 3.9 3.5 - 5.0 g/dL   AST 25 15 - 41 U/L   ALT 25 0 - 44 U/L   Alkaline Phosphatase 31 (L) 38 - 126 U/L   Total  Bilirubin 0.8 0.3 - 1.2 mg/dL   GFR, Estimated >69>60 >62>60 mL/min    Comment: (NOTE) Calculated using the CKD-EPI Creatinine Equation (2021)    Anion gap 8 5 - 15    Comment: Performed at [facility], [address] [place], [place] [address]  Hemoglobin A1c     Status: None   Collection Time: 05/23/22  6:20 AM  Result Value Ref Range   Hgb A1c MFr Bld 5.1 4.8 - 5.6 %    Comment: (NOTE) Pre diabetes:          5.7%-6.4%  Diabetes:              >6.4%  Glycemic control for   <7.0% adults with diabetes    Mean Plasma Glucose 99.67 mg/dL    Comment: Performed at [facility], [address] [place], [place] [address]  Magnesium     Status: None   Collection Time: 05/23/22  6:20 AM  Result Value Ref Range   Magnesium 2.0 1.7 - 2.4 mg/dL    Comment: Performed at [facility], [address] [place], [place] [address]  Ethanol  Status: None   Collection Time: 05/23/22  6:20 AM  Result Value Ref Range   Alcohol, Ethyl (B) <10 <10 mg/dL    Comment: (NOTE) Lowest detectable limit for serum alcohol is 10 mg/dL.  For medical purposes only. Performed at [facility], [address], [place], [place] [address]   Lipid panel     Status: None   Collection Time: 05/23/22  6:20 AM  Result Value Ref Range   Cholesterol 167 0 - 200 mg/dL   Triglycerides 98 <696 mg/dL   HDL 52 >29 mg/dL   Total CHOL/HDL Ratio 3.2 RATIO   VLDL 20 0 - 40 mg/dL   LDL Cholesterol 95 0 - 99 mg/dL    Comment:        Total Cholesterol/HDL:CHD Risk Coronary Heart Disease Risk Table                     Men   Women  1/2 Average Risk   3.4   3.3  Average Risk       5.0   4.4  2 X Average Risk   9.6   7.1  3 X Average Risk  23.4   11.0        Use the calculated Patient Ratio above and the CHD Risk Table to determine the patient's CHD Risk.        ATP III CLASSIFICATION (LDL):  <100     mg/dL   Optimal  528-413  mg/dL   Near or Above                     Optimal  130-159  mg/dL   Borderline  244-010  mg/dL   High  >272     mg/dL   Very High Performed at [facility], [address], [place], [place] [address]   TSH     Status: None   Collection Time: 05/23/22  6:20 AM  Result Value Ref Range   TSH 1.452 0.350 - 4.500 uIU/mL    Comment: Performed by a 3rd Generation assay with a functional sensitivity of <=0.01 uIU/mL. Performed at [facility], [address], [place], [place] [address]     Blood Alcohol level:  Lab Results  Component Value Date   ETH <10 05/23/2022   ETH 313 (HH) 05/18/2022    Metabolic Disorder Labs: Lab Results  Component Value Date   HGBA1C 5.1 05/23/2022   MPG 99.67 05/23/2022   MPG 85.32 02/14/2022   No results found for: PROLACTIN Lab Results  Component Value Date   CHOL 167 05/23/2022   TRIG 98 05/23/2022   HDL 52 05/23/2022   CHOLHDL 3.2 05/23/2022   VLDL 20 05/23/2022   LDLCALC 95 05/23/2022   LDLCALC 58 02/14/2022    Physical Findings: AIMS:  , ,  ,  ,    CIWA:  CIWA-Ar Total: 4 COWS:     Musculoskeletal: Strength & Muscle Tone: within normal limits Gait & Station: normal Patient leans: N/A  Psychiatric Specialty Exam:  Presentation  General Appearance: Appropriate for Environment; Casual; Well Groomed  Eye Contact:Good  Speech:Clear and Coherent; Normal Rate  Speech Volume:Normal  Handedness:Right   Mood and Affect  Mood:Anxious  Affect:Congruent   Thought Process  Thought Processes:Goal Directed  Descriptions of Associations:Intact  Orientation:Full (Time, Place and Person)  Thought Content:Logical; WDL  History of Schizophrenia/Schizoaffective disorder:No  Duration of Psychotic Symptoms:No data recorded Hallucinations:Hallucinations: None  Ideas of  Reference:None  Suicidal Thoughts:Suicidal Thoughts: No  Homicidal Thoughts:Homicidal Thoughts: No   Sensorium  Memory:Immediate Fair; Recent Fair;  Remote Fair  Judgment:improving  Insight:Fair   Executive Functions  Concentration:Fair  Attention Span:Fair  Recall:Fair  Fund of Knowledge:Fair  Language:Good   Psychomotor Activity  Psychomotor Activity:Psychomotor Activity: Normal   Assets  Assets:Communication Skills; Physical Health; Social Support   Sleep  Sleep:Sleep: Fair Number of Hours of Sleep: 4    Physical Exam: Physical Exam Vitals and nursing note reviewed.  Constitutional:      General: He is not in acute distress.    Appearance: Normal appearance. He is normal weight. He is not ill-appearing or toxic-appearing.  HENT:     Head: Normocephalic.  Pulmonary:     Effort: Pulmonary effort is normal.  Neurological:     Mental Status: He is alert and oriented to person, place, and time.  Psychiatric:        Mood and Affect: Mood normal.   Review of Systems  Constitutional:  Negative for chills, fever and malaise/fatigue.  Respiratory:  Negative for cough.   Cardiovascular:  Negative for chest pain.  Gastrointestinal:  Negative for abdominal pain, nausea and vomiting.  Psychiatric/Behavioral:  Positive for substance abuse. Negative for hallucinations, memory loss and suicidal ideas. The patient is nervous/anxious. The patient does not have insomnia.    Blood pressure 114/87, pulse (!) 111, temperature 97.7 F (36.5 C), resp. rate 17, height 6' (1.829 m), weight 86.9 kg, SpO2 99 %. Body mass index is 25.99 kg/m.   Treatment Plan Summary: Daily contact with patient to assess and evaluate symptoms and progress in treatment    ASSESSMENT:  Inocencio H. Purnell is a 51 year old male who presented to [facility] from [facility] for alcohol intoxication and withdrawal symptoms with a PMH of multiple ED visits and [facility] admissions for alcohol use disorder, intoxication and dependence.   On assessment today, he is still anxious and agreeable for inpatient substance abuse treatment.  Diagnoses / Active Problems: -  MDD, recurrent episode, severe - Alcohol use disorder, severe, dependence - Anxiety Disorder NOS   PLAN: Safety and Monitoring:  -- Involuntary admission to inpatient psychiatric unit for safety, stabilization and treatment  -- Daily contact with patient to assess and evaluate symptoms and progress in treatment  -- Patient's case to be discussed in multi-disciplinary team meeting  -- Observation Level : q15 minute checks  -- Vital signs:  q12 hours  -- Precautions: suicide, elopement, and assault  2. Psychiatric Diagnoses and Treatment:   -Decrease ativan taper to 0.5 mg bid for 2 doses, then stop. Continue ativan PRN for CIWA <10  -Continue lexapro 10mg  daily -Continue Propranolol 10 mg po Q 12 hours for anxiety and BP -Continue MVI for supplement -Continue Thiamine tablet 100 mg po daily for supplement -Continue Folic Acid 1 mg po daily for supplement -Continue Hydroxyzine 25 mg every 6 hours PRN   -- The risks/benefits/side-effects/alternatives to this medication were discussed in detail with the patient and time was given for questions. The patient consents to medication trial.   -- Metabolic profile and EKG monitoring obtained while on an atypical antipsychotic (BMI: Lipid Panel: HbgA1c: QTc:)   -- Encouraged patient to participate in unit milieu and in scheduled group therapies   -- Short Term Goals: Ability to identify changes in lifestyle to reduce recurrence of condition will improve, Ability to verbalize feelings will improve, Ability to disclose and discuss suicidal ideas, Ability to demonstrate self-control will improve,  Ability to identify and develop effective coping behaviors will improve, Ability to maintain clinical measurements within normal limits will improve, Compliance with prescribed medications will improve, and Ability to identify triggers associated with substance abuse/mental health issues will improve  -- Long Term Goals: Improvement in symptoms so as ready for  discharge    3. Medical Issues Being Addressed:   Tobacco Use Disorder  -- Nicotine patch /24 hours ordered  -- Smoking cessation encouraged  4. Discharge Planning:   -- Social work and case management to assist with discharge planning and identification of hospital follow-up needs prior to discharge  -- Estimated LOS: 5-7 days  -- Discharge Concerns: Need to establish a safety plan; Medication compliance and effectiveness  -- Discharge Goals: Return home with outpatient referrals for mental health follow-up including medication management/psychotherapy   Louanne Skye, Medical Student 05/24/2022, 10:32 AM  Total Time Spent in Direct Patient Care:  I personally spent 25 minutes on the unit in direct patient care. The direct patient care time included face-to-face time with the patient, reviewing the patient's chart, communicating with other professionals, and coordinating care. Greater than 50% of this time was spent in counseling or coordinating care with the patient regarding goals of hospitalization, psycho-education, and discharge planning needs.  I personally was present and performed or re-performed the history, physical exam and medical decision-making activities of this service and have verified that the service and findings are accurately documented in the student's note, , as addended by me or notated below:  Pt needs door to door transfer to substance use treatment, as after recent hospitalizations, he has not gone to intake of substance use treatment after discharge from the hospital, will cause patient to relapse again and again.  Patient is now agreeable to go to [facility] for substance use treatment, as early as tomorrow Friday 5-26.  Per social work, girlfriend [name] is on board with this.  Repeat CMP, as requested by Dr. Renae Fickle for medical clearance.  Plan for discharge on Friday directly to [facility].  Denies having suicidal thoughts.  Reports alcohol withdrawal  symptoms have ceased.   Phineas Inches, MD Psychiatrist

## 2022-05-24 NOTE — Group Note (Signed)
Date:  05/24/2022 Time:  10:07 AM  Group Topic/Focus:  Orientation:   The focus of this group is to educate the patient on the purpose and policies of crisis stabilization and provide a format to answer questions about their admission.  The group details unit policies and expectations of patients while admitted.    Participation Level:  Active  Participation Quality:  Appropriate  Affect:  Appropriate  Cognitive:  Appropriate  Insight: Appropriate  Engagement in Group:  Engaged  Modes of Intervention:  Discussion  Additional Comments:    Garvin Fila 05/24/2022, 10:07 AM

## 2022-05-25 MED ORDER — PROPRANOLOL HCL 10 MG PO TABS: 10.0000 mg | ORAL_TABLET | Freq: Two times a day (BID) | ORAL | 0 refills | Status: AC

## 2022-05-25 MED ORDER — ESCITALOPRAM OXALATE 10 MG PO TABS: 10.0000 mg | ORAL_TABLET | Freq: Every day | ORAL | 0 refills | Status: AC

## 2022-05-25 MED ORDER — HYDROXYZINE HCL 25 MG PO TABS: 25.0000 mg | ORAL_TABLET | Freq: Four times a day (QID) | ORAL | 0 refills | Status: AC | PRN

## 2022-05-25 MED ORDER — TRAZODONE HCL 50 MG PO TABS: 50.0000 mg | ORAL_TABLET | Freq: Every evening | ORAL | 0 refills | Status: AC | PRN

## 2022-05-25 MED ORDER — PANTOPRAZOLE SODIUM 40 MG PO TBEC: 40.0000 mg | DELAYED_RELEASE_TABLET | Freq: Every day | ORAL | 0 refills | Status: AC

## 2022-05-25 NOTE — Progress Notes (Signed)
   05/25/22 0900  Psych Admission Type (Psych Patients Only)  Admission Status Involuntary  Psychosocial Assessment  Patient Complaints Anxiety  Eye Contact Fair  Facial Expression Anxious  Affect Anxious  Speech Logical/coherent  Interaction Assertive  Motor Activity Pacing  Appearance/Hygiene Unremarkable  Behavior Characteristics Cooperative;Anxious  Mood Anxious;Pleasant  Thought Process  Coherency WDL  Content WDL  Delusions None reported or observed  Perception WDL  Hallucination None reported or observed  Judgment Limited  Confusion None  Danger to Self  Current suicidal ideation? Denies  Agreement Not to Harm Self Yes  Description of Agreement Verbal  Danger to Others  Danger to Others None reported or observed  Danger to Others Abnormal  Harmful Behavior to others No threats or harm toward other people  Destructive Behavior No threats or harm toward property

## 2022-05-25 NOTE — Discharge Summary (Signed)
Physician Discharge Summary Note  Patient:  Keith Miller is an 51 y.o., male MRN:  [id number] DOB:  December 17, 1971 Patient phone:  [phone] (home)  Patient address:   [address] [place] [address],  Total Time spent with patient: 30 minutes  Date of Admission:  05/22/2022 Date of Discharge: 05/25/2022  Reason for Admission:  [name] is a 51 year old male who presented to [facility] from [facility] with alcohol intoxication and withdrawal symptoms. Patient has history of multiple ED visits and [facility] admissions for alcohol use disorder, intoxication and dependence.  Patient has psychiatric history of MDD, (major depressive d/o), recurrent episode, severe, Alcohol use d/o, severe, dependence, Alcohol w/d syndrome with complications, Alcohol W/D, SI, Anxiety D/O.  He is currently separated from girlfriend and moved in to live with his 14 years old father. As per admissions assessment, alcohol intoxication led to pt falling on his elderly father.   Principal Problem: MDD (major depressive disorder), recurrent episode, severe (HCC) Discharge Diagnoses: Principal Problem:   MDD (major depressive disorder), recurrent episode, severe (HCC) Active Problems:   Alcohol use disorder, severe, dependence (HCC)  Past Psychiatric History: As above  Past Medical History:  Past Medical History:  Diagnosis Date   ETOH abuse     Past Surgical History:  Procedure Laterality Date   ANKLE SURGERY     KNEE SURGERY     Family History: History reviewed. No pertinent family history. Family Psychiatric  History: none reported Social History:  Social History   Substance and Sexual Activity  Alcohol Use Yes   Comment: 3-4 half pints of vodka QD     Social History   Substance and Sexual Activity  Drug Use Not Currently    Social History   Socioeconomic History   Marital status: Significant Other    Spouse name: Not on file   Number of children: Not on file   Years of education: Not on file    Highest education level: Not on file  Occupational History   Not on file  Tobacco Use   Smoking status: Never   Smokeless tobacco: Never  Vaping Use   Vaping Use: Never used  Substance and Sexual Activity   Alcohol use: Yes    Comment: 3-4 half pints of vodka QD   Drug use: Not Currently   Sexual activity: Yes    Birth control/protection: None  Other Topics Concern   Not on file  Social History Narrative   Not on file   Social Determinants of Health   Financial Resource Strain: Not on file  Food Insecurity: Not on file  Transportation Needs: Not on file  Physical Activity: Not on file  Stress: Not on file  Social Connections: Not on file                                   HOSPITAL COURSE During the patient's hospitalization, patient had extensive initial psychiatric evaluation, and follow-up psychiatric evaluations every day.  Diagnoses provided & medications started upon initial assessment were as follows: -Started lexapro 5 mg once daily on 5-25, then increase to 10 mg once daily thereafter -Initiated Ativan Detox Protocol for alcohol W/D symptoms -Initiated MVI for supplement -Initiated Thiamine tablet 100 mg po daily for supplement -Initiated Folic Acid 1 mg po daily for supplement -Initiated Pantoprazole EC tablet 40 mg po daily -Initiated Propranolol 10 mg po Q 12 hours for anxiety and  BP -Initiated Lip balm ointment for dry lips. -Initiated Hydroxyzine 25 mg every 6 hours PRN -Initiated Nicotine Replacement Protocol   During the hospitalization, pt completed the Ativan detox protocol for alcohol abuse.  Other adjustments were made to the patient's medication regimen with final medications at discharge being as follows:   -Continue lexapro  daily -Continue Propranolol 10 mg po Q 12 hours for anxiety -Continue Hydroxyzine 25 mg every 6 hours PRN -Continue Inderal 10 mg BID for GERD   Patient's care was discussed during the interdisciplinary team meeting every  day during the hospitalization. The patient denies having side effects to prescribed psychiatric medication. The patient was evaluated each day by a clinical provider to ascertain response to treatment. Improvement was noted by the patient's report of decreasing symptoms, improved sleep and appetite, affect, medication tolerance, behavior, and participation in unit programming.  Patient was asked each day to complete a self inventory noting mood, mental status, pain, new symptoms, anxiety and concerns.     Symptoms were reported as significantly decreased or resolved completely by discharge. On day of discharge, the patient reports that their mood is stable. The patient denied having suicidal thoughts for more than 48 hours prior to discharge.  Patient denies having homicidal thoughts.  Patient denies having auditory hallucinations.  Patient denies any visual hallucinations or other symptoms of psychosis. The patient was motivated to continue taking medication with a goal of continued improvement in mental health.    The patient reports their target psychiatric symptoms of depression, anxiety & alcohol withdrawal symptoms responded well to the psychiatric medications, and the patient reports overall benefit other psychiatric hospitalization. Supportive psychotherapy was provided to the patient. The patient also participated in regular group therapy while hospitalized. Coping skills, problem solving as well as relaxation therapies were also part of the unit programming.   Physical Findings: AIMS: Facial and Oral Movements Muscles of Facial Expression: None, normal Lips and Perioral Area: None, normal Jaw: None, normal Tongue: None, normal,Extremity Movements Upper (arms, wrists, hands, fingers): None, normal Lower (legs, knees, ankles, toes): None, normal, Trunk Movements Neck, shoulders, hips: None, normal, Overall Severity Severity of abnormal movements (highest score from questions above): None,  normal Incapacitation due to abnormal movements: None, normal Patient's awareness of abnormal movements (rate only patient's report): No Awareness, Dental Status Current problems with teeth and/or dentures?: No Does patient usually wear dentures?: No  CIWA:  CIWA-Ar Total: 4 COWS:     Musculoskeletal: Strength & Muscle Tone: within normal limits Gait & Station: normal Patient leans: N/A  Psychiatric Specialty Exam:  Presentation  General Appearance: Appropriate for Environment; Casual; Well Groomed  Eye Contact:Good  Speech:Clear and Coherent; Normal Rate  Speech Volume:Normal  Handedness:Right  Mood and Affect  Mood:Anxious  Affect:Congruent  Thought Process  Thought Processes:Goal Directed  Descriptions of Associations:Intact  Orientation:Full (Time, Place and Person)  Thought Content:Logical; WDL  History of Schizophrenia/Schizoaffective disorder:No  Duration of Psychotic Symptoms:No data recorded Hallucinations:Hallucinations: None  Ideas of Reference:None  Suicidal Thoughts:Suicidal Thoughts: No  Homicidal Thoughts:Homicidal Thoughts: No  Sensorium  Memory:Immediate Fair; Recent Fair; Remote Fair  Judgment:Fair  Insight:Fair  Executive Functions  Concentration:Fair  Attention Span:Fair  Recall:Fair  Fund of Knowledge:Fair  Language:Good  Psychomotor Activity  Psychomotor Activity:Psychomotor Activity: Normal  Assets  Assets:Communication Skills; Physical Health; Social Support  Sleep  Sleep:Sleep: Fair  Physical Exam: Physical Exam Constitutional:      Appearance: Normal appearance.  HENT:     Head: Normocephalic.  Nose: Nose normal. No congestion or rhinorrhea.  Eyes:     Pupils: Pupils are equal, round, and reactive to light.  Pulmonary:     Effort: Pulmonary effort is normal.  Musculoskeletal:        General: Normal range of motion.     Cervical back: Normal range of motion.  Neurological:     Mental Status: He is  alert and oriented to person, place, and time.     Sensory: No sensory deficit.     Coordination: Coordination normal.  Psychiatric:        Behavior: Behavior normal.        Thought Content: Thought content normal.        Judgment: Judgment normal.   Review of Systems  Constitutional: Negative.  Negative for fever.  HENT: Negative.  Negative for sore throat.   Eyes: Negative.   Respiratory: Negative.  Negative for cough.   Cardiovascular: Negative.  Negative for chest pain.  Gastrointestinal: Negative.  Negative for heartburn.  Genitourinary: Negative.   Musculoskeletal: Negative.   Skin: Negative.   Neurological: Negative.   Psychiatric/Behavioral:  Positive for depression (depressive symptoms are resolving on current meds. He denies SI/HI/AVH and verbally contracts for safety outside of this [facility]) and substance abuse. Negative for hallucinations, memory loss and suicidal ideas. Nervous/anxious: pt reports that his anxiety is resolving. Insomnia: insomnia is resolving on current meds.   Blood pressure (!) 124/93, pulse (!) 102, temperature 98.1 F (36.7 C), temperature source Oral, resp. rate 16, height 6' (1.829 m), weight 86.9 kg, SpO2 100 %. Body mass index is 25.99 kg/m.  Social History   Tobacco Use  Smoking Status Never  Smokeless Tobacco Never   Tobacco Cessation:  A prescription for an FDA-approved tobacco cessation medication provided at discharge  Blood Alcohol level:  Lab Results  Component Value Date   ETH <10 05/23/2022   ETH 313 (HH) 05/18/2022   Metabolic Disorder Labs:  Lab Results  Component Value Date   HGBA1C 5.1 05/23/2022   MPG 99.67 05/23/2022   MPG 85.32 02/14/2022   No results found for: PROLACTIN Lab Results  Component Value Date   CHOL 167 05/23/2022   TRIG 98 05/23/2022   HDL 52 05/23/2022   CHOLHDL 3.2 05/23/2022   VLDL 20 05/23/2022   LDLCALC 95 05/23/2022   LDLCALC 58 02/14/2022   See Psychiatric Specialty Exam and Suicide Risk  Assessment completed by Attending Physician prior to discharge.  Discharge destination:  Home  Is patient on multiple antipsychotic therapies at discharge:  No   Has Patient had three or more failed trials of antipsychotic monotherapy by history:  No  Recommended Plan for Multiple Antipsychotic Therapies: NA   Allergies as of 05/25/2022   No Known Allergies      Medication List     STOP taking these medications    thiamine 100 MG tablet       TAKE these medications      Indication  escitalopram 10 MG tablet Commonly known as: LEXAPRO Take 1 tablet (10 mg total) by mouth daily. Start taking on: May 26, 2022  Indication: Major Depressive Disorder   hydrOXYzine 25 MG tablet Commonly known as: ATARAX Take 1 tablet (25 mg total) by mouth every 6 (six) hours as needed for anxiety.  Indication: Feeling Anxious   pantoprazole 40 MG tablet Commonly known as: Protonix Take 1 tablet (40 mg total) by mouth daily.  Indication: Gastroesophageal Reflux Disease   propranolol 10 MG tablet  Commonly known as: INDERAL Take 1 tablet (10 mg total) by mouth every 12 (twelve) hours.  Indication: Feeling Anxious   traZODone 50 MG tablet Commonly known as: DESYREL Take 1 tablet (50 mg total) by mouth at bedtime as needed for sleep.  Indication: Trouble Sleeping        Follow-up [facility] [employer] Go on 05/25/2022.   Why: Please present for scheduled intake appointment on 05/25/2022 at 1100 AM. Contact information: [address] [place] [address] [phone]                Follow-up recommendations:    The patient is able to verbalize their individual safety plan to this provider.   # It is recommended to the patient to continue psychiatric medications as prescribed, after discharge from the hospital.     # It is recommended to the patient to follow up with your outpatient psychiatric provider and PCP.   # It was discussed with the  patient, the impact of alcohol, drugs, tobacco have been there overall psychiatric and medical wellbeing, and total abstinence from substance use was recommended the patient.ed.   # Prescriptions provided or sent directly to preferred pharmacy at discharge. Patient agreeable to plan. Given opportunity to ask questions. Appears to feel comfortable with discharge.    # In the event of worsening symptoms, the patient is instructed to call the crisis hotline (988), 911 and or go to the nearest ED for appropriate evaluation and treatment of symptoms. To follow-up with primary care provider for other medical issues, concerns and or health care needs   # Patient was discharged with a plan to follow up as noted above.   Signed: Starleen Blue, NP 05/25/2022, 11:47 AM

## 2022-05-25 NOTE — Progress Notes (Signed)
  [facility] Adult Case Management Discharge Plan :  Will you be returning to the same living situation after discharge:  No. Patient has been accepted to [facility] door to door transfer.  At discharge, do you have transportation home?: Yes,  Patient to be transported to facility directly by family.  Do you have the ability to pay for your medications: Yes,  [employer].   Release of information consent forms completed and in the chart;  Patient's signature needed at discharge.  Patient to Follow up at:  Follow-up Information     [facility] Go on 05/25/2022.   Why: Please present for scheduled intake appointment on 05/25/2022 at 1100 AM. Contact information: [address] [place] [address] [phone]                Next level of care provider has access to [facility] Link:no  Safety Planning and Suicide Prevention discussed: Yes,  SPE completed with patient and Thayer Headings, partner.   Has patient been referred to the Quitline?: N/A patient is not a smoker Tobacco Use: Low Risk    Smoking Tobacco Use: Never   Smokeless Tobacco Use: Never   Passive Exposure: Not on file    Patient has been referred for addiction treatment: Yes Patient has been accepted to [facility] door to door transfer.   Corky Crafts, LCSWA 05/25/2022, 8:53 AM

## 2022-05-25 NOTE — Progress Notes (Signed)
Patient discharged this morning to [facility]. Discharge instructions reviewed with patient. Patient verbalized understanding. Patient received hard copy prescriptions and belonging from the locker. Patient reported that he thought there was a silver cross that went with his silver colored chain. No silver cross was documented on the belongings sheet, only a silver colored chain. Silver colored chain along with all other belongings returned to patient. Patient left unit at 1044. At discharge patient denies SI/HI/AVH.

## 2022-05-25 NOTE — Progress Notes (Signed)
   05/24/22 2100  Psych Admission Type (Psych Patients Only)  Admission Status Involuntary  Psychosocial Assessment  Patient Complaints Anxiety;Insomnia;Substance abuse  Eye Contact Fair  Facial Expression Anxious  Affect Anxious  Speech Logical/coherent  Interaction Assertive  Motor Activity Tremors  Appearance/Hygiene Unremarkable  Behavior Characteristics Cooperative;Anxious  Mood Anxious;Pleasant  Thought Process  Coherency WDL  Content WDL  Delusions None reported or observed  Perception WDL  Hallucination None reported or observed  Judgment Limited  Confusion None  Danger to Self  Current suicidal ideation? Denies  Agreement Not to Harm Self Yes  Description of Agreement VERBAL  Danger to Others  Danger to Others None reported or observed  Danger to Others Abnormal  Harmful Behavior to others No threats or harm toward other people   Patient compliant with medications denies SI/HI/A/VH. Q 15 minutes safety checks ongoing without self harm gestures.

## 2022-05-25 NOTE — Group Note (Signed)
Date:  05/25/2022 Time:  9:51 AM  Group Topic/Focus:  Orientation:   The focus of this group is to educate the patient on the purpose and policies of crisis stabilization and provide a format to answer questions about their admission.  The group details unit policies and expectations of patients while admitted.    Participation Level:  Active  Participation Quality:  Appropriate  Affect:  Anxious  Cognitive:  Appropriate  Insight: Appropriate  Engagement in Group:  Engaged  Modes of Intervention:  Discussion  Additional Comments:    Jaquita Rector 05/25/2022, 9:51 AM

## 2022-05-25 NOTE — [facility] Suicide Risk Assessment (Cosign Needed Addendum)
Suicide Risk Assessment  Discharge Assessment    [facility]   Principal Problem: MDD (major depressive disorder), recurrent episode, severe (HCC) Discharge Diagnoses: Principal Problem:   MDD (major depressive disorder), recurrent episode, severe (HCC) Active Problems:   Alcohol use disorder, severe, dependence (HCC)  Reason For Admission: Keith Miller is a 51 year old male [facility] intoxication and withdrawal symptoms. [facility], intoxication and dependence.  Patient has psychiatric history of MDD, (major depressive d/o), recurrent episode, severe, Alcohol use d/o, severe, dependence, Alcohol w/d syndrome with complications, Alcohol W/D, SI, Anxiety D/O.  He is currently separated from girlfriend and moved in to live with his 52 years old father. As per admissions assessment, alcohol intoxication led to pt falling on his elderly father.                                   HOSPITAL COURSE During the patient's hospitalization, patient had extensive initial psychiatric evaluation, and follow-up psychiatric evaluations every day.  Diagnoses provided & medications started upon initial assessment were as follows: -Started lexapro 5 mg once daily on 5-25, then increase to 10 mg once daily thereafter -Initiated Ativan Detox Protocol for alcohol W/D symptoms -Initiated MVI for supplement -Initiated Thiamine tablet 100 mg po daily for supplement -Initiated Folic Acid 1 mg po daily for supplement -Initiated Pantoprazole EC tablet 40 mg po daily -Initiated Propranolol 10 mg po Q 12 hours for anxiety and BP -Initiated Lip balm ointment for dry lips. -Initiated Hydroxyzine 25 mg every 6 hours PRN -Initiated Nicotine Replacement Protocol  During the hospitalization, pt completed the Ativan detox protocol for alcohol abuse.  Other adjustments were made to the  patient's medication regimen with final medications at discharge being as follows:  -Continue lexapro 10mg  daily -Continue Propranolol 10 mg po Q 12 hours for anxiety -Continue Hydroxyzine 25 mg every 6 hours PRN -Continue Inderal 10 mg BID for GERD  Patient's care was discussed during the interdisciplinary team meeting every day during the hospitalization. The patient denies having side effects to prescribed psychiatric medication. The patient was evaluated each day by a clinical provider to ascertain response to treatment. Improvement was noted by the patient's report of decreasing symptoms, improved sleep and appetite, affect, medication tolerance, behavior, and participation in unit programming.  Patient was asked each day to complete a self inventory noting mood, mental status, pain, new symptoms, anxiety and concerns.    Symptoms were reported as significantly decreased or resolved completely by discharge. On day of discharge, the patient reports that their mood is stable. The patient denied having suicidal thoughts for more than 48 hours prior to discharge.  Patient denies having homicidal thoughts.  Patient denies having auditory hallucinations.  Patient denies any visual hallucinations or other symptoms of psychosis. The patient was motivated to continue taking medication with a goal of continued improvement in mental health.   The patient reports their target psychiatric symptoms of depression, anxiety & alcohol withdrawal symptoms responded well to the psychiatric medications, and the patient reports overall benefit other psychiatric hospitalization. Supportive psychotherapy was provided to the patient. The patient also participated in regular group therapy while hospitalized. Coping skills, problem solving as well as relaxation therapies were also part of the unit programming.  Labs were reviewed with the patient, and abnormal  results were discussed with the patient.  Total Time spent with  patient: 30 minutes  Musculoskeletal: Strength & Muscle Tone: within normal limits Gait & Station: normal Patient leans: N/A  Psychiatric Specialty Exam  Presentation  General Appearance: Appropriate for Environment; Casual; Well Groomed  Eye Contact:Good  Speech:Clear and Coherent; Normal Rate  Speech Volume:Normal  Handedness:Right  Mood and Affect  Mood:Anxious  Duration of Depression Symptoms: Greater than two weeks  Affect:Congruent  Thought Process  Thought Processes:Goal Directed  Descriptions of Associations:Intact  Orientation:Full (Time, Place and Person)  Thought Content:Logical; WDL  History of Schizophrenia/Schizoaffective disorder:No  Duration of Psychotic Symptoms:No data recorded Hallucinations:Hallucinations: None  Ideas of Reference:None  Suicidal Thoughts:Suicidal Thoughts: No  Homicidal Thoughts:Homicidal Thoughts: No  Sensorium  Memory:Immediate Fair; Recent Fair; Remote Fair  Judgment:Fair  Insight:Fair  Executive Functions  Concentration:Fair  Attention Span:Fair  Recall:Fair  Fund of Knowledge:Fair  Language:Good  Psychomotor Activity  Psychomotor Activity:Psychomotor Activity: Normal  Assets  Assets:Communication Skills; Physical Health; Social Support  Sleep  Sleep:Sleep: Fair  Physical Exam: Physical Exam Constitutional:      Appearance: Normal appearance.  HENT:     Head: Normocephalic.     Nose: Nose normal. No congestion or rhinorrhea.  Eyes:     Pupils: Pupils are equal, round, and reactive to light.  Pulmonary:     Effort: Pulmonary effort is normal.  Musculoskeletal:        General: Normal range of motion.     Cervical back: Normal range of motion.  Neurological:     Mental Status: He is alert and oriented to person, place, and time.     Sensory: No sensory deficit.     Coordination: Coordination normal.  Psychiatric:        Behavior: Behavior normal.   Review of Systems  Constitutional:  Negative.  Negative for fever.  HENT: Negative.  Negative for sore throat.   Eyes: Negative.   Respiratory: Negative.  Negative for cough.   Cardiovascular: Negative.  Negative for chest pain.  Gastrointestinal: Negative.  Negative for heartburn.  Genitourinary: Negative.   Musculoskeletal: Negative.   Skin: Negative.   Neurological: Negative.   Psychiatric/Behavioral:  Positive for depression (Patient reports that his depressive symptoms are resolving. He denies SI/HI/AVH and verbally contracts for safety outside of this [facility]) and substance abuse (alcohol dependence). Negative for hallucinations, memory loss and suicidal ideas. Nervous/anxious: pt reports that anxiety is resolving on current medications. Insomnia: sleep is improving with current meds.   Blood pressure (!) 124/93, pulse (!) 102, temperature 98.1 F (36.7 C), temperature source Oral, resp. rate 16, height 6' (1.829 m), weight 86.9 kg, SpO2 100 %. Body mass index is 25.99 kg/m.  Mental Status Per Nursing Assessment::   On Admission:  NA  Demographic Factors:  Male  Loss Factors: NA  Historical Factors: Family history of mental illness or substance abuse  Risk Reduction Factors:   Sense of responsibility to family and Positive social support  Continued Clinical Symptoms:  Alcohol/Substance Abuse/Dependencies  Cognitive Features That Contribute To Risk:  None    Suicide Risk:  Minimal: No identifiable suicidal ideation.  Patients presenting with no risk factors but with morbid ruminations; may be classified as minimal risk based on the severity of the depressive symptoms   Follow-up Information     [facility] [facility] Go on 05/25/2022.   Why: Please present for scheduled intake appointment on 05/25/2022 at 1100 AM. Contact information: [address] [place] [address] [phone]  Plan Of Care/Follow-up recommendations:   The patient is able to verbalize their individual  safety plan to this provider.  # It is recommended to the patient to continue psychiatric medications as prescribed, after discharge from the hospital.    # It is recommended to the patient to follow up with your outpatient psychiatric provider and PCP.  # It was discussed with the patient, the impact of alcohol, drugs, tobacco have been there overall psychiatric and medical wellbeing, and total abstinence from substance use was recommended the patient.ed.  # Prescriptions provided or sent directly to preferred pharmacy at discharge. Patient agreeable to plan. Given opportunity to ask questions. Appears to feel comfortable with discharge.    # In the event of worsening symptoms, the patient is instructed to call the crisis hotline (988), 911 and or go to the nearest ED for appropriate evaluation and treatment of symptoms. To follow-up with primary care provider for other medical issues, concerns and or health care needs  # Patient was discharged with a plan to follow up as noted above.   Starleen Blue, NP 05/25/2022, 8:56 AM

## 2022-07-09 IMAGING — DX DG CHEST 1V PORT
2 series · 2 of 2 positions shown · non-contrast
Comparison: Chest radiograph dated 12/05/2021.

CLINICAL DATA: Shortness of breath.

EXAM:
PORTABLE CHEST 1 VIEW

[chest ap (1 of 2)]
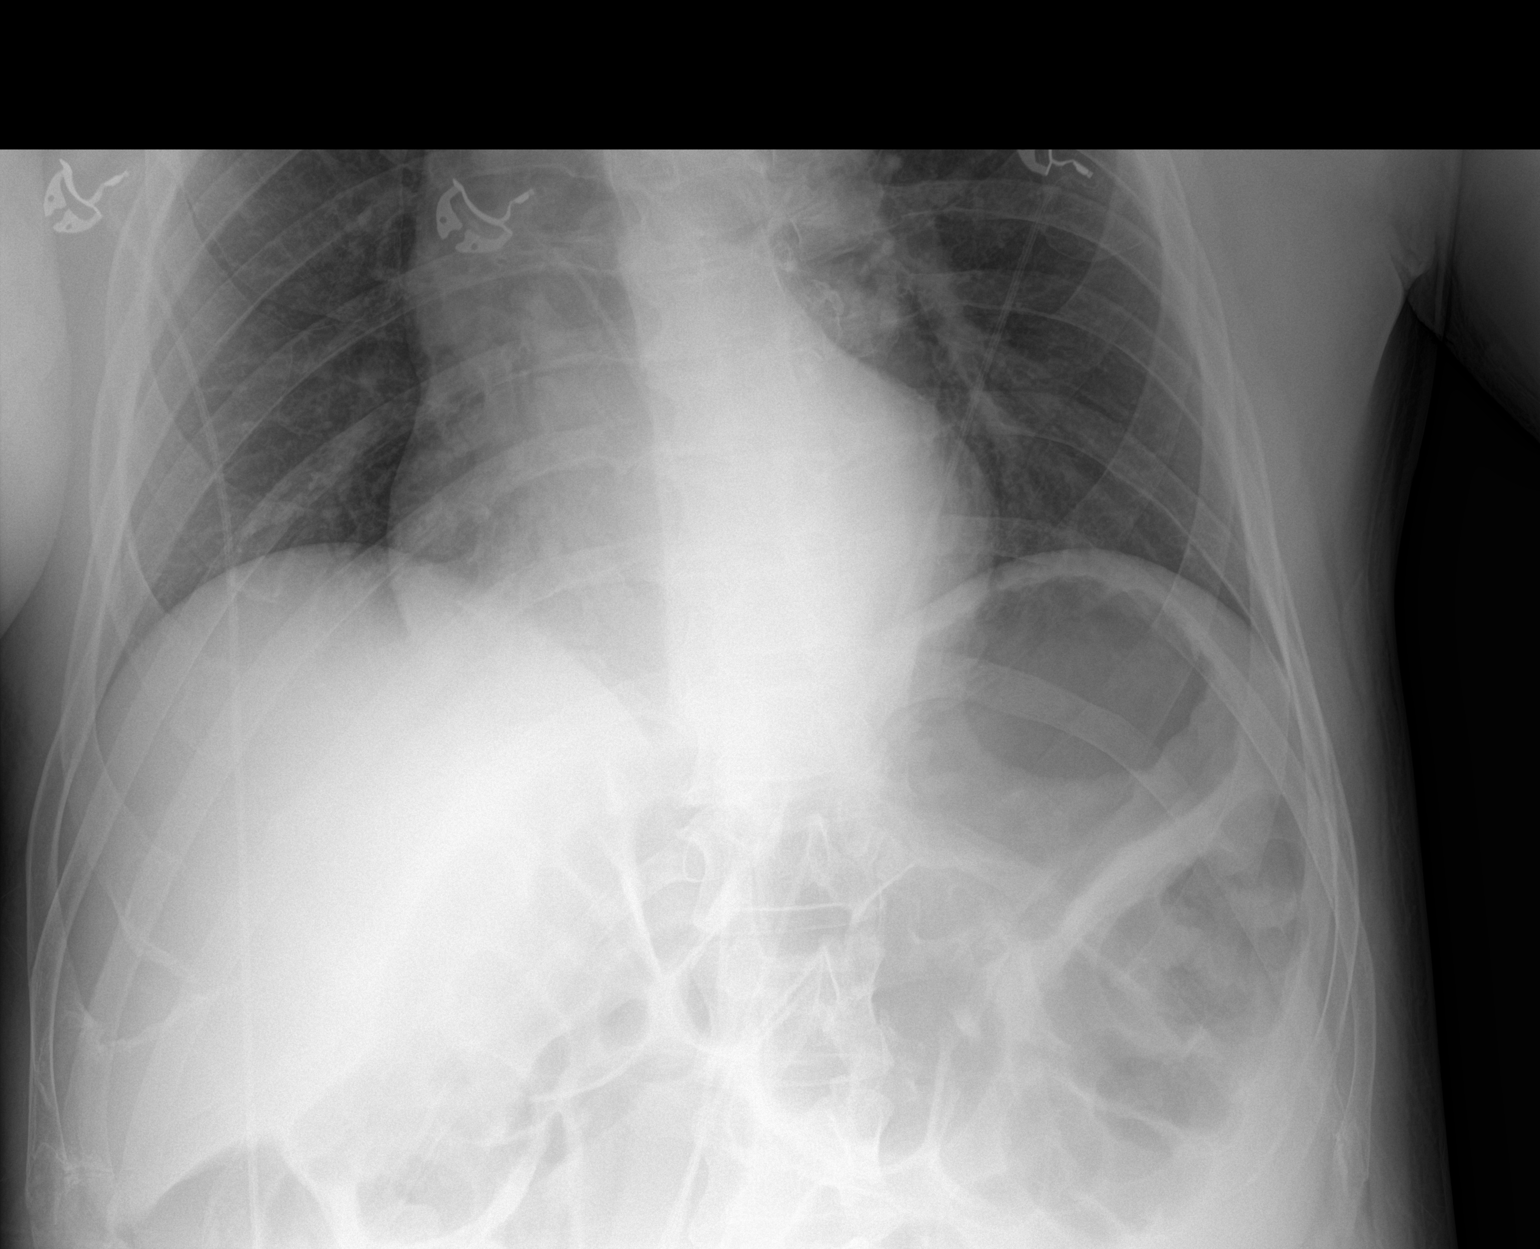

[chest ap (2 of 2)]
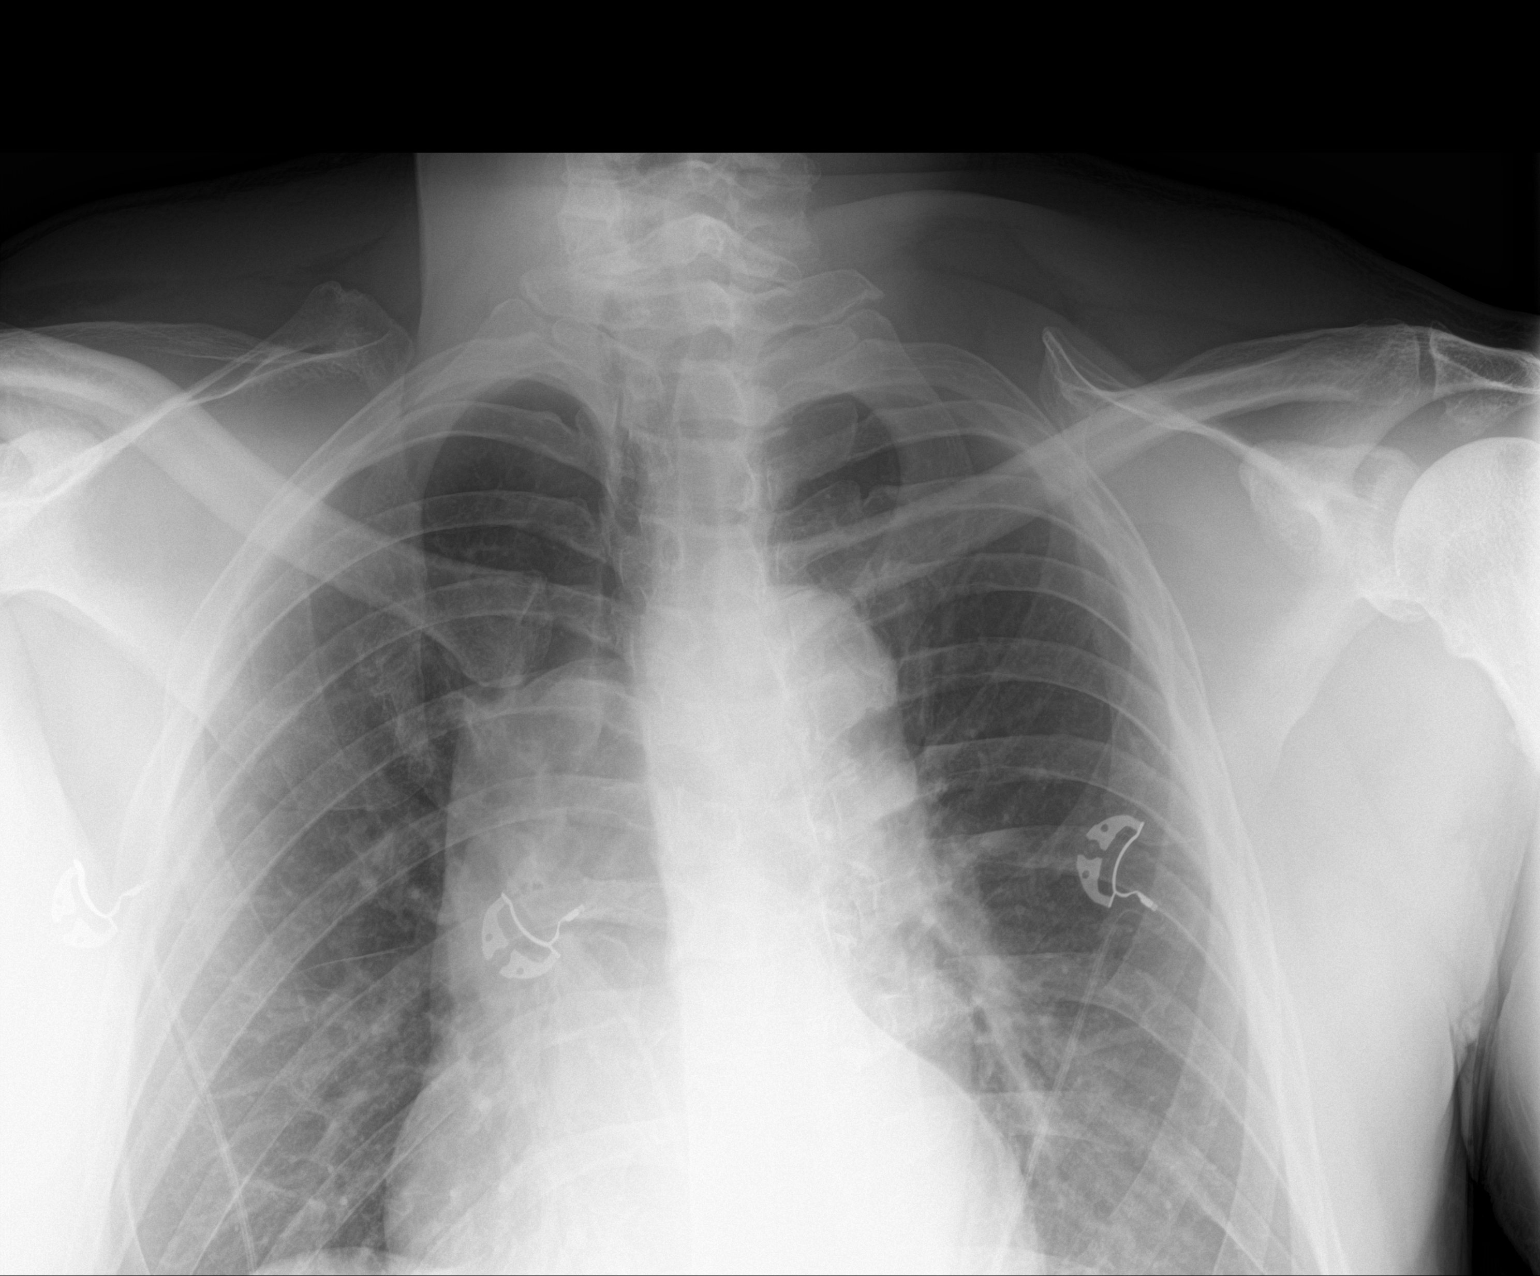

[2 of 2 positions shown; findings below may reference images not displayed]

FINDINGS: No focal consolidation, pleural effusion, pneumothorax. The cardiac
silhouette is within limits. There is persistent widening of the
upper mediastinum similar to prior radiograph. This may be
projectional. However, underlying aortic aneurysm is not excluded.
CT may provide better evaluation. No acute osseous pathology.
IMPRESSION: 1. No acute cardiopulmonary process.
2. Persistent widening of the upper mediastinum.

## 2022-07-09 IMAGING — CT CT ANGIO CHEST
2 of 6 series · 18 of 36 positions shown · IV contrast (OMNIPAQUE 350)
Comparison: Chest x-ray May 20, 2022.

CLINICAL DATA: High probability for pulmonary embolus. Shortness of
breath.

EXAM:
CT ANGIOGRAPHY CHEST WITH CONTRAST
TECHNIQUE: Multidetector CT imaging of the chest was performed using the
standard protocol during bolus administration of intravenous
contrast. Multiplanar CT image reconstructions and MIPs were
obtained to evaluate the vascular anatomy.

[Series 5: thins · axial · 0.77mm/px · z∈[-375,-66]mm · 17 of 349 slices shown]
[im 20/349  lung]
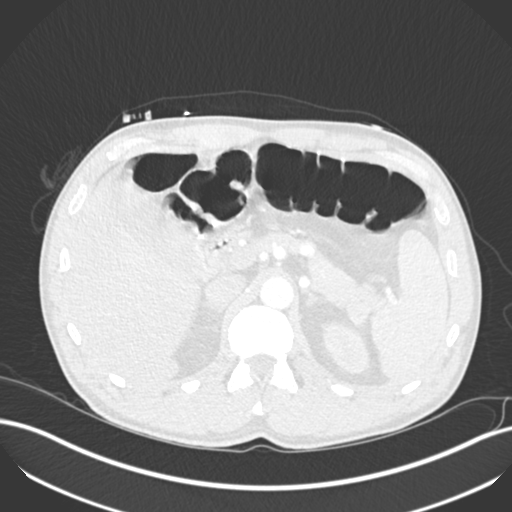
[im 39/349  mediastinal]
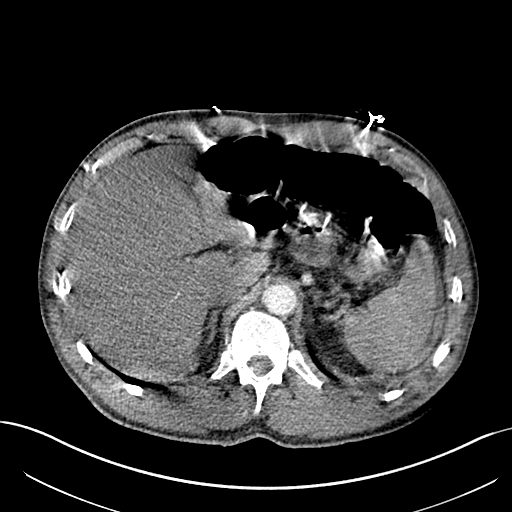
[im 59/349  lung]
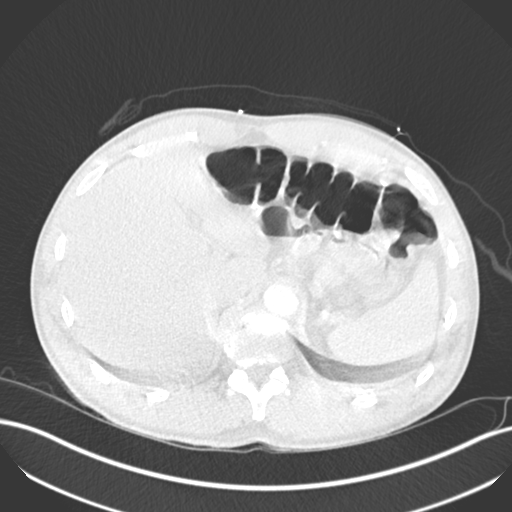
[im 78/349  mediastinal]
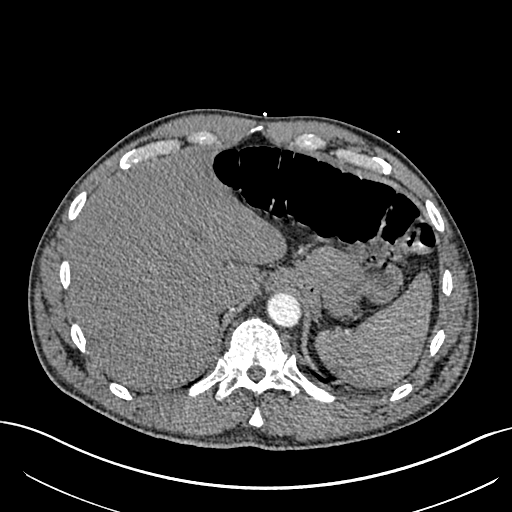
[im 97/349  lung]
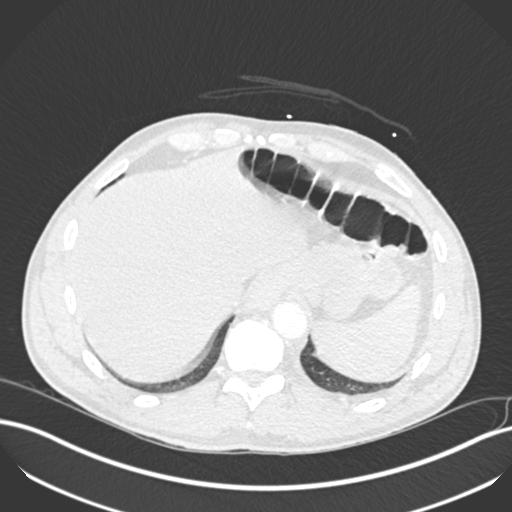
[im 117/349  mediastinal]
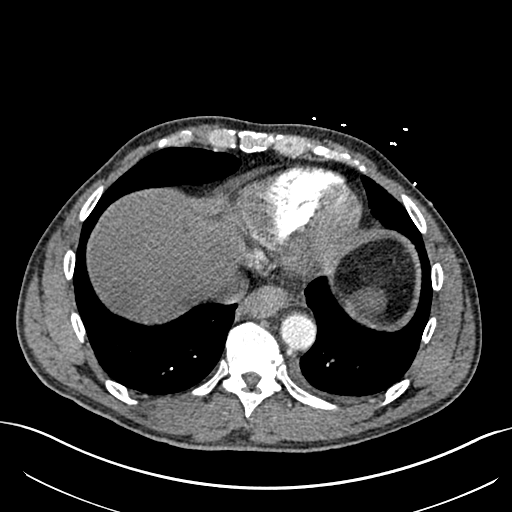
[im 136/349  lung]
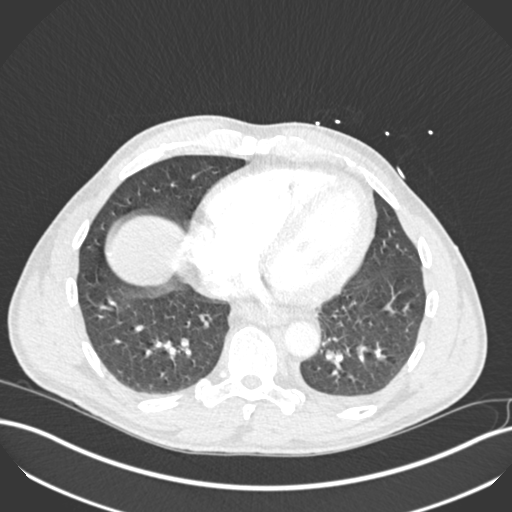
[im 155/349  mediastinal]
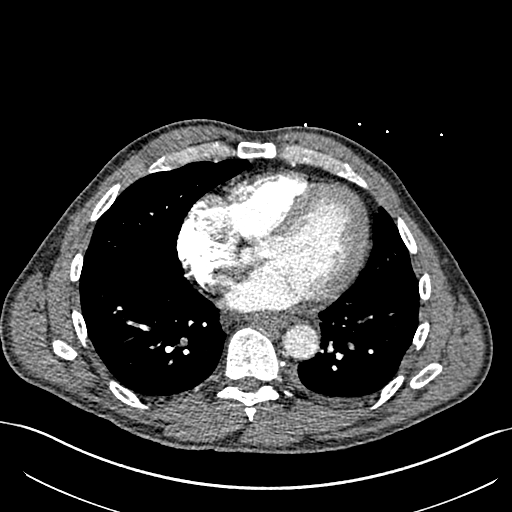
[im 175/349  lung]
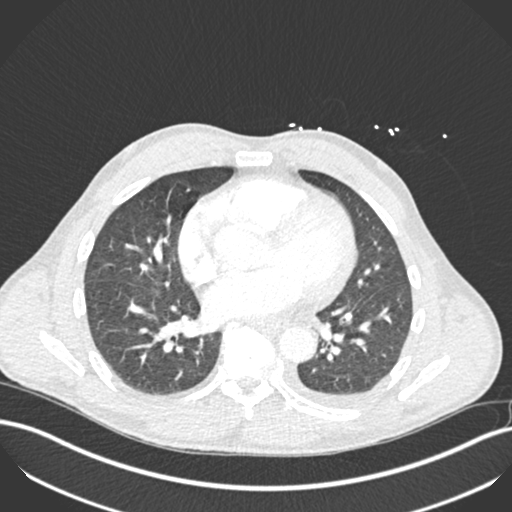
[im 194/349  mediastinal]
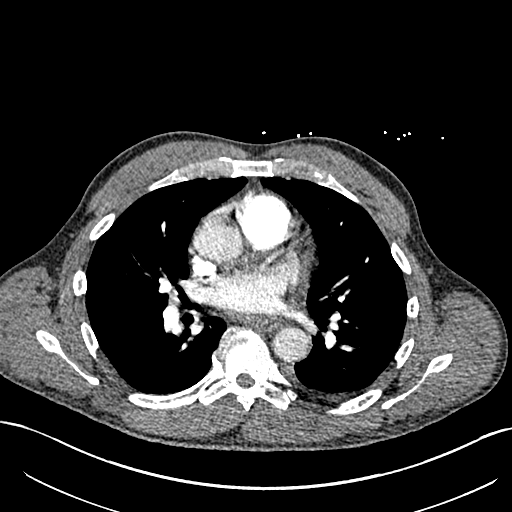
[im 213/349  lung]
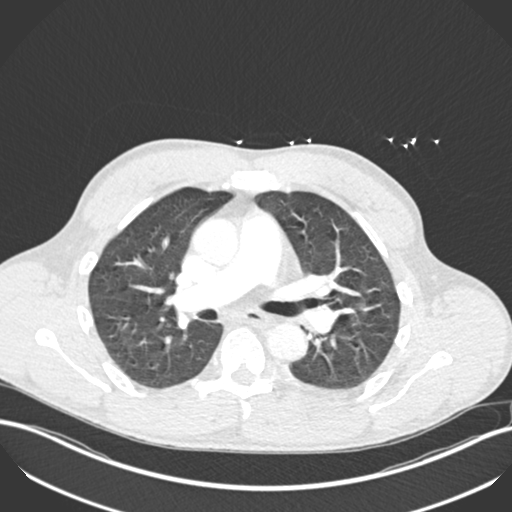
[im 233/349  mediastinal]
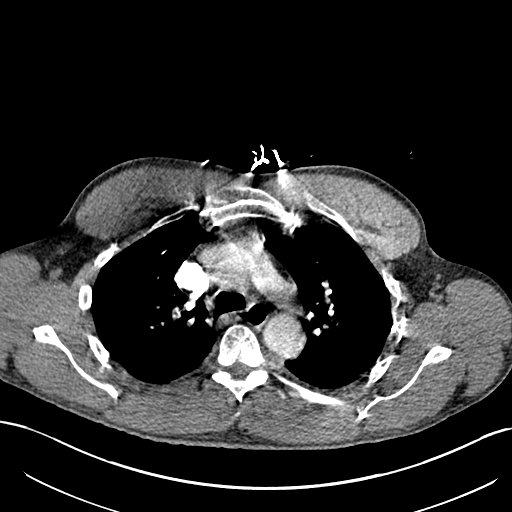
[im 252/349  lung]
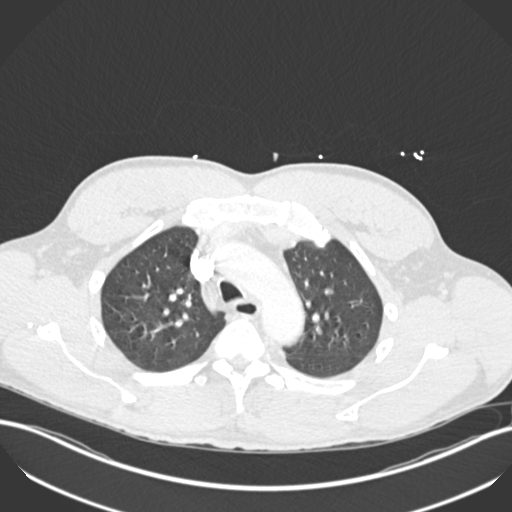
[im 271/349  mediastinal]
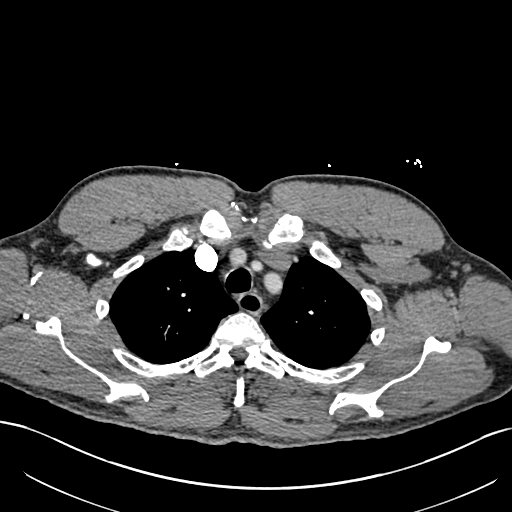
[im 291/349  lung]
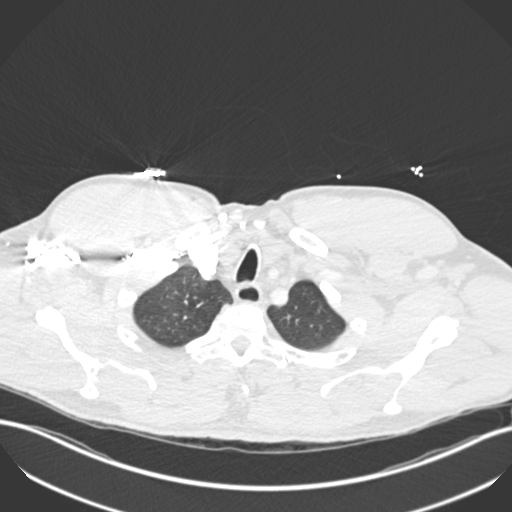
[im 310/349  mediastinal]
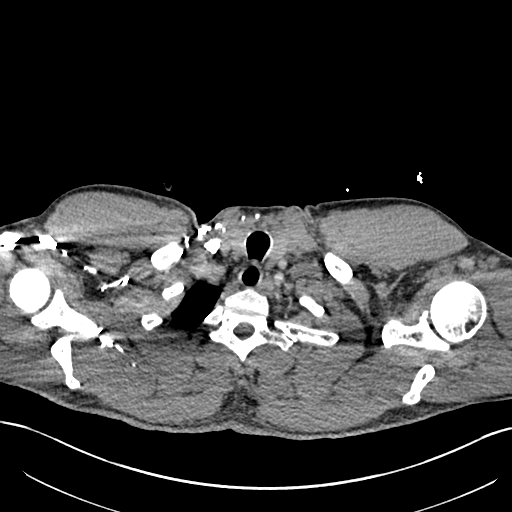
[im 329/349  lung]
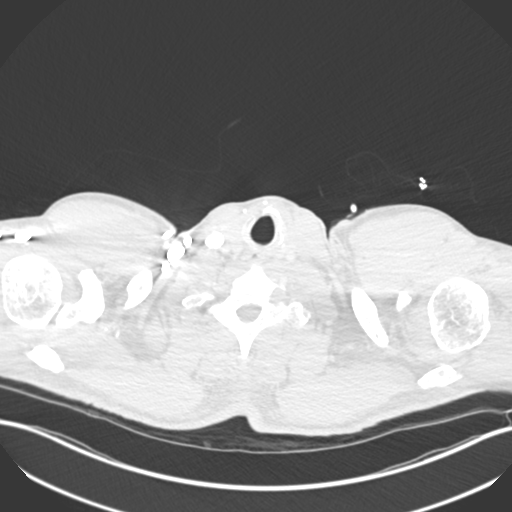

[Series 7: coronal mpr · coronal · 0.71mm/px · 1 of 137 slices shown]
[im 69/137  mediastinal]
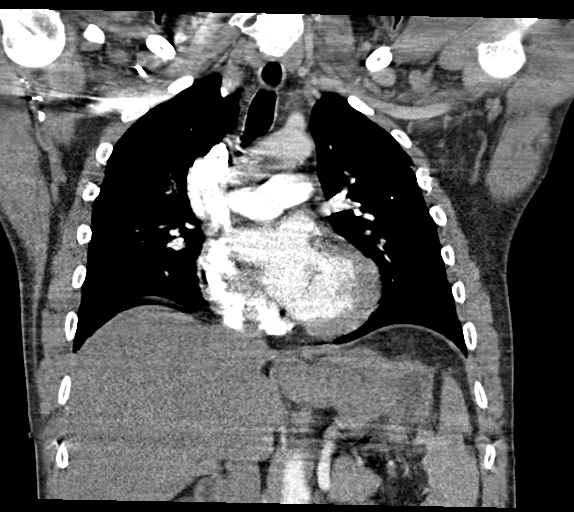

[18 of 36 positions shown; findings below may reference images not displayed]

RADIATION DOSE REDUCTION: This exam was performed according to the
departmental dose-optimization program which includes automated
exposure control, adjustment of the mA and/or kV according to
patient size and/or use of iterative reconstruction technique.

CONTRAST:  75mL OMNIPAQUE IOHEXOL 350 MG/ML SOLN
FINDINGS: Cardiovascular: Motion limits evaluation for pulmonary emboli,
particularly peripherally with bilateral stairstep artifact,
particularly in the bases. Within these limitations, no emboli are
identified. The heart and thoracic aorta are unremarkable.

Mediastinum/Nodes: There is a tiny left pleural effusion. There is
mild increased attenuation in the fat adjacent to the esophagus.
Mild distal esophageal thickening not excluded. The remainder of the
esophagus is normal. No abnormal air in the mediastinum. The thyroid
is normal. No adenopathy. The chest wall is normal.

Lungs/Pleura: Central airways are normal. No pneumothorax.
Evaluation limited due to motion. Within these limitations, there is
no evidence of pneumonia or aspiration. No nodules or masses
identified.

Upper Abdomen: No acute abnormality.

Musculoskeletal: No chest wall abnormality. No acute or significant
osseous findings.

Review of the MIP images confirms the above findings.
IMPRESSION: 1. The study is limited due to respiratory motion.
2. No central pulmonary emboli identified. Evaluation for emboli
more peripherally is markedly limited due to respiratory motion. No
emboli noted.
3. There is apparent mild increased attenuation in the fat adjacent
to the distal esophagus raising the possibility of inflammation such
as esophagitis. Esophageal thickening is not excluded. Recommend
clinical correlation. Evaluation of this region is somewhat limited
due to motion. If the clinical picture is ambiguous, endoscopy could
better evaluate the esophagus.
4. Tiny left pleural effusion, nonspecific.
5. No other abnormalities.
